# Patient Record
Sex: Female | Born: 2000
Health system: Southern US, Community
[De-identification: ages and names within clinical notes are randomized; demographics above are authoritative.]

## PROBLEM LIST (undated history)

## (undated) ENCOUNTER — Inpatient Hospital Stay (HOSPITAL_COMMUNITY): Payer: Self-pay

## (undated) DIAGNOSIS — G43909 Migraine, unspecified, not intractable, without status migrainosus: Secondary | ICD-10-CM

## (undated) DIAGNOSIS — R55 Syncope and collapse: Secondary | ICD-10-CM

## (undated) DIAGNOSIS — F419 Anxiety disorder, unspecified: Secondary | ICD-10-CM

## (undated) DIAGNOSIS — Z9889 Other specified postprocedural states: Secondary | ICD-10-CM

## (undated) DIAGNOSIS — F32A Depression, unspecified: Secondary | ICD-10-CM

## (undated) DIAGNOSIS — I498 Other specified cardiac arrhythmias: Secondary | ICD-10-CM

## (undated) DIAGNOSIS — F410 Panic disorder [episodic paroxysmal anxiety] without agoraphobia: Secondary | ICD-10-CM

## (undated) DIAGNOSIS — J45909 Unspecified asthma, uncomplicated: Secondary | ICD-10-CM

## (undated) DIAGNOSIS — G90A Postural orthostatic tachycardia syndrome (POTS): Secondary | ICD-10-CM

## (undated) DIAGNOSIS — F988 Other specified behavioral and emotional disorders with onset usually occurring in childhood and adolescence: Secondary | ICD-10-CM

## (undated) DIAGNOSIS — H539 Unspecified visual disturbance: Secondary | ICD-10-CM

## (undated) DIAGNOSIS — E282 Polycystic ovarian syndrome: Secondary | ICD-10-CM

## (undated) DIAGNOSIS — F909 Attention-deficit hyperactivity disorder, unspecified type: Secondary | ICD-10-CM

## (undated) HISTORY — DX: Migraine, unspecified, not intractable, without status migrainosus: G43.909

## (undated) HISTORY — PX: ROUX-EN-Y GASTRIC BYPASS: SHX1104

## (undated) HISTORY — PX: ESOPHAGOGASTRODUODENOSCOPY: SHX1529

## (undated) HISTORY — DX: Other specified behavioral and emotional disorders with onset usually occurring in childhood and adolescence: F98.8

## (undated) HISTORY — PX: OVARIAN CYST SURGERY: SHX726

## (undated) HISTORY — DX: Syncope and collapse: R55

---
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
## 2018-08-29 DIAGNOSIS — Z23 Encounter for immunization: Secondary | ICD-10-CM | POA: Diagnosis not present

## 2018-09-30 DIAGNOSIS — R062 Wheezing: Secondary | ICD-10-CM | POA: Diagnosis not present

## 2018-09-30 DIAGNOSIS — R05 Cough: Secondary | ICD-10-CM | POA: Diagnosis not present

## 2018-09-30 DIAGNOSIS — J309 Allergic rhinitis, unspecified: Secondary | ICD-10-CM | POA: Diagnosis not present

## 2018-12-22 DIAGNOSIS — Z68.41 Body mass index (BMI) pediatric, greater than or equal to 95th percentile for age: Secondary | ICD-10-CM | POA: Diagnosis not present

## 2018-12-22 DIAGNOSIS — E282 Polycystic ovarian syndrome: Secondary | ICD-10-CM | POA: Diagnosis not present

## 2018-12-22 DIAGNOSIS — R635 Abnormal weight gain: Secondary | ICD-10-CM | POA: Diagnosis not present

## 2019-03-23 DIAGNOSIS — J45909 Unspecified asthma, uncomplicated: Secondary | ICD-10-CM | POA: Insufficient documentation

## 2019-05-25 DIAGNOSIS — Z7689 Persons encountering health services in other specified circumstances: Secondary | ICD-10-CM | POA: Diagnosis not present

## 2019-05-25 DIAGNOSIS — R9431 Abnormal electrocardiogram [ECG] [EKG]: Secondary | ICD-10-CM | POA: Diagnosis not present

## 2019-05-25 DIAGNOSIS — Z6841 Body Mass Index (BMI) 40.0 and over, adult: Secondary | ICD-10-CM | POA: Diagnosis not present

## 2019-05-25 DIAGNOSIS — J45909 Unspecified asthma, uncomplicated: Secondary | ICD-10-CM | POA: Diagnosis not present

## 2019-05-25 DIAGNOSIS — Z7189 Other specified counseling: Secondary | ICD-10-CM | POA: Diagnosis not present

## 2019-05-25 DIAGNOSIS — Z713 Dietary counseling and surveillance: Secondary | ICD-10-CM | POA: Diagnosis not present

## 2019-05-25 DIAGNOSIS — Z01818 Encounter for other preprocedural examination: Secondary | ICD-10-CM | POA: Diagnosis not present

## 2019-05-25 DIAGNOSIS — Z79899 Other long term (current) drug therapy: Secondary | ICD-10-CM | POA: Diagnosis not present

## 2019-05-25 DIAGNOSIS — E282 Polycystic ovarian syndrome: Secondary | ICD-10-CM | POA: Diagnosis not present

## 2019-05-25 DIAGNOSIS — Z7984 Long term (current) use of oral hypoglycemic drugs: Secondary | ICD-10-CM | POA: Diagnosis not present

## 2019-06-01 DIAGNOSIS — Z713 Dietary counseling and surveillance: Secondary | ICD-10-CM | POA: Diagnosis not present

## 2019-06-01 DIAGNOSIS — F4320 Adjustment disorder, unspecified: Secondary | ICD-10-CM | POA: Diagnosis not present

## 2019-06-01 DIAGNOSIS — Z6841 Body Mass Index (BMI) 40.0 and over, adult: Secondary | ICD-10-CM | POA: Diagnosis not present

## 2019-07-20 DIAGNOSIS — Z01818 Encounter for other preprocedural examination: Secondary | ICD-10-CM | POA: Diagnosis not present

## 2019-07-20 DIAGNOSIS — Z1159 Encounter for screening for other viral diseases: Secondary | ICD-10-CM | POA: Diagnosis not present

## 2019-07-20 DIAGNOSIS — Z5329 Procedure and treatment not carried out because of patient's decision for other reasons: Secondary | ICD-10-CM | POA: Diagnosis not present

## 2019-07-23 DIAGNOSIS — Z7984 Long term (current) use of oral hypoglycemic drugs: Secondary | ICD-10-CM | POA: Diagnosis not present

## 2019-07-23 DIAGNOSIS — K2950 Unspecified chronic gastritis without bleeding: Secondary | ICD-10-CM | POA: Diagnosis not present

## 2019-07-23 DIAGNOSIS — Z01818 Encounter for other preprocedural examination: Secondary | ICD-10-CM | POA: Diagnosis not present

## 2019-07-23 DIAGNOSIS — E282 Polycystic ovarian syndrome: Secondary | ICD-10-CM | POA: Diagnosis not present

## 2019-07-23 DIAGNOSIS — Z79899 Other long term (current) drug therapy: Secondary | ICD-10-CM | POA: Diagnosis not present

## 2019-08-10 DIAGNOSIS — G4730 Sleep apnea, unspecified: Secondary | ICD-10-CM | POA: Diagnosis not present

## 2019-08-10 DIAGNOSIS — Z79899 Other long term (current) drug therapy: Secondary | ICD-10-CM | POA: Diagnosis not present

## 2019-08-10 DIAGNOSIS — R9431 Abnormal electrocardiogram [ECG] [EKG]: Secondary | ICD-10-CM | POA: Diagnosis not present

## 2019-08-10 DIAGNOSIS — I252 Old myocardial infarction: Secondary | ICD-10-CM | POA: Diagnosis not present

## 2019-08-10 DIAGNOSIS — E538 Deficiency of other specified B group vitamins: Secondary | ICD-10-CM | POA: Diagnosis not present

## 2019-08-10 DIAGNOSIS — K2950 Unspecified chronic gastritis without bleeding: Secondary | ICD-10-CM | POA: Diagnosis not present

## 2019-08-10 DIAGNOSIS — E559 Vitamin D deficiency, unspecified: Secondary | ICD-10-CM | POA: Diagnosis not present

## 2019-08-10 DIAGNOSIS — Z01818 Encounter for other preprocedural examination: Secondary | ICD-10-CM | POA: Diagnosis not present

## 2019-08-10 DIAGNOSIS — Z713 Dietary counseling and surveillance: Secondary | ICD-10-CM | POA: Diagnosis not present

## 2019-08-10 DIAGNOSIS — Z6841 Body Mass Index (BMI) 40.0 and over, adult: Secondary | ICD-10-CM | POA: Diagnosis not present

## 2019-08-18 DIAGNOSIS — Z20828 Contact with and (suspected) exposure to other viral communicable diseases: Secondary | ICD-10-CM | POA: Diagnosis not present

## 2019-08-18 DIAGNOSIS — Z01818 Encounter for other preprocedural examination: Secondary | ICD-10-CM | POA: Diagnosis not present

## 2019-08-21 DIAGNOSIS — Z79899 Other long term (current) drug therapy: Secondary | ICD-10-CM | POA: Diagnosis not present

## 2019-08-21 DIAGNOSIS — R339 Retention of urine, unspecified: Secondary | ICD-10-CM | POA: Diagnosis not present

## 2019-08-21 DIAGNOSIS — Z793 Long term (current) use of hormonal contraceptives: Secondary | ICD-10-CM | POA: Diagnosis not present

## 2019-08-21 DIAGNOSIS — Z6841 Body Mass Index (BMI) 40.0 and over, adult: Secondary | ICD-10-CM | POA: Diagnosis not present

## 2019-08-21 DIAGNOSIS — J45909 Unspecified asthma, uncomplicated: Secondary | ICD-10-CM | POA: Diagnosis not present

## 2019-08-21 DIAGNOSIS — Z7984 Long term (current) use of oral hypoglycemic drugs: Secondary | ICD-10-CM | POA: Diagnosis not present

## 2019-08-21 DIAGNOSIS — Z01818 Encounter for other preprocedural examination: Secondary | ICD-10-CM | POA: Diagnosis not present

## 2019-08-21 DIAGNOSIS — E282 Polycystic ovarian syndrome: Secondary | ICD-10-CM | POA: Diagnosis not present

## 2019-09-10 DIAGNOSIS — Z23 Encounter for immunization: Secondary | ICD-10-CM | POA: Diagnosis not present

## 2019-09-14 DIAGNOSIS — F4320 Adjustment disorder, unspecified: Secondary | ICD-10-CM | POA: Diagnosis not present

## 2019-09-14 DIAGNOSIS — Z6841 Body Mass Index (BMI) 40.0 and over, adult: Secondary | ICD-10-CM | POA: Diagnosis not present

## 2019-09-14 DIAGNOSIS — E282 Polycystic ovarian syndrome: Secondary | ICD-10-CM | POA: Diagnosis not present

## 2019-09-14 DIAGNOSIS — Z713 Dietary counseling and surveillance: Secondary | ICD-10-CM | POA: Diagnosis not present

## 2019-09-14 DIAGNOSIS — Z9884 Bariatric surgery status: Secondary | ICD-10-CM | POA: Diagnosis not present

## 2019-09-14 DIAGNOSIS — Z48815 Encounter for surgical aftercare following surgery on the digestive system: Secondary | ICD-10-CM | POA: Diagnosis not present

## 2019-09-29 DIAGNOSIS — Z309 Encounter for contraceptive management, unspecified: Secondary | ICD-10-CM | POA: Diagnosis not present

## 2019-10-27 DIAGNOSIS — Z01419 Encounter for gynecological examination (general) (routine) without abnormal findings: Secondary | ICD-10-CM | POA: Diagnosis not present

## 2019-10-27 DIAGNOSIS — R55 Syncope and collapse: Secondary | ICD-10-CM | POA: Diagnosis not present

## 2019-11-13 DIAGNOSIS — Z79899 Other long term (current) drug therapy: Secondary | ICD-10-CM | POA: Diagnosis not present

## 2019-11-13 DIAGNOSIS — Z713 Dietary counseling and surveillance: Secondary | ICD-10-CM | POA: Diagnosis not present

## 2019-11-13 DIAGNOSIS — E669 Obesity, unspecified: Secondary | ICD-10-CM | POA: Diagnosis not present

## 2019-11-13 DIAGNOSIS — Z9884 Bariatric surgery status: Secondary | ICD-10-CM | POA: Diagnosis not present

## 2019-11-13 DIAGNOSIS — K912 Postsurgical malabsorption, not elsewhere classified: Secondary | ICD-10-CM | POA: Diagnosis not present

## 2019-11-13 DIAGNOSIS — Z48815 Encounter for surgical aftercare following surgery on the digestive system: Secondary | ICD-10-CM | POA: Diagnosis not present

## 2019-11-13 DIAGNOSIS — Z6837 Body mass index (BMI) 37.0-37.9, adult: Secondary | ICD-10-CM | POA: Diagnosis not present

## 2019-11-30 ENCOUNTER — Encounter: Payer: Self-pay | Admitting: *Deleted

## 2019-12-01 ENCOUNTER — Other Ambulatory Visit: Payer: Self-pay

## 2019-12-01 ENCOUNTER — Telehealth: Payer: Self-pay | Admitting: Cardiology

## 2019-12-01 ENCOUNTER — Ambulatory Visit (INDEPENDENT_AMBULATORY_CARE_PROVIDER_SITE_OTHER): Payer: BC Managed Care – PPO | Admitting: Cardiology

## 2019-12-01 ENCOUNTER — Encounter: Payer: Self-pay | Admitting: Cardiology

## 2019-12-01 VITALS — BP 94/58 | HR 64 | Ht 64.0 in | Wt 214.2 lb

## 2019-12-01 DIAGNOSIS — R002 Palpitations: Secondary | ICD-10-CM

## 2019-12-01 DIAGNOSIS — Z87898 Personal history of other specified conditions: Secondary | ICD-10-CM

## 2019-12-01 NOTE — Telephone Encounter (Signed)
Pre-cert Verification for the following procedure    14 Day ZIO AT dx: syncope & palpitations

## 2019-12-01 NOTE — Progress Notes (Signed)
Cardiology Office Note  Date: 12/01/2019   ID: [name], [name] [birth year], MRN [id number]  PCP:  Rory Percy, MD  Cardiologist:  Rozann Lesches, MD Electrophysiologist:  None   Chief Complaint  Patient presents with  . History of syncope    History of Present Illness: Vickie Sutton is a 18 y.o. female referred for cardiology consultation by Vickie Sutton for the evaluation of syncope.  We discussed her symptoms.  She tells me that about a year ago she was taking a hot shower, standing up and had an episode of syncope where she fell backward.  Since that time she has had other episodes of lightheadedness and frank syncope that are more typically associated with a feeling of palpitations and pounding in her head, also nausea, exclusively while standing.  She has never had a sudden unexplained episode of syncope without prodrome, never while being seated or lying down.  When she has these episodes standing, she typically feels better when she sits down after a few minutes, if she tries to keep standing she has had syncope.  Records indicate Vickie Sutton at [facility] in August of this year.  She states that she lost about 50 pounds so far.  She tells me that she has done well with regular diet, trying to stay well-hydrated, but it sounds like her blood pressure has come down somewhat over time, she was not hypertensive previously oral medications however.  She does not report any recurring loose stools or diarrhea.  She also tells me that she has a special needs brother in his 41s who was diagnosed with WPW as a child and underwent ablation.  No other known family history of cardiac arrhythmia.  No one with sudden cardiac death.  Orthostatic vital signs were obtained today and unremarkable.  Supine blood pressure 108/67 with heart rate 66, seated blood pressure 109/74 with heart rate 68, standing blood pressure 108/73 with heart rate 74, and standing blood pressure after 3 minutes  110/71 with heart rate 73.  She is a Psychologist, counselling, works at the [employer] in [place], an [place].  Past Medical History:  Diagnosis Date  . ADD (attention deficit disorder)   . Asthma   . Polycystic ovarian syndrome     Past Surgical History:  Procedure Laterality Date  . ESOPHAGOGASTRODUODENOSCOPY    . Vickie Sutton     August 2020 - [facility]  . TOOTH EXTRACTION N/A 12/12/2015   Procedure: EXTRACTION MOLARS - teeth one, sixteen, seventeen and thirty-two;  Surgeon: Diona Browner, DDS;  Location: [facility];  Service: Oral Surgery;  Laterality: N/A;    Current Outpatient Medications  Medication Sig Dispense Refill  . albuterol (PROAIR HFA) 108 (90 BASE) MCG/ACT inhaler Inhale 2 puffs into the lungs 4 (four) times daily as needed for wheezing or shortness of breath.    . Calcium Carbonate (CALCIUM 600 PO) Take 1 tablet by mouth daily.    . clindamycin (CLINDAGEL) 1 % gel Apply 1 application topically daily as needed (acne).    . Cyanocobalamin (VITAMIN B12 PO) Take 1 tablet by mouth daily.    . Ferrous Sulfate (IRON PO) Take 1 tablet by mouth daily.    . Multiple Vitamin (MULTIVITAMIN) tablet Take 1 tablet by mouth daily.    . norethindrone-ethinyl estradiol 1/35 (NORTREL 1/35, 28,) tablet TAKE 1 TABLET BY MOUTH ONCE DAILY.    . pantoprazole (PROTONIX) 40 MG tablet Take by mouth.  No current facility-administered medications for this visit.    Allergies:  Patient has no known allergies.   Social History: The patient  reports that she has never smoked. She has never used smokeless tobacco. She reports that she does not drink alcohol or use drugs.   Family History: The patient's family history includes Diabetes Mellitus II in her maternal grandmother; Obesity in her mother.   ROS:  Please see the history of present illness. Otherwise, complete review of systems is positive for none.  All other systems are reviewed and negative.   Physical Exam: VS:   BP (!) 94/58   Pulse 64   Ht 5\' 4"  (1.626 m)   Wt 214 lb 3.2 oz (97.2 kg)   SpO2 99%   BMI 36.77 kg/m , BMI Body mass index is 36.77 kg/m.  Wt Readings from Last 3 Encounters:  12/01/19 214 lb 3.2 oz (97.2 kg)  12/12/15 208 lb (94.3 kg) (98 %, Z= 2.15)*  12/06/15 208 lb 12.8 oz (94.7 kg) (98 %, Z= 2.16)*   * Growth percentiles are based on CDC (Girls, 2-20 Years) data.    General: Patient appears comfortable at rest. HEENT: Conjunctiva and lids normal, wearing a mask. Neck: Supple, no elevated JVP or carotid bruits, no thyromegaly. Lungs: Clear to auscultation, nonlabored breathing at rest. Cardiac: Regular rate and rhythm, no S3 or significant systolic murmur, no pericardial rub. Abdomen: Soft, nontender, bowel sounds present. Extremities: No pitting edema, distal pulses 2+. Skin: Warm and dry. Musculoskeletal: No kyphosis. Neuropsychiatric: Alert and oriented x3, affect grossly appropriate.  ECG:  An ECG dated November 2020 was personally reviewed today and demonstrated:  Normal sinus rhythm with increased voltage, repolarization changes, normal QT interval.  Recent Labwork:  December 2019: Hemoglobin A1c 5.4%, TSH 2.07, BUN 9, creatinine 0.68, potassium 4.4, AST 31, ALT 29, hemoglobin 14.1, platelets 279 August 2020: Hemoglobin 11.7, platelets 219, potassium 3.6, BUN 6, creatinine 0.6, magnesium 1.02 June 2019: Cholesterol 207, LDL 126, HDL 47, triglycerides 170, TSH 1.92  Other Studies Reviewed Today:  No prior cardiac testing for review.  Assessment and Plan:  Recurrent orthostatic syncope as discussed above.  Description seems more consistent with neurocardiogenic syncope than an arrhythmia.  Orthostatic vital signs were normal today, although she was asymptomatic at that time.  She does seem to light run a low normal blood pressure and it may be that she is prone to syncope with dips in blood pressure while standing depending on overall volume status.  She has lost 50  pounds since bariatric surgery in August.  She states that her brother was diagnosed with Wolff-Parkinson-White syndrome and underwent an ablation, no other obvious family history of arrhythmia or sudden cardiac death.  I reviewed her recent ECG which did not show evidence of preexcitation and her QT interval was normal.  Plan is to obtain a 14-day Zio patch in an effort to exclude obvious arrhythmia during episodes.  I otherwise discussed with her maintaining a well-hydrated state and liberalizing salt in her diet somewhat.  Medication Adjustments/Labs and Tests Ordered: Current medicines are reviewed at length with the patient today.  Concerns regarding medicines are outlined above.   Tests Ordered: Orders Placed This Encounter  Procedures  . LONG TERM MONITOR (3-14 DAYS)    Medication Changes: No orders of the defined types were placed in this encounter.   Disposition:  Follow up test results.  Signed, Satira Sark, MD, [facility] 12/01/2019 3:42 PM    [facility] [address], [place], [place] [address] Phone: [phone]; Fax: [phone]

## 2019-12-01 NOTE — Patient Instructions (Addendum)
Medication Instructions:   Your physician recommends that you continue on your current medications as directed. Please refer to the Current Medication list given to you today.  Labwork:  NONE  Testing/Procedures: Your physician has recommended that you wear an event monitor for 14 days. Event monitors are medical devices that record the heart's electrical activity. Doctors most often [place] these monitors to diagnose arrhythmias. Arrhythmias are problems with the speed or rhythm of the heartbeat. The monitor is a small, portable device. You can wear one while you do your normal daily activities. This is usually used to diagnose what is causing palpitations/syncope (passing out). [employer] will send the ZIO monitor to your home address.  Follow-Up:  Your physician recommends that you schedule a follow-up appointment in: pending.  Any Other Special Instructions Will Be Listed Below (If Applicable).  Please stay hydrated by drinking plenty of water.   If you need a refill on your cardiac medications before your next appointment, please call your pharmacy.

## 2019-12-08 ENCOUNTER — Telehealth: Payer: Self-pay | Admitting: Cardiology

## 2019-12-08 NOTE — Telephone Encounter (Signed)
Patient can not afford to have monitor. Insurance told her they would not pay due to not having heart issues before.    She would like to know is there something else that can be done for monitor?   Can not answer phone from 8:00-4:30 due to working

## 2019-12-08 NOTE — Telephone Encounter (Signed)
Per patient, billing department at [facility] request we re-enroll patient as self pay.

## 2019-12-08 NOTE — Telephone Encounter (Signed)
Message left on vm for patient to call [employer] @1 [phone] opt 1 to request patient assistance. Advised to call office back with their response.

## 2019-12-10 ENCOUNTER — Telehealth: Payer: Self-pay | Admitting: Cardiology

## 2019-12-10 ENCOUNTER — Encounter: Payer: Self-pay | Admitting: Cardiology

## 2019-12-10 NOTE — Telephone Encounter (Signed)
Patient called asking status of monitor being mailed out. She stated she had another episode this morning at work that was pretty significant.

## 2019-12-10 NOTE — Telephone Encounter (Signed)
Patient contacted and wanted to check on the status of her heart monitor. Advised that she was re-enrolled as self pay and ZIO site has her monitor in an in transit to patient status.  Reports having episode at work this morning including feeling like she was going to pass out with rapid respiration and elevated HR. Advised patient that if her symptoms return or or get worse, that she needed to go to the ED for an evaluation. Also advised that she could contact [employer] to track monitor. Verbalized understanding of plan.

## 2019-12-11 DIAGNOSIS — Z20828 Contact with and (suspected) exposure to other viral communicable diseases: Secondary | ICD-10-CM | POA: Diagnosis not present

## 2019-12-14 ENCOUNTER — Telehealth: Payer: Self-pay | Admitting: Cardiology

## 2019-12-14 DIAGNOSIS — R002 Palpitations: Secondary | ICD-10-CM

## 2019-12-14 DIAGNOSIS — Z87898 Personal history of other specified conditions: Secondary | ICD-10-CM

## 2019-12-14 NOTE — Telephone Encounter (Signed)
Attempt to reach, got voicemail

## 2019-12-14 NOTE — Telephone Encounter (Signed)
Pt says BP this morning 131/79 HR 117 - says she is SOB/lightheaded that goes away after sitting down a few minutes - says the SOB is worse when she is active doing chores - has not started Zio monitor is awaiting shipment - says symptoms were ongoing over the weekend - denies chest pain

## 2019-12-14 NOTE — Telephone Encounter (Signed)
Patient called stating that she started having shortness of breath again. States that she was putting up dishes on Sunday 12/13/2019 abd became very short of breath. States that her BP 179/69 Pulse 137 taken on 12-20 at 6pm

## 2019-12-14 NOTE — Telephone Encounter (Signed)
Need heart monitor as planned to exclude arrhythmia. Please also get 24 urine collection for metanephrines and normetanephrines since she is reporting spikes in blood pressure and heart rate.

## 2019-12-15 DIAGNOSIS — R002 Palpitations: Secondary | ICD-10-CM | POA: Diagnosis not present

## 2019-12-16 ENCOUNTER — Ambulatory Visit (INDEPENDENT_AMBULATORY_CARE_PROVIDER_SITE_OTHER): Payer: BC Managed Care – PPO

## 2019-12-16 DIAGNOSIS — R002 Palpitations: Secondary | ICD-10-CM | POA: Diagnosis not present

## 2019-12-16 DIAGNOSIS — Z87898 Personal history of other specified conditions: Secondary | ICD-10-CM

## 2019-12-16 NOTE — Telephone Encounter (Signed)
Pt will go to [facility] lab for 24 hr urine

## 2020-01-08 ENCOUNTER — Telehealth: Payer: Self-pay | Admitting: *Deleted

## 2020-01-08 NOTE — Telephone Encounter (Signed)
-----   Message from Satira Sark, MD sent at 01/06/2020  1:05 PM EST ----- Results reviewed.  Rhythm overall looks to be sinus (normal) although she does have periods of fairly rapid tachycardia.  Although SVT is not specifically noted, this is difficult to exclude when heart rates her in the 150-190 range at times.  We have sent urine for metanephrines and normetanephrines.  Please get follow-up on this and schedule an office visit to discuss her symptoms and determine the next step.

## 2020-01-08 NOTE — Telephone Encounter (Signed)
Patient informed and verbalized understanding of plan Has not done 24 urine but will go on Monday, January 18, [address]-order faxed.

## 2020-01-19 ENCOUNTER — Telehealth: Payer: Self-pay | Admitting: *Deleted

## 2020-01-19 NOTE — Telephone Encounter (Signed)
Patient informed. Copy sent to PCP °

## 2020-01-19 NOTE — Telephone Encounter (Signed)
-----   Message from Satira Sark, MD sent at 01/19/2020  9:55 AM EST ----- Results reviewed.  24-hour urine metanephrines and normetanephrines are within normal range.  This argues against pheochromocytoma - wanted to exclude this rare possibility in light of reported spikes in heart rate.

## 2020-02-02 ENCOUNTER — Encounter: Payer: Self-pay | Admitting: Cardiology

## 2020-02-02 ENCOUNTER — Ambulatory Visit (INDEPENDENT_AMBULATORY_CARE_PROVIDER_SITE_OTHER): Payer: 59 | Admitting: Cardiology

## 2020-02-02 VITALS — BP 101/64 | HR 75 | Temp 98.7°F | Ht 64.0 in | Wt 197.0 lb

## 2020-02-02 DIAGNOSIS — Z87898 Personal history of other specified conditions: Secondary | ICD-10-CM | POA: Diagnosis not present

## 2020-02-02 NOTE — Progress Notes (Signed)
Cardiology Office Note  Date: 02/02/2020   ID: Verneta, Crosthwaite 2001/05/29, MRN [id number]  PCP:  Rory Percy, MD  Cardiologist:  Rozann Lesches, MD Electrophysiologist:  None   Chief Complaint  Patient presents with  . Cardiac follow-up    History of Present Illness: Vickie Sutton is a 19 y.o. female seen in consultation back in December 2020 for the evaluation of orthostatic syncope.  She presents today for follow-up.  Continues to work at the [employer].  She reports that the prior episodes of near syncope and syncope have improved in terms of intensity, but she still has symptoms from time to time.  Generally these are better when she sits and calms down.  She wore a cardiac monitor and ultimately had 24-hour urine collection for metanephrines and normetanephrines.  No definite arrhythmias were noted but her peak heart rates do get as fast as the 150s to 190s.  Catecholamine levels were normal.  Orthostatic vital signs were normal at the initial encounter.  I wonder whether she has any component of autonomic dysfunction contributing to her symptoms.  She is also status post Roux-en-Y gastric bypass at [facility] and has follow-up pending there.  She does not report any abdominal cramping or diarrhea.  Past Medical History:  Diagnosis Date  . ADD (attention deficit disorder)   . Asthma   . Polycystic ovarian syndrome     Past Surgical History:  Procedure Laterality Date  . ESOPHAGOGASTRODUODENOSCOPY    . ROUX-EN-Y GASTRIC BYPASS     August 2020 - [facility]  . TOOTH EXTRACTION N/A 12/12/2015   Procedure: EXTRACTION MOLARS - teeth one, sixteen, seventeen and thirty-two;  Surgeon: Diona Browner, DDS;  Location: [facility];  Service: Oral Surgery;  Laterality: N/A;    Current Outpatient Medications  Medication Sig Dispense Refill  . albuterol (PROAIR HFA) 108 (90 BASE) MCG/ACT inhaler Inhale 2 puffs into the lungs 4 (four) times daily as needed for wheezing or shortness of breath.     . clindamycin (CLINDAGEL) 1 % gel Apply 1 application topically daily as needed (acne).    . Cyanocobalamin (VITAMIN B12 PO) Take 1 tablet by mouth daily.    . Ferrous Sulfate (IRON PO) Take 1 tablet by mouth daily.    . Multiple Vitamin (MULTIVITAMIN) tablet Take 1 tablet by mouth daily.    . norethindrone-ethinyl estradiol 1/35 (NORTREL 1/35, 28,) tablet TAKE 1 TABLET BY MOUTH ONCE DAILY.    . pantoprazole (PROTONIX) 40 MG tablet Take by mouth.     No current facility-administered medications for this visit.   Allergies:  Patient has no known allergies.   ROS: No frank syncope since last visit.  Physical Exam: VS:  BP 101/64   Pulse 75   Temp 98.7 F (37.1 C)   Ht 5\' 4"  (1.626 m)   Wt 197 lb (89.4 kg)   BMI 33.81 kg/m , BMI Body mass index is 33.81 kg/m.  Wt Readings from Last 3 Encounters:  02/02/20 197 lb (89.4 kg)  12/01/19 214 lb 3.2 oz (97.2 kg)  12/12/15 208 lb (94.3 kg) (98 %, Z= 2.15)*   * Growth percentiles are based on CDC (Girls, 2-20 Years) data.    General: Patient appears comfortable at rest. HEENT: Conjunctiva and lids normal, wearing a mask. Neck: Supple, no elevated JVP or carotid bruits, no thyromegaly. Lungs: Clear to auscultation, nonlabored breathing at rest. Cardiac: Regular rate and rhythm, no S3 or significant systolic murmur, no pericardial rub. Extremities:  No pitting edema, distal pulses 2+.  ECG:  An ECG dated November 2020 was personally reviewed today and demonstrated:  Normal sinus rhythm with increased voltage, repolarization changes, normal QT interval.  Recent Labwork:  January 2021: 24-hour urine metanephrines and normetanephrines normal range.  Other Studies Reviewed Today:  Cardiac monitor 12/28/2019: Zio patch reviewed.  11 days 9 hours analyzed.  Predominant rhythm is sinus with heart rate ranging from 44 bpm up to 199 bpm and average heart rate 74 bpm.  Rare PACs and PVCs were noted representing less than 1% of total beats.   Fastest episodes of tachycardia are potentially sinus, although cannot completely exclude SVT.  There does not look to be an abrupt transition from normal to very rapid heart rate however based on available information.  Assessment and Plan:  History of orthostatic syncope.  Orthostatic vital signs were noncontributory at initial encounter.  She has had recurring spells in the absence of syncope, tends to feel better when she sits down and relaxes.  I wonder whether she has any component of autonomic dysfunction.  Cardiac monitor did not clearly demonstrate PSVT, but her fastest heart rates do get into the 150s to 190s.  Urinary catecholamine levels were normal.  I talked with her about general hydration, avoiding any obvious triggers.  We will obtain an echocardiogram for baseline assessment of cardiac structure and function, but do not anticipate further cardiac testing at this point.  Continue observation.  Medication Adjustments/Labs and Tests Ordered: Current medicines are reviewed at length with the patient today.  Concerns regarding medicines are outlined above.   Tests Ordered: Orders Placed This Encounter  Procedures  . ECHOCARDIOGRAM COMPLETE    Medication Changes: No orders of the defined types were placed in this encounter.   Disposition:  Follow up 6 months in the [place] office.  Signed, Satira Sark, MD, [facility] 02/02/2020 4:01 PM    [facility] at [facility] [address] [place], [place] [address] Phone: [phone]; Fax: [phone]

## 2020-02-02 NOTE — Patient Instructions (Signed)
Medication Instructions:  Your physician recommends that you continue on your current medications as directed. Please refer to the Current Medication list given to you today.  *If you need a refill on your cardiac medications before your next appointment, please call your pharmacy*  Lab Work: None If you have labs (blood work) drawn today and your tests are completely normal, you will receive your results only by: Marland Kitchen MyChart Message (if you have MyChart) OR . A paper copy in the mail If you have any lab test that is abnormal or we need to change your treatment, we will call you to review the results.  Testing/Procedures: Your physician has requested that you have an echocardiogram. Echocardiography is a painless test that uses sound waves to create images of your heart. It provides your doctor with information about the size and shape of your heart and how well your heart's chambers and valves are working. This procedure takes approximately one hour. There are no restrictions for this procedure.    Follow-Up: At [facility], you and your health needs are our priority.  As part of our continuing mission to provide you with exceptional heart care, we have created designated Provider Care Teams.  These Care Teams include your primary Cardiologist (physician) and Advanced Practice Providers (APPs -  Physician Assistants and Nurse Practitioners) who all work together to provide you with the care you need, when you need it.  Your next appointment:   6 month(s)  The format for your next appointment:   In Person  Provider:   Rozann Lesches, MD  Other Instructions None Thank you for choosing [facility] !

## 2020-02-09 ENCOUNTER — Other Ambulatory Visit: Payer: Self-pay

## 2020-02-09 ENCOUNTER — Ambulatory Visit (HOSPITAL_COMMUNITY)
Admission: RE | Admit: 2020-02-09 | Discharge: 2020-02-09 | Disposition: A | Discharge status: Home / Self Care | Payer: 59 | Source: Ambulatory Visit | Attending: Cardiology | Admitting: Cardiology

## 2020-02-09 DIAGNOSIS — Z87898 Personal history of other specified conditions: Secondary | ICD-10-CM | POA: Diagnosis present

## 2020-02-09 NOTE — Progress Notes (Signed)
*  PRELIMINARY RESULTS* Echocardiogram 2D Echocardiogram has been performed by Leavy Cella, RDCS.   Vickie Sutton 02/09/2020, 3:21 PM

## 2020-02-11 ENCOUNTER — Telehealth: Payer: Self-pay | Admitting: *Deleted

## 2020-02-11 NOTE — Telephone Encounter (Signed)
-----   Message from Satira Sark, MD sent at 02/09/2020  4:31 PM EST ----- Results reviewed.  Echocardiogram is reassuring with normal LVEF at 55% and no significant valvular abnormalities.  Continue with same follow-up plan.

## 2020-02-11 NOTE — Telephone Encounter (Signed)
Called patient with test results. No answer. Left message to call back.  

## 2020-02-24 ENCOUNTER — Telehealth: Payer: Self-pay | Admitting: Cardiology

## 2020-02-24 NOTE — Telephone Encounter (Signed)
Pt aware that we did not order any kind of MRI - pt received letter from her insurance saying that she had MRI at [facility] and this wasn't a bill but stating that the claim was denied - pt aware to wait until she received a bill from [facility] for echo and then if anything was incorrect the billing department could investigate this. Will also have [facility] assistance application mailed to pt per request

## 2020-02-24 NOTE — Telephone Encounter (Signed)
Wanting to talk to Nurse about why he ordered a test MRI done on 02/05/20  and reason why he ordered.

## 2020-04-26 ENCOUNTER — Encounter: Payer: Self-pay | Admitting: *Deleted

## 2020-04-26 ENCOUNTER — Telehealth: Payer: Self-pay | Admitting: Cardiology

## 2020-04-26 DIAGNOSIS — Z6830 Body mass index (BMI) 30.0-30.9, adult: Secondary | ICD-10-CM | POA: Diagnosis not present

## 2020-04-26 DIAGNOSIS — R55 Syncope and collapse: Secondary | ICD-10-CM | POA: Diagnosis not present

## 2020-04-26 DIAGNOSIS — F419 Anxiety disorder, unspecified: Secondary | ICD-10-CM | POA: Diagnosis not present

## 2020-04-26 NOTE — Telephone Encounter (Signed)
Pt went to [facility] on Sunday - says she didn't feel well laid her head down on the kitchen table and then passed out - said when she woke up she could not talk correctly/slurred speech/muscle tension/SOB - ambulance was called and taken to [facility] - says her HR/BP were low - says MRI or CT scan/echo/CXR were done they did not admit her she was referred to neurology as well - pt was seen by pcp today who recommended pt have evaluation with cardiology as well - scheduled with Katina Dung, NP on 5/11 - requested records from [facility]

## 2020-04-26 NOTE — Telephone Encounter (Signed)
Went to [employer] over the weekend due to passing out.  Would like to know if she needs to be seen sooner

## 2020-05-02 NOTE — Progress Notes (Signed)
Cardiology Office Note  Date: 05/03/2020   ID: Vickie Sutton, Vickie Sutton Jun 15, 2001, MRN [id number]  PCP:  Rory Percy, MD  Cardiologist:  Rozann Lesches, MD Electrophysiologist:  None   Chief Complaint: Follow-up  Orthostatic syncope  History of Present Illness: Vickie Sutton is a 19 y.o. female with a history of orthostatic syncope.  Last encounter 02/02/2020 with Dr. Domenic Polite.  She had recurring spells in the absence of syncope and tended to feel better when she sat down and relaxed.  Cardiac monitor did not clearly show evidence of PSVT.  Her heart rate did go up to the 150s to 190s.  Her urine catecholamines were normal.  Echocardiogram was ordered.  There was question as to whether she may have some component of autonomic dysfunction.  Patient called the office on 04/26/2020 stating she went to the emergency room at [facility] recently due to not feeling well.  States she laid her head on the kitchen table and passed out.  States when she woke she could not talk correctly with slurred speech, muscle tension, shortness of breath.  She stated her heart rate and blood pressure both were low.  She had diagnostic testing and was referred to neurology.  Her systolic blood pressures were in the 90s on arrival to the emergency room via EMS.  Notes from [facility] stated during her syncopal episode fell backwards and hit her head.  She had a CT scan of the head and repeat neurologic exam which were normal.  After she received IV fluids the patient was no longer orthostatic.  Her cardiac enzymes were negative  Orthostatic vital signs taken during her recent emergency room presentation showed a lying blood pressure of 92/65 with a heart rate of 78.  Sitting blood pressure 93/61 with heart rate of 109.  Standing blood pressure of 106/65 with a standing pulse of 120.  Patient had previously stated her feelings of orthostasis were relieved when she sat down and relaxed.  Patient states she  continues to feel dizzy most of the time.  Still denies any syncopal episodes but has felt near syncopal a few times.  She states the symptoms appear worse after she is taking a hot shower.  She denies any other symptoms in the interim.  She does note she has had some blood in her stools today and believes she has some bleeding internal hemorrhoids.  Her hemoglobin recent hospital stay was 15.4.  She has a pending neuro appointment for her symptoms.    Past Medical History:  Diagnosis Date  . ADD (attention deficit disorder)   . Asthma   . Polycystic ovarian syndrome     Past Surgical History:  Procedure Laterality Date  . ESOPHAGOGASTRODUODENOSCOPY    . ROUX-EN-Y GASTRIC BYPASS     August 2020 - [facility]  . TOOTH EXTRACTION N/A 12/12/2015   Procedure: EXTRACTION MOLARS - teeth one, sixteen, seventeen and thirty-two;  Surgeon: Diona Browner, DDS;  Location: [facility];  Service: Oral Surgery;  Laterality: N/A;    Current Outpatient Medications  Medication Sig Dispense Refill  . albuterol (PROAIR HFA) 108 (90 BASE) MCG/ACT inhaler Inhale 2 puffs into the lungs 4 (four) times daily as needed for wheezing or shortness of breath.    . Cyanocobalamin (VITAMIN B12 PO) Take 1 tablet by mouth daily.    . Ferrous Sulfate (IRON PO) Take 1 tablet by mouth daily.    Marland Kitchen FLUoxetine (PROZAC) 20 MG capsule Take 20 mg by mouth daily.    Marland Kitchen  Multiple Vitamin (MULTIVITAMIN) tablet Take 1 tablet by mouth daily.    . Zinc 100 MG TABS Take 1 tablet by mouth daily.    . metoprolol succinate (TOPROL XL) 25 MG 24 hr tablet Take 0.5 tablets (12.5 mg total) by mouth daily. 15 tablet 6   No current facility-administered medications for this visit.   Allergies:  Patient has no known allergies.   Social History: The patient  reports that she has never smoked. She has never used smokeless tobacco. She reports that she does not drink alcohol or use drugs.   Family History: The patient's family history includes Diabetes  Mellitus II in her maternal grandmother; Heart disease in her maternal grandmother; Obesity in her mother; Yves Dill Parkinson White syndrome in her brother.   ROS:  Please see the history of present illness. Otherwise, complete review of systems is positive for none.  All other systems are reviewed and negative.   Physical Exam: VS:  BP 100/68   Pulse 72   Ht 5\' 5"  (1.651 m)   Wt 176 lb 9.6 oz (80.1 kg)   BMI 29.39 kg/m , BMI Body mass index is 29.39 kg/m.  Wt Readings from Last 3 Encounters:  05/03/20 176 lb 9.6 oz (80.1 kg)  02/02/20 197 lb (89.4 kg)  12/01/19 214 lb 3.2 oz (97.2 kg)    General: Patient appears comfortable at rest. Neck: Supple, no elevated JVP or carotid bruits, no thyromegaly. Lungs: Clear to auscultation, nonlabored breathing at rest. Cardiac: Regular rate and rhythm, no S3 or significant systolic murmur, no pericardial rub. Extremities: No pitting edema, distal pulses 2+. Skin: Warm and dry. Musculoskeletal: No kyphosis. Neuropsychiatric: Alert and oriented x3, affect grossly appropriate.  ECG:    Recent Labwork: No results found for requested labs within last 8760 hours.  No results found for: CHOL, TRIG, HDL, CHOLHDL, VLDL, LDLCALC, LDLDIRECT  Other Studies Reviewed Today:  Echocardiogram 02/09/2020  1. Left ventricular ejection fraction, by estimation, is 55%. The left ventricle has normal function. The left ventricle has no regional wall motion abnormalities. Left ventricular diastolic parameters were normal. 2. Right ventricular systolic function is normal. The right ventricular size is normal. There is normal pulmonary artery systolic pressure. The estimated right ventricular systolic pressure is 123XX123 mmHg. 3. The mitral valve is grossly normal. No evidence of mitral valve regurgitation. 4. The aortic valve is tricuspid. Aortic valve regurgitation is not visualized. 5. The inferior vena cava is normal in size with greater than 50% respiratory  variability, suggesting right atrial pressure of 3 mmHg.  Cardiac monitor 12/28/2019: Zio patch reviewed. 11 days 9 hours analyzed. Predominant rhythm is sinus with heart rate ranging from 44 bpm up to 199 bpm and average heart rate 74 bpm. Rare PACs and PVCs were noted representing less than 1% of total beats. Fastest episodes of tachycardia are potentially sinus, although cannot completely exclude SVT. There does not look to be an abrupt transition from normal to very rapid heart rate however based on available information.    Assessment and Plan:  1. History of syncope   2. POTS (postural orthostatic tachycardia syndrome)    1. History of syncope Patient's had no further episodes of syncope since recent emergency room visit on 04/24/2020 but states she feels dizzy most of the time.  She does state she has felt near syncopal on a couple occasions since recent hospital visit.  2.. Suspicion of POTS Patient has a pending neuro appointment.  Her blood pressures visit  stay  showed increases in blood pressure from lying, sitting.  To standing.  She also had increases in heart rate from a lying heart rate of 78 up to a heart rate of 120 when standing.  Start low-dose Toprol 12.5 mg daily.  Discussed with patient increasing her fluid intake up to at least 3 L/day and increasing sodium intake as well as increasing exercise to help with this issue.  We will follow-up in 2 months after her neuro appointment.   Medication Adjustments/Labs and Tests Ordered: Current medicines are reviewed at length with the patient today.  Concerns regarding medicines are outlined above.   Disposition: Follow-up with Dr Domenic Polite or APP 2 months.  Signed, Levell July, NP 05/03/2020 11:19 AM    [place] [facility] [place], [place],  [address] Phone: [phone]; Fax: [phone]

## 2020-05-03 ENCOUNTER — Other Ambulatory Visit: Payer: Self-pay

## 2020-05-03 ENCOUNTER — Encounter: Payer: Self-pay | Admitting: *Deleted

## 2020-05-03 ENCOUNTER — Encounter: Payer: Self-pay | Admitting: Family Medicine

## 2020-05-03 ENCOUNTER — Ambulatory Visit (INDEPENDENT_AMBULATORY_CARE_PROVIDER_SITE_OTHER): Payer: BLUE CROSS/BLUE SHIELD | Admitting: Family Medicine

## 2020-05-03 VITALS — BP 100/68 | HR 72 | Ht 65.0 in | Wt 176.6 lb

## 2020-05-03 DIAGNOSIS — Z87898 Personal history of other specified conditions: Secondary | ICD-10-CM | POA: Diagnosis not present

## 2020-05-03 DIAGNOSIS — I498 Other specified cardiac arrhythmias: Secondary | ICD-10-CM

## 2020-05-03 DIAGNOSIS — G90A Postural orthostatic tachycardia syndrome (POTS): Secondary | ICD-10-CM

## 2020-05-03 MED ORDER — METOPROLOL SUCCINATE ER 25 MG PO TB24: 12.5000 mg | ORAL_TABLET | Freq: Every day | ORAL | 6 refills | Status: DC

## 2020-05-03 NOTE — Patient Instructions (Signed)
Medication Instructions:   Begin Toprol XL 12.5mg  daily.   Continue all other medications.    Labwork: none  Testing/Procedures: none  Follow-Up: 2 months   Any Other Special Instructions Will Be Listed Below (If Applicable). Increase fluids, salt intake, and exercise.    If you need a refill on your cardiac medications before your next appointment, please call your pharmacy.

## 2020-05-10 ENCOUNTER — Telehealth: Payer: Self-pay | Admitting: Family Medicine

## 2020-05-10 DIAGNOSIS — F419 Anxiety disorder, unspecified: Secondary | ICD-10-CM | POA: Diagnosis not present

## 2020-05-10 DIAGNOSIS — I498 Other specified cardiac arrhythmias: Secondary | ICD-10-CM | POA: Diagnosis not present

## 2020-05-10 NOTE — Telephone Encounter (Signed)
Patient came to office with a letter from Dr. Leisa Lenz at [facility] whom sent a letter to her employer Ms Theodoro Doing, [employer] that stated he recommend that patient be restricted from work that included driving and use of hazardous machinery and couldn't predict duration of restriction.   Advised once again, that our office did not restrict her and that she needed to contact this provider for an explanation. Verbalized understanding.

## 2020-05-10 NOTE — Telephone Encounter (Signed)
Patient called stating that her employer let her go due to a letter that was sent to her employer.She needs to speak with someone in regards to what does she need to do about filing disability.She worked for [employer].   [phone]  (please call patient after 1000am)

## 2020-05-10 NOTE — Telephone Encounter (Signed)
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 Says she was told by her HR department physician that due to notes from her cardiologist that she has a lot of restrictions. Advised that our records don't indicate that she was taken out of work nor shows she was placed on restrictions per last visit notes. Advised to contact her HR department to get more details as to which specific provider placed her on restrictions and contact our office with that information. Verbalized understanding.

## 2020-06-07 ENCOUNTER — Ambulatory Visit: Payer: BLUE CROSS/BLUE SHIELD | Admitting: Neurology

## 2020-06-20 ENCOUNTER — Telehealth: Payer: Self-pay | Admitting: *Deleted

## 2020-06-20 ENCOUNTER — Telehealth: Payer: Self-pay | Admitting: Cardiology

## 2020-06-20 NOTE — Telephone Encounter (Signed)
Our office has not specifically restricted Ms. Tribbett's activity at work.  She has a history of syncope that is felt to be related to autonomic dysfunction, possible postural orthostatic tachycardia syndrome.  She had no specific arrhythmias documented by cardiac monitor.  We have recommended low-dose beta-blocker, adequate hydration, and regular exercise which has been shown to be beneficial in the management of POTS.  I would say that she should be allowed regular breaks and rest when appropriate, also the ability to avoid extremes of temperature.

## 2020-06-20 NOTE — Telephone Encounter (Signed)
Reports recently finding out that she is 2 months pregnant and doesn't have her first OB/Gyn visit until 07/15/20. Please advise if its safe to take toprol xl 12.5 mg daily while pregnant.

## 2020-06-20 NOTE — Telephone Encounter (Signed)
I did not realize that she was now pregnant, thank you for the update.  I think in that case I would probably stop the low-dose beta-blocker as the safest option focusing on other measures for treatment of POTS such as regular activity and adequate hydration.  As a backup plan labetalol could be considered.

## 2020-06-20 NOTE — Telephone Encounter (Signed)
Patient informed and verbalized understanding of plan. 

## 2020-06-20 NOTE — Telephone Encounter (Signed)
Pt came into the office requesting a letter stating that her cardiologists doesn't have any restrictions on her working.

## 2020-06-20 NOTE — Telephone Encounter (Signed)
Labetalol is the preferred beta blocker in pregnancy due to better safety profile, however metoprolol can be used.  Will defer to MD regarding risk/benefit of continuing low dose beta blocker during pregnancy for POTS.

## 2020-06-21 NOTE — Telephone Encounter (Signed)
Patient informed and verbalized understanding of plan. 

## 2020-06-22 ENCOUNTER — Telehealth: Payer: Self-pay | Admitting: Cardiology

## 2020-06-22 NOTE — Telephone Encounter (Signed)
Advised that message was left prior to our last conversation and no new messages were left.  Verbalized understanding.

## 2020-06-22 NOTE — Telephone Encounter (Signed)
Returning Vickie Sutton's call

## 2020-07-04 NOTE — Progress Notes (Deleted)
Cardiology Office Note  Date: 07/04/2020   ID: Vickie Sutton, Vickie Sutton 03/17/2001, MRN [id number]  PCP:  Rory Percy, MD  Cardiologist:  Rozann Lesches, MD Electrophysiologist:  None   Chief Complaint: Follow-up  Orthostatic syncope  History of Present Illness: Vickie Sutton is a 19 y.o. female with a history of orthostatic syncope.  Previous encounter 02/02/2020 with Dr. Domenic Polite.  She had recurring spells in the absence of syncope and tended to feel better when she sat down and relaxed.  Cardiac monitor did not clearly show evidence of PSVT.  Her heart rate did go up to the 150s to 190s.  Her urine catecholamines were normal.  Echocardiogram was ordered.  There was question as to whether she may have some component of autonomic dysfunction.  Patient called the office on 04/26/2020 stating she went to the emergency room at [facility] recently due to not feeling well.  Stated she laid her head on the kitchen table and passed out.  Stated when she woke she could not talk correctly with slurred speech, muscle tension, shortness of breath.  She stated her heart rate and blood pressure both were low.  She had diagnostic testing and was referred to neurology.  Her systolic blood pressures were in the 90s on arrival to the emergency room via EMS.  Notes from [facility] stated during her syncopal episode fell backwards and hit her head.  She had a CT scan of the head and repeat neurologic exam which were normal.  After she received IV fluids the patient was no longer orthostatic.  Her cardiac enzymes were negative  Orthostatic vital signs taken during the emergency room presentation showed a lying blood pressure of 92/65 with a heart rate of 78.  Sitting blood pressure 93/61 with heart rate of 109.  Standing blood pressure of 106/65 with a standing pulse of 120.  Patient had previously stated her feelings of orthostasis were relieved when she sat down and relaxed.  At last vist she continued to  feel dizzy most of the time.  Still denied any syncopal episodes but had felt near syncopal a few times.  She stated the symptoms appeared worse after taking a hot shower.  She denied any other symptoms in the interim.  She had a pending neuro appointment for her symptoms.  Recently discovered she was pregnant. Beta blocker was discontinued.    Past Medical History:  Diagnosis Date  . ADD (attention deficit disorder)   . Asthma   . Polycystic ovarian syndrome     Past Surgical History:  Procedure Laterality Date  . ESOPHAGOGASTRODUODENOSCOPY    . ROUX-EN-Y GASTRIC BYPASS     August 2020 - [facility]  . TOOTH EXTRACTION N/A 12/12/2015   Procedure: EXTRACTION MOLARS - teeth one, sixteen, seventeen and thirty-two;  Surgeon: Diona Browner, DDS;  Location: [place];  Service: Oral Surgery;  Laterality: N/A;    Current Outpatient Medications  Medication Sig Dispense Refill  . albuterol (PROAIR HFA) 108 (90 BASE) MCG/ACT inhaler Inhale 2 puffs into the lungs 4 (four) times daily as needed for wheezing or shortness of breath.    . Cyanocobalamin (VITAMIN B12 PO) Take 1 tablet by mouth daily.    . Ferrous Sulfate (IRON PO) Take 1 tablet by mouth daily.    Marland Kitchen FLUoxetine (PROZAC) 20 MG capsule Take 20 mg by mouth every other day.     . Multiple Vitamin (MULTIVITAMIN) tablet Take 1 tablet by mouth daily.    Marland Kitchen  Zinc 100 MG TABS Take 1 tablet by mouth daily.     No current facility-administered medications for this visit.   Allergies:  Patient has no known allergies.   Social History: The patient  reports that she has never smoked. She has never used smokeless tobacco. She reports that she does not drink alcohol and does not use drugs.   Family History: The patient's family history includes Diabetes Mellitus II in her maternal grandmother; Heart disease in her maternal grandmother; Obesity in her mother; Yves Dill Parkinson White syndrome in her brother.   ROS:  Please see the history of present illness.  Otherwise, complete review of systems is positive for none.  All other systems are reviewed and negative.   Physical Exam: VS:  There were no vitals taken for this visit., BMI There is no height or weight on file to calculate BMI.  Wt Readings from Last 3 Encounters:  05/03/20 176 lb 9.6 oz (80.1 kg)  02/02/20 197 lb (89.4 kg)  12/01/19 214 lb 3.2 oz (97.2 kg)    General: Patient appears comfortable at rest. Neck: Supple, no elevated JVP or carotid bruits, no thyromegaly. Lungs: Clear to auscultation, nonlabored breathing at rest. Cardiac: Regular rate and rhythm, no S3 or significant systolic murmur, no pericardial rub. Extremities: No pitting edema, distal pulses 2+. Skin: Warm and dry. Musculoskeletal: No kyphosis. Neuropsychiatric: Alert and oriented x3, affect grossly appropriate.  ECG:    Recent Labwork: No results found for requested labs within last 8760 hours.  No results found for: CHOL, TRIG, HDL, CHOLHDL, VLDL, LDLCALC, LDLDIRECT  Other Studies Reviewed Today:  Echocardiogram 02/09/2020  1. Left ventricular ejection fraction, by estimation, is 55%. The left ventricle has normal function. The left ventricle has no regional wall motion abnormalities. Left ventricular diastolic parameters were normal. 2. Right ventricular systolic function is normal. The right ventricular size is normal. There is normal pulmonary artery systolic pressure. The estimated right ventricular systolic pressure is 62.6 mmHg. 3. The mitral valve is grossly normal. No evidence of mitral valve regurgitation. 4. The aortic valve is tricuspid. Aortic valve regurgitation is not visualized. 5. The inferior vena cava is normal in size with greater than 50% respiratory variability, suggesting right atrial pressure of 3 mmHg.  Cardiac monitor 12/28/2019: Zio patch reviewed. 11 days 9 hours analyzed. Predominant rhythm is sinus with heart rate ranging from 44 bpm up to 199 bpm and average heart rate 74  bpm. Rare PACs and PVCs were noted representing less than 1% of total beats. Fastest episodes of tachycardia are potentially sinus, although cannot completely exclude SVT. There does not look to be an abrupt transition from normal to very rapid heart rate however based on available information.    Assessment and Plan:   1. History of syncope Patient's had no further episodes of syncope since recent emergency room visit on 04/24/2020 but states she feels dizzy most of the time.  She does state she has felt near syncopal on a couple occasions since recent hospital visit.  2.. Suspicion of POTS Patient has a pending neuro appointment.  Her blood pressures visit  stay showed increases in blood pressure from lying, sitting.  To standing.  She also had increases in heart rate from a lying heart rate of 78 up to a heart rate of 120 when standing.  Start low-dose Toprol 12.5 mg daily.  Discussed with patient increasing her fluid intake up to at least 3 L/day and increasing sodium intake as well as increasing  exercise to help with this issue.  We will follow-up in 2 months after her neuro appointment.   Medication Adjustments/Labs and Tests Ordered: Current medicines are reviewed at length with the patient today.  Concerns regarding medicines are outlined above.   Disposition: Follow-up with Dr Domenic Polite or APP 2 months.  Signed, Levell July, NP 07/04/2020 9:49 PM    [facility] at [place], [place], [place] [address] Phone: [phone]; Fax: [phone]

## 2020-07-05 ENCOUNTER — Ambulatory Visit: Payer: BLUE CROSS/BLUE SHIELD | Admitting: Family Medicine

## 2020-07-12 LAB — OB RESULTS CONSOLE GC/CHLAMYDIA
Chlamydia: NEGATIVE
Gonorrhea: NEGATIVE

## 2020-07-12 LAB — OB RESULTS CONSOLE RUBELLA ANTIBODY, IGM: Rubella: IMMUNE

## 2020-07-12 LAB — OB RESULTS CONSOLE HIV ANTIBODY (ROUTINE TESTING): HIV: NONREACTIVE

## 2020-07-12 LAB — OB RESULTS CONSOLE ANTIBODY SCREEN: Antibody Screen: NEGATIVE

## 2020-07-12 LAB — OB RESULTS CONSOLE RPR: RPR: NONREACTIVE

## 2020-07-12 LAB — OB RESULTS CONSOLE HEPATITIS B SURFACE ANTIGEN: Hepatitis B Surface Ag: NEGATIVE

## 2020-07-12 LAB — OB RESULTS CONSOLE ABO/RH: RH Type: POSITIVE

## 2020-07-12 LAB — SICKLE CELL SCREEN: Sickle Cell Screen: NEGATIVE

## 2020-07-15 ENCOUNTER — Ambulatory Visit: Payer: BLUE CROSS/BLUE SHIELD | Admitting: Family Medicine

## 2020-07-15 NOTE — Progress Notes (Deleted)
Cardiology Office Note  Date: 07/15/2020   ID: Gwynn, Chalker 2001/05/17, MRN [id number]  PCP:  Rory Percy, MD  Cardiologist:  Rozann Lesches, MD Electrophysiologist:  None   Chief Complaint: Follow-up  Orthostatic syncope  History of Present Illness: Vickie Sutton is a 19 y.o. female with a history of orthostatic syncope.  Previous encounter 02/02/2020 with Dr. Domenic Polite.  She had recurring spells in the absence of syncope and tended to feel better when she sat down and relaxed.  Cardiac monitor did not clearly show evidence of PSVT.  Her heart rate did go up to the 150s to 190s.  Her urine catecholamines were normal.  Echocardiogram was ordered.  There was question as to whether she may have some component of autonomic dysfunction.  Patient called the office on 04/26/2020 stating she went to the emergency room at [facility] recently due to not feeling well.  Stated she laid her head on the kitchen table and passed out.  Stated when she woke she could not talk correctly with slurred speech, muscle tension, shortness of breath.  She stated her heart rate and blood pressure both were low.  She had diagnostic testing and was referred to neurology.  Her systolic blood pressures were in the 90s on arrival to the emergency room via EMS.  Notes from [facility] stated during her syncopal episode fell backwards and hit her head.  She had a CT scan of the head and repeat neurologic exam which were normal.  After she received IV fluids the patient was no longer orthostatic.  Her cardiac enzymes were negative  Orthostatic vital signs taken during the emergency room presentation showed a lying blood pressure of 92/65 with a heart rate of 78.  Sitting blood pressure 93/61 with heart rate of 109.  Standing blood pressure of 106/65 with a standing pulse of 120.  Patient had previously stated her feelings of orthostasis were relieved when she sat down and relaxed.  At last vist she continued to  feel dizzy most of the time.  Still denied any syncopal episodes but had felt near syncopal a few times.  She stated the symptoms appeared worse after taking a hot shower.  She denied any other symptoms in the interim.  She had a pending neuro appointment for her symptoms.  Recently discovered she was pregnant. Beta blocker was discontinued.    Past Medical History:  Diagnosis Date  . ADD (attention deficit disorder)   . Asthma   . Polycystic ovarian syndrome     Past Surgical History:  Procedure Laterality Date  . ESOPHAGOGASTRODUODENOSCOPY    . ROUX-EN-Y GASTRIC BYPASS     August 2020 - [facility]  . TOOTH EXTRACTION N/A 12/12/2015   Procedure: EXTRACTION MOLARS - teeth one, sixteen, seventeen and thirty-two;  Surgeon: Diona Browner, DDS;  Location: [facility];  Service: Oral Surgery;  Laterality: N/A;    Current Outpatient Medications  Medication Sig Dispense Refill  . albuterol (PROAIR HFA) 108 (90 BASE) MCG/ACT inhaler Inhale 2 puffs into the lungs 4 (four) times daily as needed for wheezing or shortness of breath.    . Cyanocobalamin (VITAMIN B12 PO) Take 1 tablet by mouth daily.    . Ferrous Sulfate (IRON PO) Take 1 tablet by mouth daily.    Marland Kitchen FLUoxetine (PROZAC) 20 MG capsule Take 20 mg by mouth every other day.     . Multiple Vitamin (MULTIVITAMIN) tablet Take 1 tablet by mouth daily.    Marland Kitchen  Zinc 100 MG TABS Take 1 tablet by mouth daily.     No current facility-administered medications for this visit.   Allergies:  Patient has no known allergies.   Social History: The patient  reports that she has never smoked. She has never used smokeless tobacco. She reports that she does not drink alcohol and does not use drugs.   Family History: The patient's family history includes Diabetes Mellitus II in her maternal grandmother; Heart disease in her maternal grandmother; Obesity in her mother; Yves Dill Parkinson White syndrome in her brother.   ROS:  Please see the history of present illness.  Otherwise, complete review of systems is positive for none.  All other systems are reviewed and negative.   Physical Exam: VS:  There were no vitals taken for this visit., BMI There is no height or weight on file to calculate BMI.  Wt Readings from Last 3 Encounters:  05/03/20 176 lb 9.6 oz (80.1 kg)  02/02/20 197 lb (89.4 kg)  12/01/19 214 lb 3.2 oz (97.2 kg)    General: Patient appears comfortable at rest. Neck: Supple, no elevated JVP or carotid bruits, no thyromegaly. Lungs: Clear to auscultation, nonlabored breathing at rest. Cardiac: Regular rate and rhythm, no S3 or significant systolic murmur, no pericardial rub. Extremities: No pitting edema, distal pulses 2+. Skin: Warm and dry. Musculoskeletal: No kyphosis. Neuropsychiatric: Alert and oriented x3, affect grossly appropriate.  ECG:    Recent Labwork: No results found for requested labs within last 8760 hours.  No results found for: CHOL, TRIG, HDL, CHOLHDL, VLDL, LDLCALC, LDLDIRECT  Other Studies Reviewed Today:  Echocardiogram 02/09/2020  1. Left ventricular ejection fraction, by estimation, is 55%. The left ventricle has normal function. The left ventricle has no regional wall motion abnormalities. Left ventricular diastolic parameters were normal. 2. Right ventricular systolic function is normal. The right ventricular size is normal. There is normal pulmonary artery systolic pressure. The estimated right ventricular systolic pressure is 04.5 mmHg. 3. The mitral valve is grossly normal. No evidence of mitral valve regurgitation. 4. The aortic valve is tricuspid. Aortic valve regurgitation is not visualized. 5. The inferior vena cava is normal in size with greater than 50% respiratory variability, suggesting right atrial pressure of 3 mmHg.  Cardiac monitor 12/28/2019: Zio patch reviewed. 11 days 9 hours analyzed. Predominant rhythm is sinus with heart rate ranging from 44 bpm up to 199 bpm and average heart rate 74  bpm. Rare PACs and PVCs were noted representing less than 1% of total beats. Fastest episodes of tachycardia are potentially sinus, although cannot completely exclude SVT. There does not look to be an abrupt transition from normal to very rapid heart rate however based on available information.    Assessment and Plan:   1. History of syncope Patient's had no further episodes of syncope since recent emergency room visit on 04/24/2020 but states she feels dizzy most of the time.  She does state she has felt near syncopal on a couple occasions since recent hospital visit.  2.. Suspicion of POTS Patient has a pending neuro appointment.  Her blood pressures visit   showed increases in blood pressure from lying, sitting, to standing.  She also had increases in heart rate from a lying heart rate of 78 up to a heart rate of 120 when standing.  Start low-dose Toprol 12.5 mg daily.  Discussed with patient increasing her fluid intake up to at least 3 L/day and increasing sodium intake as well as increasing exercise  to help with this issue.  We will follow-up in 2 months after her neuro appointment.   Medication Adjustments/Labs and Tests Ordered: Current medicines are reviewed at length with the patient today.  Concerns regarding medicines are outlined above.   Disposition: Follow-up with Dr Domenic Polite or APP 2 months.  Signed, Levell July, NP 07/15/2020 3:16 AM    [facility] [place], [place], [place] [address] Phone: [phone]; Fax: [phone]

## 2020-07-25 ENCOUNTER — Ambulatory Visit (INDEPENDENT_AMBULATORY_CARE_PROVIDER_SITE_OTHER): Payer: 59 | Admitting: Neurology

## 2020-07-25 ENCOUNTER — Encounter: Payer: Self-pay | Admitting: Neurology

## 2020-07-25 VITALS — Ht 65.0 in | Wt 167.0 lb

## 2020-07-25 DIAGNOSIS — R55 Syncope and collapse: Secondary | ICD-10-CM | POA: Diagnosis not present

## 2020-07-25 NOTE — Progress Notes (Signed)
HISTORICAL  Vickie Sutton is of 19 year old female, seen in request by her primary care PA Kassie Mends H, for evaluation of passing out spells, initial evaluation was on July 25, 2020, she is accompanied by her primary care physician Dr. Rory Percy  I reviewed and summarized the referring note.  Past medical history Anxiety, was previously treated with Prozac, not taking it anymore, Currently [redacted] weeks pregnant, expected due date on February 15, 2021, History of morbid obesity, bariatric surgery in August 2021, with 100 pound weight loss.  She reported intermittent passing out spells since 2019, when she was overweight she seems to have occasionally occurrence, increased occurrence following her bariatric surgery in August 2021, was having that couple times each months  Most severe episode happened on Apr 24, 2020, leading to [facility] emergency department admission, she was at her boyfriend's home, just finished eating her lunch, kneeling down writing notes for few minutes, then she felt dizziness sensation, she was able to tell her boyfriend she is going to pass out, then had transient loss of consciousness, paramedic was called, when she came around, she had slurred speech, went into panic mode, hyperventilation, I was able to review emergency department record on Apr 24, 2020, she presented with dizzy, paramedic reported patient has fallen and struck her head, she was hyperventilating on their examination,  Upon emergency room presentation, blood pressure was 94-1 06/52-65, heart rate 64-81,  Laboratory evaluations CMP, creatinine was normal 0.69, there was decreased carbon dioxide 19.6, elevated anion gap of 21, negative troponin, normal CBC, hemoglobin of 15.4,  She was treated with IV fluid, and the lorazepam  Chest x-ray was normal  CT head without contrast  She was seen by cardiologist at the end of 2020, EKG showed no significant abnormality November 2020, patient was told  she had a high resting heart rate, she was started on Toprol since May 2021, which has helped her frequent heart palpitation, fainting sensation, and will start after doing well recent pregnancy  Echocardiogram in February 2021, showed normal ejection fraction, no significant abnormality,  Prior to her bariatric surgery, when she was overweight up to 300 pounds, she reported fainting spells, usually associated with hot tub bath,  She also reported occasionally headache, often lateralized with pounding, light noise sensitivity, passing out spells are usually followed by her typical migraine headaches, lasting for couple hours  REVIEW OF SYSTEMS: Full 14 system review of systems performed and notable only for as above All other review of systems were negative.  ALLERGIES: No Known Allergies  HOME MEDICATIONS: Current Outpatient Medications  Medication Sig Dispense Refill  . Docusate Sodium (STOOL SOFTENER LAXATIVE PO) Take by mouth.    . Ferrous Sulfate (IRON PO) Take 1 tablet by mouth daily.    . Polyethylene Glycol 3350 (MIRALAX PO) Take by mouth.    . Prenatal Vit-Fe Fumarate-FA (PRENATAL MULTIVITAMIN) TABS tablet Take 1 tablet by mouth daily at 12 noon.    Marland Kitchen albuterol (PROAIR HFA) 108 (90 BASE) MCG/ACT inhaler Inhale 2 puffs into the lungs 4 (four) times daily as needed for wheezing or shortness of breath. (Patient not taking: Reported on 07/25/2020)    . Cyanocobalamin (VITAMIN B12 PO) Take 1 tablet by mouth daily. (Patient not taking: Reported on 07/25/2020)    . FLUoxetine (PROZAC) 20 MG capsule Take 20 mg by mouth every other day.  (Patient not taking: Reported on 07/25/2020)    . Multiple Vitamin (MULTIVITAMIN) tablet Take 1 tablet by mouth daily. (  Patient not taking: Reported on 07/25/2020)    . Zinc 100 MG TABS Take 1 tablet by mouth daily. (Patient not taking: Reported on 07/25/2020)     No current facility-administered medications for this visit.    PAST MEDICAL HISTORY: Past Medical  History:  Diagnosis Date  . ADD (attention deficit disorder)   . Asthma   . Polycystic ovarian syndrome     PAST SURGICAL HISTORY: Past Surgical History:  Procedure Laterality Date  . ESOPHAGOGASTRODUODENOSCOPY    . ROUX-EN-Y GASTRIC BYPASS     August 2020 - [facility]  . TOOTH EXTRACTION N/A 12/12/2015   Procedure: EXTRACTION MOLARS - teeth one, sixteen, seventeen and thirty-two;  Surgeon: Diona Browner, DDS;  Location: [facility];  Service: Oral Surgery;  Laterality: N/A;    FAMILY HISTORY: Family History  Problem Relation Age of Onset  . Obesity Mother   . Diabetes Mellitus II Maternal Grandmother   . Heart disease Maternal Grandmother   . Yves Dill Parkinson White syndrome Brother     SOCIAL HISTORY: Social History   Socioeconomic History  . Marital status: Single    Spouse name: Not on file  . Number of children: Not on file  . Years of education: Not on file  . Highest education level: Not on file  Occupational History  . Not on file  Tobacco Use  . Smoking status: Never Smoker  . Smokeless tobacco: Never Used  Substance and Sexual Activity  . Alcohol use: No  . Drug use: No  . Sexual activity: Not on file  Other Topics Concern  . Not on file  Social History Narrative  . Not on file   Social Determinants of Health   Financial Resource Strain:   . Difficulty of Paying Living Expenses:   Food Insecurity:   . Worried About Charity fundraiser in the Last Year:   . Arboriculturist in the Last Year:   Transportation Needs:   . Film/video editor (Medical):   Marland Kitchen Lack of Transportation (Non-Medical):   Physical Activity:   . Days of Exercise per Week:   . Minutes of Exercise per Session:   Stress:   . Feeling of Stress :   Social Connections:   . Frequency of Communication with Friends and Family:   . Frequency of Social Gatherings with Friends and Family:   . Attends Religious Services:   . Active Member of Clubs or Organizations:   . Attends Theatre manager Meetings:   Marland Kitchen Marital Status:   Intimate Partner Violence:   . Fear of Current or Ex-Partner:   . Emotionally Abused:   Marland Kitchen Physically Abused:   . Sexually Abused:      PHYSICAL EXAM   Vitals:   07/25/20 1044  SpO2: 98%  Weight: 167 lb (75.8 kg)  Height: 5\' 5"  (1.651 m)   Not recorded     Body mass index is 27.79 kg/m.  Blood pressure lying down 108/68, heart rate of 88, sitting 111/78, 91; standing 108/60, heart rate of 89  PHYSICAL EXAMNIATION:  Gen: NAD, conversant, well nourised, well groomed                     Cardiovascular: Regular rate rhythm, no peripheral edema, warm, nontender. Eyes: Conjunctivae clear without exudates or hemorrhage Neck: Supple, no carotid bruits. Pulmonary: Clear to auscultation bilaterally   NEUROLOGICAL EXAM:  MENTAL STATUS: Speech:    Speech is normal; fluent and spontaneous with normal comprehension.  Cognition:     Orientation to time, place and person     Normal recent and remote memory     Normal Attention span and concentration     Normal Language, naming, repeating,spontaneous speech     Fund of knowledge   CRANIAL NERVES: CN II: Visual fields are full to confrontation. Pupils are round equal and briskly reactive to light. CN III, IV, VI: extraocular movement are normal. No ptosis. CN V: Facial sensation is intact to light touch CN VII: Face is symmetric with normal eye closure  CN VIII: Hearing is normal to causal conversation. CN IX, X: Phonation is normal. CN XI: Head turning and shoulder shrug are intact  MOTOR: There is no pronator drift of out-stretched arms. Muscle bulk and tone are normal. Muscle strength is normal.  REFLEXES: Reflexes are 2+ and symmetric at the biceps, triceps, knees, and ankles. Plantar responses are flexor.  SENSORY: Intact to light touch, pinprick and vibratory sensation are intact in fingers and toes.  COORDINATION: There is no trunk or limb dysmetria  noted.  GAIT/STANCE: Posture is normal. Gait is steady with normal steps, base, arm swing, and turning. Heel and toe walking are normal. Tandem gait is normal.  Romberg is absent.   DIAGNOSTIC DATA (LABS, IMAGING, TESTING) - I reviewed patient records, labs, notes, testing and imaging myself where available.   ASSESSMENT AND PLAN  KIA VARNADORE is a 19 y.o. female   Passing out spells Currently [redacted] weeks pregnant, expected due date of February 15, 2021 Chronic migraine  Most consistent with presyncope/syncope  Which is also complicated by her anxiety, panic attack, hyperventilation,  Normal neurological examination,  I have suggested her increase water intake, moderate exercise, will not initiate any neurological evaluation at this point,  Return to clinic for new issues,   Marcial Pacas, M.D. Ph.D.  [facility] [address], [place], [place] [address] Ph: [phone] Fax: [phone]  CC:  Jalene Mullet, PA-C [facility] [address] [place],  [place] [address]  Rory Percy, MD

## 2020-08-01 ENCOUNTER — Telehealth: Payer: Self-pay | Admitting: Women's Health

## 2020-08-01 NOTE — Telephone Encounter (Signed)
Telephoned patient at home number. Patient states having trouble with allergies and using Tylenol and nasal spray. Advised patient to continue Tylenol, push fluids, and if starts running a temp of 100.4 or greater may want to get tested for COVID. Patient voiced understanding.

## 2020-08-01 NOTE — Telephone Encounter (Signed)
Patient called stating that she is pregnant and she has allergies and she has been running a small temp at 99.5, pt states that she has been taking Tylenol to bring down her temp. Pt would like to know if she is doing everything right or does she need an appointment. Please contact pt

## 2020-08-08 ENCOUNTER — Ambulatory Visit: Payer: BLUE CROSS/BLUE SHIELD | Admitting: *Deleted

## 2020-08-08 ENCOUNTER — Other Ambulatory Visit: Payer: Self-pay

## 2020-08-08 ENCOUNTER — Encounter: Payer: Self-pay | Admitting: Women's Health

## 2020-08-08 ENCOUNTER — Ambulatory Visit (INDEPENDENT_AMBULATORY_CARE_PROVIDER_SITE_OTHER): Payer: 59 | Admitting: Women's Health

## 2020-08-08 VITALS — BP 101/61 | HR 71 | Wt 170.0 lb

## 2020-08-08 DIAGNOSIS — Z331 Pregnant state, incidental: Secondary | ICD-10-CM

## 2020-08-08 DIAGNOSIS — Z3401 Encounter for supervision of normal first pregnancy, first trimester: Secondary | ICD-10-CM | POA: Diagnosis not present

## 2020-08-08 DIAGNOSIS — Z3682 Encounter for antenatal screening for nuchal translucency: Secondary | ICD-10-CM

## 2020-08-08 DIAGNOSIS — Z363 Encounter for antenatal screening for malformations: Secondary | ICD-10-CM

## 2020-08-08 DIAGNOSIS — E282 Polycystic ovarian syndrome: Secondary | ICD-10-CM | POA: Insufficient documentation

## 2020-08-08 DIAGNOSIS — Z3400 Encounter for supervision of normal first pregnancy, unspecified trimester: Secondary | ICD-10-CM | POA: Insufficient documentation

## 2020-08-08 DIAGNOSIS — F419 Anxiety disorder, unspecified: Secondary | ICD-10-CM | POA: Insufficient documentation

## 2020-08-08 DIAGNOSIS — Z1389 Encounter for screening for other disorder: Secondary | ICD-10-CM

## 2020-08-08 DIAGNOSIS — Z9884 Bariatric surgery status: Secondary | ICD-10-CM

## 2020-08-08 DIAGNOSIS — Z3A12 12 weeks gestation of pregnancy: Secondary | ICD-10-CM

## 2020-08-08 NOTE — Progress Notes (Signed)
[facility]  SUBJECTIVE:  Vickie Sutton is a 19 y.o. G1P0 female here for Panorama NIPT and Horizon Carrier Screening . She is [redacted]w[redacted]d pregnant.   OBJECTIVE:  Appears well, in no apparent distress  Blood work drawn from right [facility] without difficulty. 1 attempt(s).   ASSESSMENT: Pregnancy [redacted]w[redacted]d Panorama NIPT and Horizon Carrier Screening  PLAN: [facility] portal information given and instructed patient how to access results   Alice Rieger  08/08/2020 10:49 AM

## 2020-08-08 NOTE — Progress Notes (Signed)
INITIAL OBSTETRICAL VISIT Patient name: Vickie Sutton MRN [id number]  Date of birth: 2001/06/10 Chief Complaint:   Initial Prenatal Visit (transfer from [facility])  History of Present Illness:   Vickie Sutton is a 19 y.o. G1P0 Hispanic female at [redacted]w[redacted]d by [place] at 8 weeks with an Estimated Date of Delivery: 02/14/21 being seen today for her initial obstetrical visit with [place], transferring from [facility].   Her obstetrical history is significant for primigravida.   H/O PCOS H/O Roux-en-y bypass POTS- was taking metoprolol, stopped by cardiologist w/ +HPT, has f/u appt w/ them this Wed Anxiety- no meds, doing well Today she reports nausea in evening, declines meds.  Depression screen [facility] 2/9 08/08/2020  Decreased Interest 0  Down, Depressed, Hopeless 0  PHQ - 2 Score 0  Altered sleeping 0  Tired, decreased energy 2  Change in appetite 0  Feeling bad or failure about yourself  0  Trouble concentrating 0  Moving slowly or fidgety/restless 0  Suicidal thoughts 0  PHQ-9 Score 2    No LMP recorded. Patient is pregnant. Last pap never, turns 19yo in 4 days. Results were: n/a Review of Systems:   Pertinent items are noted in HPI Denies cramping/contractions, leakage of fluid, vaginal bleeding, abnormal vaginal discharge w/ itching/odor/irritation, headaches, visual changes, shortness of breath, chest pain, abdominal pain, severe nausea/vomiting, or problems with urination or bowel movements unless otherwise stated above.  Pertinent History Reviewed:  Reviewed past medical,surgical, social, obstetrical and family history.  Reviewed problem list, medications and allergies. OB History  Gravida Para Term Preterm AB Living  1            SAB TAB Ectopic Multiple Live Births               # Outcome Date GA Lbr Len/2nd Weight Sex Delivery Anes PTL Lv  1 Current            Physical Assessment:   Vitals:   08/08/20 1048  BP: 101/61  Pulse: 71  Weight: 170 lb (77.1 kg)  Body mass  index is 28.29 kg/m.       Physical Examination:  General appearance - well appearing, and in no distress  Mental status - alert, oriented to person, place, and time  Psych:  She has a normal mood and affect  Skin - warm and dry, normal color, no suspicious lesions noted  Chest - effort normal, all lung fields clear to auscultation bilaterally  Heart - normal rate and regular rhythm  Abdomen - soft, nontender  Extremities:  No swelling or varicosities noted  Thin prep pap is not done   TODAY'S FHR: 160 via doppler  No results found for this or any previous visit (from the past 24 hour(s)).  Assessment & Plan:  1) Low-Risk Pregnancy G1P0 at [redacted]w[redacted]d with an Estimated Date of Delivery: 02/14/21   2) Initial OB visit  3) POTS> metoprolol stopped by cardiology w/ +HPT, has f/u w/ them this Wed. This is also the only day we have available for NT u/s, discussed I recommended keeping cardiology appt, pt wishes to do NT u/s and reschedule cardiology appt  4) H/O Roux-en-Y  5) Anxiety> no meds, doing well  Meds: No orders of the defined types were placed in this encounter.   Initial labs reviewed, will get HepC w/ next lab draw Continue prenatal vitamins Reviewed n/v relief measures and warning s/s to report Reviewed recommended weight gain based on pre-gravid BMI Encouraged well-balanced diet  Genetic & carrier screening discussed: requests Panorama, NT/IT and Horizon 14  Ultrasound discussed; fetal survey: requested CCNC completed> form faxed if has or is planning to apply for medicaid The nature of [employer] for [employer] with multiple MDs and other Advanced Practice Providers was explained to patient; also emphasized that fellows, residents, and students are part of our team. Has home bp cuff.  Check bp weekly, let us know if >140/90.   Follow-up: Return for Wed 1st IT/NT (no visit), then 5wks from now (9/21) for anatomy u/s, 2nd IT and visit in person . w/  pap  Orders Placed This Encounter  Procedures  . US Fetal Nuchal Translucency Measurement  . US OB Comp + 14 Wk  . OB RESULTS CONSOLE GC/Chlamydia  . OB RESULTS CONSOLE RPR  . OB RESULTS CONSOLE HIV antibody  . OB RESULTS CONSOLE Rubella Antibody  . OB RESULTS CONSOLE Hepatitis B surface antigen  . Sickle cell screen  . Genetic Screening  . POC Urinalysis Dipstick OB  . OB RESULTS CONSOLE ABO/Rh  . OB RESULTS CONSOLE Antibody Screen    Roma Schanz CNM, [facility] 08/08/2020 11:32 AM

## 2020-08-08 NOTE — Patient Instructions (Signed)
Vickie Sutton, I greatly value your feedback.  If you receive a survey following your visit with [place] today, we appreciate you taking the time to fill it out.  Thanks, Vickie Sutton, CNM, WHNP-BC   [facility] [place] at [facility] ([place], [place] [address]) Entrance C, located off of [place] parking   Nausea & Vomiting  Have saltine crackers or pretzels by your bed and eat a few bites before you raise your head out of bed in the morning  Eat small frequent meals throughout the day instead of large meals  Drink plenty of fluids throughout the day to stay hydrated, just don't drink a lot of fluids with your meals.  This can make your stomach fill up faster making you feel sick  Do not brush your teeth right after you eat  Products with real ginger are good for nausea, like ginger ale and ginger hard candy Make sure it says made with real ginger!  Sucking on sour candy like lemon heads is also good for nausea  If your prenatal vitamins make you nauseated, take them at night so you will sleep through the nausea  Sea Bands  If you feel like you need medicine for the nausea & vomiting please let us know  If you are unable to keep any fluids or food down please let us know   Constipation  Drink plenty of fluid, preferably water, throughout the day  Eat foods high in fiber such as fruits, vegetables, and grains  Exercise, such as walking, is a good way to keep your bowels regular  Drink warm fluids, especially warm prune juice, or decaf coffee  Eat a 1/2 cup of real oatmeal (not instant), 1/2 cup applesauce, and 1/2-1 cup warm prune juice every day  If needed, you may take Colace (docusate sodium) stool softener once or twice a day to help keep the stool soft.   If you still are having problems with constipation, you may take Miralax once daily as needed to help keep your bowels regular.   Home Blood Pressure Monitoring for Patients    Your provider has recommended that you check your blood pressure (BP) at least once a week at home. If you do not have a blood pressure cuff at home, one will be provided for you. Contact your provider if you have not received your monitor within 1 week.   Helpful Tips for Accurate Home Blood Pressure Checks  . Don't smoke, exercise, or drink caffeine 30 minutes before checking your BP . Use the restroom before checking your BP (a full bladder can raise your pressure) . Relax in a comfortable upright chair . Feet on the ground . Left arm resting comfortably on a flat surface at the level of your heart . Legs uncrossed . Back supported . Sit quietly and don't talk . Place the cuff on your bare arm . Adjust snuggly, so that only two fingertips can fit between your skin and the top of the cuff . Check 2 readings separated by at least one minute . Keep a log of your BP readings . For a visual, please reference this diagram: http://ccnc.care/bpdiagram  Provider Name: [facility]     Phone: [phone]  Zone 1: ALL CLEAR  Continue to monitor your symptoms:  . BP reading is less than 140 (top number) or less than 90 (bottom number)  . No right upper stomach pain . No headaches or  seeing spots . No feeling nauseated or throwing up . No swelling in face and hands  Zone 2: CAUTION Call your doctor's office for any of the following:  . BP reading is greater than 140 (top number) or greater than 90 (bottom number)  . Stomach pain under your ribs in the middle or right side . Headaches or seeing spots . Feeling nauseated or throwing up . Swelling in face and hands  Zone 3: EMERGENCY  Seek immediate medical care if you have any of the following:  . BP reading is greater than160 (top number) or greater than 110 (bottom number) . Severe headaches not improving with Tylenol . Serious difficulty catching your breath . Any worsening symptoms from Zone 2    First Trimester of  Pregnancy The first trimester of pregnancy is from week 1 until the end of week 12 (months 1 through 3). A week after a sperm fertilizes an egg, the egg will implant on the wall of the uterus. This embryo will begin to develop into a baby. Genes from you and your partner are forming the baby. The female genes determine whether the baby is a boy or a girl. At 6-8 weeks, the eyes and face are formed, and the heartbeat can be seen on ultrasound. At the end of 12 weeks, all the baby's organs are formed.  Now that you are pregnant, you will want to do everything you can to have a healthy baby. Two of the most important things are to get good prenatal care and to follow your health care provider's instructions. Prenatal care is all the medical care you receive before the baby's birth. This care will help prevent, find, and treat any problems during the pregnancy and childbirth. BODY CHANGES Your body goes through many changes during pregnancy. The changes vary from woman to woman.   You may gain or lose a couple of pounds at first.  You may feel sick to your stomach (nauseous) and throw up (vomit). If the vomiting is uncontrollable, call your health care provider.  You may tire easily.  You may develop headaches that can be relieved by medicines approved by your health care provider.  You may urinate more often. Painful urination may mean you have a bladder infection.  You may develop heartburn as a result of your pregnancy.  You may develop constipation because certain hormones are causing the muscles that push waste through your intestines to slow down.  You may develop hemorrhoids or swollen, bulging veins (varicose veins).  Your breasts may begin to grow larger and become tender. Your nipples may stick out more, and the tissue that surrounds them (areola) may become darker.  Your gums may bleed and may be sensitive to brushing and flossing.  Dark spots or blotches (chloasma, mask of pregnancy)  may develop on your face. This will likely fade after the baby is born.  Your menstrual periods will stop.  You may have a loss of appetite.  You may develop cravings for certain kinds of food.  You may have changes in your emotions from day to day, such as being excited to be pregnant or being concerned that something may go wrong with the pregnancy and baby.  You may have more vivid and strange dreams.  You may have changes in your hair. These can include thickening of your hair, rapid growth, and changes in texture. Some women also have hair loss during or after pregnancy, or hair that feels dry or thin. Your  hair will most likely return to normal after your baby is born. WHAT TO EXPECT AT YOUR PRENATAL VISITS During a routine prenatal visit:  You will be weighed to make sure you and the baby are growing normally.  Your blood pressure will be taken.  Your abdomen will be measured to track your baby's growth.  The fetal heartbeat will be listened to starting around week 10 or 12 of your pregnancy.  Test results from any previous visits will be discussed. Your health care provider may ask you:  How you are feeling.  If you are feeling the baby move.  If you have had any abnormal symptoms, such as leaking fluid, bleeding, severe headaches, or abdominal cramping.  If you have any questions. Other tests that may be performed during your first trimester include:  Blood tests to find your blood type and to check for the presence of any previous infections. They will also be used to check for low iron levels (anemia) and Rh antibodies. Later in the pregnancy, blood tests for diabetes will be done along with other tests if problems develop.  Urine tests to check for infections, diabetes, or protein in the urine.  An ultrasound to confirm the proper growth and development of the baby.  An amniocentesis to check for possible genetic problems.  Fetal screens for spina bifida and  Down syndrome.  You may need other tests to make sure you and the baby are doing well. HOME CARE INSTRUCTIONS  Medicines  Follow your health care provider's instructions regarding medicine use. Specific medicines may be either safe or unsafe to take during pregnancy.  Take your prenatal vitamins as directed.  If you develop constipation, try taking a stool softener if your health care provider approves. Diet  Eat regular, well-balanced meals. Choose a variety of foods, such as meat or vegetable-based protein, fish, milk and low-fat dairy products, vegetables, fruits, and whole grain breads and cereals. Your health care provider will help you determine the amount of weight gain that is right for you.  Avoid raw meat and uncooked cheese. These carry germs that can cause birth defects in the baby.  Eating four or five small meals rather than three large meals a day may help relieve nausea and vomiting. If you start to feel nauseous, eating a few soda crackers can be helpful. Drinking liquids between meals instead of during meals also seems to help nausea and vomiting.  If you develop constipation, eat more high-fiber foods, such as fresh vegetables or fruit and whole grains. Drink enough fluids to keep your urine clear or pale yellow. Activity and Exercise  Exercise only as directed by your health care provider. Exercising will help you:  Control your weight.  Stay in shape.  Be prepared for labor and delivery.  Experiencing pain or cramping in the lower abdomen or low back is a good sign that you should stop exercising. Check with your health care provider before continuing normal exercises.  Try to avoid standing for long periods of time. Move your legs often if you must stand in one place for a long time.  Avoid heavy lifting.  Wear low-heeled shoes, and practice good posture.  You may continue to have sex unless your health care provider directs you otherwise. Relief of Pain  or Discomfort  Wear a good support bra for breast tenderness.    Take warm sitz baths to soothe any pain or discomfort caused by hemorrhoids. Use hemorrhoid cream if your health care provider  approves.    Rest with your legs elevated if you have leg cramps or low back pain.  If you develop varicose veins in your legs, wear support hose. Elevate your feet for 15 minutes, 3-4 times a day. Limit salt in your diet. Prenatal Care  Schedule your prenatal visits by the twelfth week of pregnancy. They are usually scheduled monthly at first, then more often in the last 2 months before delivery.  Write down your questions. Take them to your prenatal visits.  Keep all your prenatal visits as directed by your health care provider. Safety  Wear your seat belt at all times when driving.  Make a list of emergency phone numbers, including numbers for family, friends, the hospital, and police and fire departments. General Tips  Ask your health care provider for a referral to a local prenatal education class. Begin classes no later than at the beginning of month 6 of your pregnancy.  Ask for help if you have counseling or nutritional needs during pregnancy. Your health care provider can offer advice or refer you to specialists for help with various needs.  Do not use hot tubs, steam rooms, or saunas.  Do not douche or use tampons or scented sanitary pads.  Do not cross your legs for long periods of time.  Avoid cat litter boxes and soil used by cats. These carry germs that can cause birth defects in the baby and possibly loss of the fetus by miscarriage or stillbirth.  Avoid all smoking, herbs, alcohol, and medicines not prescribed by your health care provider. Chemicals in these affect the formation and growth of the baby.  Schedule a dentist appointment. At home, brush your teeth with a soft toothbrush and be gentle when you floss. SEEK MEDICAL CARE IF:   You have dizziness.  You have mild  pelvic cramps, pelvic pressure, or nagging pain in the abdominal area.  You have persistent nausea, vomiting, or diarrhea.  You have a bad smelling vaginal discharge.  You have pain with urination.  You notice increased swelling in your face, hands, legs, or ankles. SEEK IMMEDIATE MEDICAL CARE IF:   You have a fever.  You are leaking fluid from your vagina.  You have spotting or bleeding from your vagina.  You have severe abdominal cramping or pain.  You have rapid weight gain or loss.  You vomit blood or material that looks like coffee grounds.  You are exposed to [place] measles and have never had them.  You are exposed to fifth disease or chickenpox.  You develop a severe headache.  You have shortness of breath.  You have any kind of trauma, such as from a fall or a car accident. Document Released: 12/04/2001 Document Revised: 04/26/2014 Document Reviewed: 10/20/2013 [facility] Patient Information 2015 [place], [place]. This information is not intended to replace advice given to you by your health care provider. Make sure you discuss any questions you have with your health care provider.

## 2020-08-10 ENCOUNTER — Other Ambulatory Visit: Payer: BLUE CROSS/BLUE SHIELD

## 2020-08-10 ENCOUNTER — Ambulatory Visit: Payer: 59 | Admitting: Cardiology

## 2020-08-10 NOTE — Progress Notes (Signed)
Cardiology Office Note  Date: 08/11/2020   ID: Vickie, Sutton 2001-08-16, MRN [id number]  PCP:  Rory Percy, MD  Cardiologist:  Rozann Lesches, MD Electrophysiologist:  None   Chief Complaint  Patient presents with  . Cardiac follow-up    History of Present Illness: Vickie Sutton is a 19 y.o. female last seen in May by Mr. Leonides Sake NP.  She presents for a routine visit.  She continues to follow with OB, approximately [redacted] weeks pregnant and doing well per her discussion.  She has had genetic testing and gets a fetal ultrasound tomorrow on her birthday.  From a cardiac perspective she does report intermittent feelings of lightheadedness and palpitations, some of these have happened while seated.  Not a sudden onset of palpitations and she has not had any frank syncope.  We discussed adequate hydration.  She is not on beta-blockers at this point while pregnant.  Interval Neurology evaluation noted in August, diagnosed with chronic migraines.  Past Medical History:  Diagnosis Date  . ADD (attention deficit disorder)   . Asthma   . Polycystic ovarian syndrome     Past Surgical History:  Procedure Laterality Date  . ESOPHAGOGASTRODUODENOSCOPY    . ROUX-EN-Y GASTRIC BYPASS     August 2020 - [facility]  . TOOTH EXTRACTION N/A 12/12/2015   Procedure: EXTRACTION MOLARS - teeth one, sixteen, seventeen and thirty-two;  Surgeon: Diona Browner, DDS;  Location: [facility];  Service: Oral Surgery;  Laterality: N/A;    Current Outpatient Medications  Medication Sig Dispense Refill  . albuterol (PROAIR HFA) 108 (90 BASE) MCG/ACT inhaler Inhale 2 puffs into the lungs 4 (four) times daily as needed for wheezing or shortness of breath.     . Cyanocobalamin (VITAMIN B12 PO) Take 1 tablet by mouth daily.     Mariane Baumgarten Sodium (STOOL SOFTENER LAXATIVE PO) Take by mouth.    . Ferrous Sulfate (IRON PO) Take 1 tablet by mouth daily.    . folic acid (FOLVITE) 893 MCG tablet Take 400 mcg by mouth daily.     . Polyethylene Glycol 3350 (MIRALAX PO) Take by mouth.    . Prenatal Vit-Fe Fumarate-FA (PRENATAL MULTIVITAMIN) TABS tablet Take 1 tablet by mouth daily at 12 noon.     No current facility-administered medications for this visit.   Allergies:  Patient has no known allergies.   ROS:   No frank syncope.  Physical Exam: VS:  BP 100/64   Pulse 76   Ht 5\' 5"  (1.651 m)   Wt 169 lb (76.7 kg)   SpO2 98%   BMI 28.12 kg/m , BMI Body mass index is 28.12 kg/m.  Wt Readings from Last 3 Encounters:  08/11/20 169 lb (76.7 kg)  08/08/20 170 lb (77.1 kg)  07/25/20 167 lb (75.8 kg)    General: Patient appears comfortable at rest. HEENT: Conjunctiva and lids normal, wearing a mask. Lungs: Clear to auscultation, nonlabored breathing at rest. Cardiac: Regular rate and rhythm, no S3 or significant systolic murmur, no pericardial rub. Extremities: No pitting edema, distal pulses 2+.  ECG:  An ECG dated November 2020 was personally reviewed today and demonstrated:  Normal sinus rhythm with increased voltage, repolarization changes, normal QTc.  Recent Labwork:  July 2021: Hemoglobin 11.1, platelets 224, TSH 1.7, potassium 4.1, creatinine 0.58  Other Studies Reviewed Today:  Echocardiogram 02/09/2020: :1. Left ventricular ejection fraction, by estimation, is 55%. The left  ventricle has normal function. The left ventricle has no regional wall  motion abnormalities. Left ventricular diastolic parameters were normal.  2. Right ventricular systolic function is normal. The right ventricular  size is normal. There is normal pulmonary artery systolic pressure. The  estimated right ventricular systolic pressure is 55.7 mmHg.  3. The mitral valve is grossly normal. No evidence of mitral valve  regurgitation.  4. The aortic valve is tricuspid. Aortic valve regurgitation is not  visualized.  5. The inferior vena cava is normal in size with greater than 50%  respiratory variability, suggesting  right atrial pressure of 3 mmHg.   Assessment and Plan:  History of syncope and intermittent sense of palpitations.  Autonomic dysfunction and potential POTS has been suspected, no definite SVT by cardiac monitoring.  At this point we are observing, hydration recommended and also discussion of postural changes and exercise to ameliorate symptoms.  She had previously been on beta-blocker which we have stopped during her pregnancy.  No changes were made today.  Medication Adjustments/Labs and Tests Ordered: Current medicines are reviewed at length with the patient today.  Concerns regarding medicines are outlined above.   Tests Ordered: No orders of the defined types were placed in this encounter.   Medication Changes: No orders of the defined types were placed in this encounter.   Disposition:  Follow up 6 months.  Signed, Satira Sark, MD, [facility] 08/11/2020 2:08 PM    [place] at [facility] [address], [place], [place] [address] Phone: [phone]; Fax: [phone]

## 2020-08-11 ENCOUNTER — Ambulatory Visit (INDEPENDENT_AMBULATORY_CARE_PROVIDER_SITE_OTHER): Payer: 59 | Admitting: Cardiology

## 2020-08-11 ENCOUNTER — Other Ambulatory Visit: Payer: Self-pay

## 2020-08-11 ENCOUNTER — Encounter: Payer: Self-pay | Admitting: Cardiology

## 2020-08-11 VITALS — BP 100/64 | HR 76 | Ht 65.0 in | Wt 169.0 lb

## 2020-08-11 DIAGNOSIS — Z87898 Personal history of other specified conditions: Secondary | ICD-10-CM | POA: Diagnosis not present

## 2020-08-11 NOTE — Patient Instructions (Addendum)
Medication Instructions:  Your physician recommends that you continue on your current medications as directed. Please refer to the Current Medication list given to you today.  *If you need a refill on your cardiac medications before your next appointment, please call your pharmacy*   Lab Work: None today If you have labs (blood work) drawn today and your tests are completely normal, you will receive your results only by:  [place] (if you have MyChart) OR  A paper copy in the mail If you have any lab test that is abnormal or we need to change your treatment, we will call you to review the results.   Testing/Procedures: None today   Follow-Up: At [facility], you and your health needs are our priority.  As part of our continuing mission to provide you with exceptional heart care, we have created designated Provider Care Teams.  These Care Teams include your primary Cardiologist (physician) and Advanced Practice Providers (APPs -  Physician Assistants and Nurse Practitioners) who all work together to provide you with the care you need, when you need it.  We recommend signing up for the patient portal called "MyChart".  Sign up information is provided on this After Visit Summary.  MyChart is used to connect with patients for Virtual Visits (Telemedicine).  Patients are able to view lab/test results, encounter notes, upcoming appointments, etc.  Non-urgent messages can be sent to your provider as well.   To learn more about what you can do with MyChart, go to [email or web address].    Your next appointment:   6 month(s)  The format for your next appointment:   In Person  Provider:   Rozann Lesches, MD   Other Instructions None    Congrats on the baby !!!  Thank you for choosing [facility] !

## 2020-08-12 ENCOUNTER — Other Ambulatory Visit: Payer: 59

## 2020-08-12 ENCOUNTER — Ambulatory Visit (INDEPENDENT_AMBULATORY_CARE_PROVIDER_SITE_OTHER): Payer: 59

## 2020-08-12 DIAGNOSIS — Z3A13 13 weeks gestation of pregnancy: Secondary | ICD-10-CM

## 2020-08-12 DIAGNOSIS — Z3682 Encounter for antenatal screening for nuchal translucency: Secondary | ICD-10-CM | POA: Diagnosis not present

## 2020-08-12 DIAGNOSIS — O99891 Other specified diseases and conditions complicating pregnancy: Secondary | ICD-10-CM

## 2020-08-12 DIAGNOSIS — Z3401 Encounter for supervision of normal first pregnancy, first trimester: Secondary | ICD-10-CM

## 2020-08-12 DIAGNOSIS — E282 Polycystic ovarian syndrome: Secondary | ICD-10-CM | POA: Diagnosis not present

## 2020-08-12 DIAGNOSIS — O99841 Bariatric surgery status complicating pregnancy, first trimester: Secondary | ICD-10-CM

## 2020-08-12 NOTE — Progress Notes (Signed)
[place] 13+3 wks,measurements c/w dates,anterior placenta,FHR 152 BPM,NB present,NT 1.4 mm,enlarged ovaries,hx of PCOS,simple right adnexal cyst adjacent to right ovary  21 x 11.5 x 10.9 cm (seen on outside ultrasound),unable to get color flow,no pelvic pain

## 2020-08-13 ENCOUNTER — Encounter: Payer: Self-pay | Admitting: Obstetrics and Gynecology

## 2020-08-13 DIAGNOSIS — N83201 Unspecified ovarian cyst, right side: Secondary | ICD-10-CM | POA: Insufficient documentation

## 2020-08-15 LAB — INTEGRATED 1
Crown Rump Length: 73.9 mm
Gest. Age on Collection Date: 13.3 weeks
Maternal Age at EDD: 21.5 yr
Nuchal Translucency (NT): 1.4 mm
Number of Fetuses: 1
PAPP-A Value: 667.1 ng/mL
Weight: 169 [lb_av]

## 2020-08-15 LAB — HEPATITIS C ANTIBODY: Hep C Virus Ab: <0.1 s/co ratio (ref 0.0–0.9)

## 2020-08-26 ENCOUNTER — Telehealth: Payer: Self-pay | Admitting: Obstetrics and Gynecology

## 2020-08-26 NOTE — Telephone Encounter (Signed)
Pt called to ask her to call me or be seen quickly to discuss the large ovarian cyst seen on recent u/s. To discuss excision not that she is past 14 weeks.

## 2020-09-02 ENCOUNTER — Encounter: Payer: Self-pay | Admitting: Obstetrics and Gynecology

## 2020-09-02 ENCOUNTER — Ambulatory Visit (INDEPENDENT_AMBULATORY_CARE_PROVIDER_SITE_OTHER): Payer: 59 | Admitting: Obstetrics and Gynecology

## 2020-09-02 VITALS — BP 99/57 | HR 67 | Wt 166.4 lb

## 2020-09-02 DIAGNOSIS — Z331 Pregnant state, incidental: Secondary | ICD-10-CM

## 2020-09-02 DIAGNOSIS — Z1389 Encounter for screening for other disorder: Secondary | ICD-10-CM

## 2020-09-02 DIAGNOSIS — Z3402 Encounter for supervision of normal first pregnancy, second trimester: Secondary | ICD-10-CM

## 2020-09-02 DIAGNOSIS — Z3A16 16 weeks gestation of pregnancy: Secondary | ICD-10-CM

## 2020-09-02 LAB — POCT URINALYSIS DIPSTICK OB
Blood, UA: NEGATIVE
Glucose, UA: NEGATIVE
Ketones, UA: NEGATIVE
Leukocytes, UA: NEGATIVE
Nitrite, UA: NEGATIVE
POC,PROTEIN,UA: NEGATIVE

## 2020-09-02 NOTE — Progress Notes (Addendum)
LOW-RISK PREGNANCY VISIT Patient name: Vickie Sutton MRN [id number]  Date of birth: 2001/06/16 Chief Complaint:   Routine Prenatal Visit  History of Present Illness:   TALENE GLASTETTER is a 19 y.o. G1P0 female at [redacted]w[redacted]d with an Estimated Date of Delivery: 02/14/21 being seen today for ongoing management of a low-risk pregnancy. Ultrasound on 08/12/2020 showed a large simple cyst at the right adnexa that was adjacent to the visualized right ovary.  She has a history of gastric bypass and lost approximately 100 pounds in one year. Depression screen [facility] 2/9 08/08/2020  Decreased Interest 0  Down, Depressed, Hopeless 0  PHQ - 2 Score 0  Altered sleeping 0  Tired, decreased energy 2  Change in appetite 0  Feeling bad or failure about yourself  0  Trouble concentrating 0  Moving slowly or fidgety/restless 0  Suicidal thoughts 0  PHQ-9 Score 2    Today she reports no complaints. Contractions: Not present. Vag. Bleeding: None.    denies leaking of fluid. Review of Systems:   Pertinent items are noted in HPI Denies abnormal vaginal discharge w/ itching/odor/irritation, headaches, visual changes, shortness of breath, chest pain, abdominal pain, severe nausea/vomiting, or problems with urination or bowel movements unless otherwise stated above. Pertinent History Reviewed:  Reviewed past medical,surgical, social, obstetrical and family history.  Reviewed problem list, medications and allergies. Physical Assessment:   Vitals:   09/02/20 1219  BP: (!) 99/57  Pulse: 67  Weight: 166 lb 6.4 oz (75.5 kg)  Body mass index is 27.69 kg/m.        Physical Examination:   General appearance: Well appearing, and in no distress  Mental status: Alert, oriented to person, place, and time  Skin: Warm & dry  Cardiovascular: Normal heart rate noted  Respiratory: Normal respiratory effort, no distress  Abdomen: Soft, gravid, nontender  Pelvic: Cervical exam deferred         Extremities: Edema:  None  Fetal Status:       Presentation: Transverse  TECHNICIAN COMMENTS:  [place] 13+3 wks,measurements c/w dates,anterior placenta,FHR 152 BPM,NB present,NT 1.4 mm,enlarged ovaries,hx of PCOS,simple right adnexal cyst adjacent to right ovary  21 x 11.5 x 10.9 cm (seen on outside ultrasound),unable to get color flow,no pelvic pain   The patient will have the first blood draw of her integrated screening today and the second draw in approximately 4 weeks.  Amber Heide Guile 08/12/2020 1:19 PM  Chaperone: Clerance Lav    Results for orders placed or performed in visit on 09/02/20 (from the past 24 hour(s))  POC Urinalysis Dipstick OB   Collection Time: 09/02/20 12:18 PM  Result Value Ref Range   Color, UA     Clarity, UA     Glucose, UA Negative Negative   Bilirubin, UA     Ketones, UA neg    Spec Grav, UA     Blood, UA neg    pH, UA     POC,PROTEIN,UA Negative Negative, Trace, Small (1+), Moderate (2+), Large (3+), 4+   Urobilinogen, UA     Nitrite, UA neg    Leukocytes, UA Negative Negative   Appearance     Odor      Assessment & Plan:  1) Low-risk pregnancy G1P0 at [redacted]w[redacted]d with an Estimated Date of Delivery: 02/14/21   2) Right ovarian cyst. The patient was seen and evaluted at bedside by Dr. Elonda Husky. He will speak with the patient next week to schedule surgery.   Meds: No orders  of the defined types were placed in this encounter.   Labs/procedures today: None  Plan: Continue routine obstetrical care.  Next visit: In person in two weeks postop check with Dr Elonda Husky.  Reviewed: Preterm labor symptoms and general obstetric precautions including but not limited to vaginal bleeding, contractions, leaking of fluid and fetal movement were reviewed in detail with the patient.  All questions were answered. Has home bp cuff. Check bp weekly, let us know if >140/90.   Follow-up: No follow-ups on file.  Orders Placed This Encounter  Procedures  . POC Urinalysis Dipstick OB   By signing  my name below, I, Clerance Lav, attest that this documentation has been prepared under the direction and in the presence of Jonnie Kind, MD. Electronically Signed: [place]. 09/02/20. 12:27 PM.  I personally performed the services described in this documentation, which was SCRIBED in my presence. The recorded information has been reviewed and considered accurate. It has been edited as necessary during review. Jonnie Kind, MD

## 2020-09-05 ENCOUNTER — Telehealth: Payer: Self-pay

## 2020-09-05 ENCOUNTER — Other Ambulatory Visit: Payer: Self-pay | Admitting: Obstetrics & Gynecology

## 2020-09-05 NOTE — Telephone Encounter (Signed)
New message    The patient is calling checking on the status of upcoming procedure.    The patient voiced Dr. Elonda Husky is supposed to be calling her.

## 2020-09-05 NOTE — Telephone Encounter (Signed)
Please call pt with her details for pre op etc  I guess Vickie Sutton has not called yet

## 2020-09-06 ENCOUNTER — Other Ambulatory Visit (HOSPITAL_COMMUNITY)
Admission: RE | Admit: 2020-09-06 | Discharge: 2020-09-06 | Disposition: A | Discharge status: Home / Self Care | Payer: 59 | Source: Ambulatory Visit | Attending: Obstetrics & Gynecology | Admitting: Obstetrics & Gynecology

## 2020-09-06 ENCOUNTER — Encounter (HOSPITAL_COMMUNITY): Payer: Self-pay | Admitting: Obstetrics & Gynecology

## 2020-09-06 ENCOUNTER — Other Ambulatory Visit: Payer: Self-pay

## 2020-09-06 DIAGNOSIS — Z833 Family history of diabetes mellitus: Secondary | ICD-10-CM | POA: Diagnosis not present

## 2020-09-06 DIAGNOSIS — O3482 Maternal care for other abnormalities of pelvic organs, second trimester: Secondary | ICD-10-CM | POA: Diagnosis not present

## 2020-09-06 DIAGNOSIS — E282 Polycystic ovarian syndrome: Secondary | ICD-10-CM | POA: Diagnosis not present

## 2020-09-06 DIAGNOSIS — Z9884 Bariatric surgery status: Secondary | ICD-10-CM | POA: Diagnosis not present

## 2020-09-06 DIAGNOSIS — Z8249 Family history of ischemic heart disease and other diseases of the circulatory system: Secondary | ICD-10-CM | POA: Diagnosis not present

## 2020-09-06 DIAGNOSIS — F988 Other specified behavioral and emotional disorders with onset usually occurring in childhood and adolescence: Secondary | ICD-10-CM | POA: Diagnosis not present

## 2020-09-06 DIAGNOSIS — J45909 Unspecified asthma, uncomplicated: Secondary | ICD-10-CM | POA: Diagnosis not present

## 2020-09-06 DIAGNOSIS — Z01812 Encounter for preprocedural laboratory examination: Secondary | ICD-10-CM | POA: Insufficient documentation

## 2020-09-06 DIAGNOSIS — Z20822 Contact with and (suspected) exposure to covid-19: Secondary | ICD-10-CM | POA: Insufficient documentation

## 2020-09-06 LAB — SARS CORONAVIRUS 2 (TAT 6-24 HRS): SARS Coronavirus 2: NEGATIVE

## 2020-09-06 MED ORDER — BUPIVACAINE LIPOSOME 1.3 % IJ SUSP
20.0000 mL | Freq: Once | INTRAMUSCULAR | Status: DC
  Filled 2020-09-06: qty 20

## 2020-09-06 NOTE — Progress Notes (Signed)
Anesthesia Chart Review:  Pt is a same day work up   Case: [id number] Date/Time: 09/07/20 1058   Procedure: EXPLORATORY LAPAROTOMY WITH EXTRACTION  OVARIAN CYST (N/A )   Anesthesia type: General   Pre-op diagnosis: 20cm MASS   Location: [facility] OR ROOM 08 / [facility]   Surgeons: Florian Buff, MD      DISCUSSION: Pt is 19 years old with hx palpitations, asthma, PCOS, gastric bypass surgery.   Pt is [redacted] weeks pregnant (EDD 02/14/21). I spoke with Tish in Dr. Brynda Greathouse office- he will check fetal heart tones himself perioperatively.   - Pt told pre-admission testing RN on phone call that  "I think I swallowed wrong, and it took my breath away,then I began to hyperventilate, heart rate raced and I had chest pressure- it felt like a hippopotamus was seating on my chest." My husband hekped me to calm down, it lasted approximately 10 minutes."  Reportedly this is similar to prior anxiety episodes.   - Cardiologist is Rozann Lesches, MD. Last office visit 08/11/20. This note documents "History of syncope and intermittent sense of palpitations.  Autonomic dysfunction and potential POTS has been suspected, no definite SVT by cardiac monitoring.  At this point we are observing, hydration recommended and also discussion of postural changes and exercise to ameliorate symptoms.  She had previously been on beta-blocker which we have stopped during her pregnancy.  No changes were made today"   PROVIDERS: - PCP is Rory Percy, MD   LABS: Will be obtained day of surgery    EKG 10/27/19: Normal sinus rhythm with increased voltage, repolarization changes, normal QTc.   CV: Echo 02/09/20:  1. Left ventricular ejection fraction, by estimation, is 55%. The left ventricle has normal function. The left ventricle has no regional wall motion abnormalities. Left ventricular diastolic parameters were normal.  2. Right ventricular systolic function is normal. The right ventricular  size is normal. There is normal pulmonary  artery systolic pressure. The  estimated right ventricular systolic pressure is 38.7 mmHg.  3. The mitral valve is grossly normal. No evidence of mitral valve regurgitation.  4. The aortic valve is tricuspid. Aortic valve regurgitation is not visualized.  5. The inferior vena cava is normal in size with greater than 50% respiratory variability, suggesting right atrial pressure of 3 mmHg.    Long-term cardiac monitor 12/28/19: - Zio patch reviewed.  11 days 9 hours analyzed.   - Predominant rhythm is sinus with heart rate ranging from 44 bpm up to 199 bpm and average heart rate 74 bpm.   - Rare PACs and PVCs were noted representing less than 1% of total beats. - Fastest episodes of tachycardia are potentially sinus, although cannot completely exclude SVT.   - There does not look to be an abrupt transition from normal to very rapid heart rate however based on available information.   Past Medical History:  Diagnosis Date  . ADD (attention deficit disorder)   . Anxiety   . Asthma   . Complication of anesthesia   . Dysrhythmia    rapid heart rate  . Headache    with rapid heart episodes  . Panic attacks   . Polycystic ovarian syndrome   . PONV (postoperative nausea and vomiting)   . POTS (postural orthostatic tachycardia syndrome)     Past Surgical History:  Procedure Laterality Date  . ESOPHAGOGASTRODUODENOSCOPY    . ROUX-EN-Y GASTRIC BYPASS     August 2020 - [facility]  . TOOTH EXTRACTION  N/A 12/12/2015   Procedure: EXTRACTION MOLARS - teeth one, sixteen, seventeen and thirty-two;  Surgeon: Diona Browner, DDS;  Location: [facility];  Service: Oral Surgery;  Laterality: N/A;    MEDICATIONS: . [START ON 09/07/2020] bupivacaine liposome (EXPAREL) 1.3 % injection 266 mg   . albuterol (PROAIR HFA) 108 (90 BASE) MCG/ACT inhaler  . Cyanocobalamin (VITAMIN B12 PO)  . Docusate Sodium (STOOL SOFTENER LAXATIVE PO)  . Ferrous Sulfate (IRON PO)  . FOLIC ACID PO  . Polyethylene Glycol 3350 (MIRALAX  PO)  . Prenatal Vit-Fe Fumarate-FA (PRENATAL MULTIVITAMIN) TABS tablet    If labs acceptable day of surgery, I anticipate pt can proceed with surgery as scheduled.  Willeen Cass, PhD, FNP-BC [facility]Anesthesiology Phone: [phone] 09/06/2020 2:37 PM

## 2020-09-06 NOTE — Progress Notes (Addendum)
[name] denies s/s of Covid, she was tested this am and is in quarantine with family.  [name] has a history of  palpations, lightheadedness, chest pain and "blacking out." Ms Vickie Sutton is followed by Dr Domenic Polite. Patient was taking Metoprolol and medication for anxiety, until pregnancy. Ms Vickie Sutton states that she still experiences the symptoms, but does not pass out. Patient reports that 'Dr. Domenic Polite is aware, and instructed patient to sit down and breath slowly when this happens.' Ms Vickie Sutton reports that she had an episode last night 09/05/20; "I think I swallowed wrong, and it took my breath away,then I began to hyperventilate, heart rate raced and I had chest pressure- it felt like a hippopotamus was seating on my chest." My husband hekped me to calm down, it lasted approximately 10 minutes."  [name] said that she has had this type of chest pain in the past and has informed Dr. Domenic Polite.

## 2020-09-06 NOTE — Anesthesia Preprocedure Evaluation (Addendum)
Anesthesia Evaluation   Patient awake    Reviewed: Allergy & Precautions, NPO status , Patient's Chart, lab work & pertinent test results  History of Anesthesia Complications (+) PONV and history of anesthetic complications  Airway Mallampati: II  TM Distance: >3 FB Neck ROM: Full    Dental  (+) Dental Advisory Given, Teeth Intact   Pulmonary asthma ,    breath sounds clear to auscultation       Cardiovascular negative cardio ROS   Rhythm:Regular     Neuro/Psych  Headaches, PSYCHIATRIC DISORDERS Anxiety    GI/Hepatic negative GI ROS, Neg liver ROS,   Endo/Other  negative endocrine ROS  Renal/GU negative Renal ROS     Musculoskeletal negative musculoskeletal ROS (+)   Abdominal   Peds  Hematology negative hematology ROS (+)   Anesthesia Other Findings   Reproductive/Obstetrics (+) Pregnancy [redacted] weeks pregnant, FH dopplers preop                             Anesthesia Physical Anesthesia Plan  ASA: II  Anesthesia Plan: General   Post-op Pain Management:    Induction: Intravenous, Rapid sequence and Cricoid pressure planned  PONV Risk Score and Plan: 4 or greater and Ondansetron and Dexamethasone  Airway Management Planned: Oral ETT  Additional Equipment: None  Intra-op Plan:   Post-operative Plan: Extubation in OR  Informed Consent: I have reviewed the patients History and Physical, chart, labs and discussed the procedure including the risks, benefits and alternatives for the proposed anesthesia with the patient or authorized representative who has indicated his/her understanding and acceptance.     Dental advisory given  Plan Discussed with: CRNA and Surgeon  Anesthesia Plan Comments: (See APP note by Durel Salts, FNP )       Anesthesia Quick Evaluation

## 2020-09-07 ENCOUNTER — Inpatient Hospital Stay (HOSPITAL_COMMUNITY): Payer: 59 | Admitting: Emergency Medicine

## 2020-09-07 ENCOUNTER — Inpatient Hospital Stay (HOSPITAL_COMMUNITY)
Admission: RE | Admit: 2020-09-07 | Discharge: 2020-09-07 | DRG: 819 | Disposition: A | Discharge status: Home / Self Care | Payer: 59 | Source: Ambulatory Visit | Attending: Obstetrics & Gynecology | Admitting: Obstetrics & Gynecology

## 2020-09-07 ENCOUNTER — Encounter (HOSPITAL_COMMUNITY)
Admission: RE | Disposition: A | Discharge status: Home / Self Care | Payer: Self-pay | Source: Ambulatory Visit | Attending: Obstetrics & Gynecology

## 2020-09-07 ENCOUNTER — Other Ambulatory Visit: Payer: Self-pay | Admitting: Obstetrics & Gynecology

## 2020-09-07 DIAGNOSIS — Z9884 Bariatric surgery status: Secondary | ICD-10-CM | POA: Diagnosis not present

## 2020-09-07 DIAGNOSIS — J45909 Unspecified asthma, uncomplicated: Secondary | ICD-10-CM | POA: Diagnosis not present

## 2020-09-07 DIAGNOSIS — F988 Other specified behavioral and emotional disorders with onset usually occurring in childhood and adolescence: Secondary | ICD-10-CM | POA: Diagnosis present

## 2020-09-07 DIAGNOSIS — N83209 Unspecified ovarian cyst, unspecified side: Secondary | ICD-10-CM | POA: Diagnosis present

## 2020-09-07 DIAGNOSIS — O99512 Diseases of the respiratory system complicating pregnancy, second trimester: Secondary | ICD-10-CM | POA: Diagnosis not present

## 2020-09-07 DIAGNOSIS — D270 Benign neoplasm of right ovary: Secondary | ICD-10-CM

## 2020-09-07 DIAGNOSIS — E282 Polycystic ovarian syndrome: Secondary | ICD-10-CM | POA: Diagnosis present

## 2020-09-07 DIAGNOSIS — Z8249 Family history of ischemic heart disease and other diseases of the circulatory system: Secondary | ICD-10-CM

## 2020-09-07 DIAGNOSIS — Z20822 Contact with and (suspected) exposure to covid-19: Secondary | ICD-10-CM | POA: Diagnosis present

## 2020-09-07 DIAGNOSIS — O3482 Maternal care for other abnormalities of pelvic organs, second trimester: Principal | ICD-10-CM | POA: Diagnosis present

## 2020-09-07 DIAGNOSIS — Z833 Family history of diabetes mellitus: Secondary | ICD-10-CM

## 2020-09-07 DIAGNOSIS — N83201 Unspecified ovarian cyst, right side: Secondary | ICD-10-CM | POA: Diagnosis not present

## 2020-09-07 HISTORY — DX: Panic disorder (episodic paroxysmal anxiety): F41.0

## 2020-09-07 HISTORY — PX: LAPAROTOMY: SHX154

## 2020-09-07 HISTORY — DX: Other specified cardiac arrhythmias: I49.8

## 2020-09-07 HISTORY — DX: Anxiety disorder, unspecified: F41.9

## 2020-09-07 HISTORY — DX: Postural orthostatic tachycardia syndrome (POTS): G90.A

## 2020-09-07 LAB — CBC
HCT: 41.7 % (ref 36.0–46.0)
Hemoglobin: 13.3 g/dL (ref 12.0–15.0)
MCH: 29.6 pg (ref 26.0–34.0)
MCHC: 31.9 g/dL (ref 30.0–36.0)
MCV: 92.7 fL (ref 80.0–100.0)
Platelets: 217 10*3/uL (ref 150–400)
RBC: 4.5 MIL/uL (ref 3.87–5.11)
RDW: 13.1 % (ref 11.5–15.5)
WBC: 9.3 10*3/uL (ref 4.0–10.5)
nRBC: 0 % (ref 0.0–0.2)

## 2020-09-07 LAB — COMPREHENSIVE METABOLIC PANEL
ALT: 23 U/L (ref 0–44)
AST: 29 U/L (ref 15–41)
Albumin: 3.4 g/dL — ABNORMAL LOW (ref 3.5–5.0)
Alkaline Phosphatase: 83 U/L (ref 38–126)
Anion gap: 12 (ref 5–15)
BUN: 6 mg/dL (ref 6–20)
CO2: 20 mmol/L — ABNORMAL LOW (ref 22–32)
Calcium: 9.2 mg/dL (ref 8.9–10.3)
Chloride: 102 mmol/L (ref 98–111)
Creatinine, Ser: 0.45 mg/dL (ref 0.44–1.00)
GFR calc Af Amer: >60 mL/min (ref >60)
GFR calc non Af Amer: >60 mL/min (ref >60)
Glucose, Bld: 72 mg/dL (ref 70–99)
Potassium: 3.4 mmol/L — ABNORMAL LOW (ref 3.5–5.1)
Sodium: 134 mmol/L — ABNORMAL LOW (ref 135–145)
Total Bilirubin: 0.7 mg/dL (ref 0.3–1.2)
Total Protein: 7.3 g/dL (ref 6.5–8.1)

## 2020-09-07 LAB — ABO/RH: ABO/RH(D): B POS

## 2020-09-07 SURGERY — LAPAROTOMY, EXPLORATORY
Anesthesia: General | Site: Abdomen

## 2020-09-07 MED ORDER — POVIDONE-IODINE 10 % EX SWAB: 2.0000 "application " | Freq: Once | CUTANEOUS | Status: DC

## 2020-09-07 MED ORDER — CHLORHEXIDINE GLUCONATE 0.12 % MT SOLN
15.0000 mL | Freq: Once | OROMUCOSAL | Status: AC
  Administered 2020-09-07: 15 mL via OROMUCOSAL
  Filled 2020-09-07: qty 15

## 2020-09-07 MED ORDER — GLYCOPYRROLATE 0.2 MG/ML IJ SOLN
INTRAMUSCULAR | Status: DC | PRN
  Administered 2020-09-07: .6 mg via INTRAVENOUS

## 2020-09-07 MED ORDER — ACETAMINOPHEN 10 MG/ML IV SOLN
INTRAVENOUS | Status: DC | PRN
  Administered 2020-09-07: 1000 mg via INTRAVENOUS

## 2020-09-07 MED ORDER — PHENYLEPHRINE HCL-NACL 10-0.9 MG/250ML-% IV SOLN
INTRAVENOUS | Status: DC | PRN
  Administered 2020-09-07: 30 ug/min via INTRAVENOUS

## 2020-09-07 MED ORDER — PROPOFOL 10 MG/ML IV BOLUS
INTRAVENOUS | Status: DC | PRN
  Administered 2020-09-07: 160 mg via INTRAVENOUS

## 2020-09-07 MED ORDER — ONDANSETRON 8 MG PO TBDP: 8.0000 mg | ORAL_TABLET | Freq: Three times a day (TID) | ORAL | 0 refills | Status: DC | PRN

## 2020-09-07 MED ORDER — HYDROCODONE-ACETAMINOPHEN 5-325 MG PO TABS
1.0000 | ORAL_TABLET | Freq: Four times a day (QID) | ORAL | 0 refills | Status: DC | PRN
Start: 2020-09-07 — End: 2020-10-11

## 2020-09-07 MED ORDER — NEOSTIGMINE METHYLSULFATE 10 MG/10ML IV SOLN
INTRAVENOUS | Status: DC | PRN
  Administered 2020-09-07: 3 mg via INTRAVENOUS

## 2020-09-07 MED ORDER — PROPOFOL 10 MG/ML IV BOLUS
INTRAVENOUS | Status: AC
  Filled 2020-09-07: qty 20

## 2020-09-07 MED ORDER — OXYCODONE HCL 5 MG PO TABS: 5.0000 mg | ORAL_TABLET | Freq: Once | ORAL | Status: AC

## 2020-09-07 MED ORDER — DEXAMETHASONE SODIUM PHOSPHATE 10 MG/ML IJ SOLN
INTRAMUSCULAR | Status: DC | PRN
  Administered 2020-09-07: 10 mg via INTRAVENOUS

## 2020-09-07 MED ORDER — FENTANYL CITRATE (PF) 250 MCG/5ML IJ SOLN
INTRAMUSCULAR | Status: DC | PRN
Start: 2020-09-07 — End: 2020-09-07
  Administered 2020-09-07: 100 ug via INTRAVENOUS
  Administered 2020-09-07 (×3): 50 ug via INTRAVENOUS

## 2020-09-07 MED ORDER — SODIUM CHLORIDE 0.9 % IV SOLN
INTRAVENOUS | Status: DC | PRN
  Administered 2020-09-07: 40 mL

## 2020-09-07 MED ORDER — LACTATED RINGERS IV SOLN: INTRAVENOUS | Status: DC

## 2020-09-07 MED ORDER — ACETAMINOPHEN 10 MG/ML IV SOLN
INTRAVENOUS | Status: AC
  Filled 2020-09-07: qty 100

## 2020-09-07 MED ORDER — OXYCODONE HCL 5 MG PO TABS
ORAL_TABLET | ORAL | Status: AC
Start: 2020-09-07 — End: 2020-09-07
  Administered 2020-09-07: 5 mg via ORAL
  Filled 2020-09-07: qty 1

## 2020-09-07 MED ORDER — FENTANYL CITRATE (PF) 100 MCG/2ML IJ SOLN
25.0000 ug | INTRAMUSCULAR | Status: DC | PRN
  Administered 2020-09-07 (×2): 25 ug via INTRAVENOUS

## 2020-09-07 MED ORDER — BUPIVACAINE-EPINEPHRINE 0.5% -1:200000 IJ SOLN
INTRAMUSCULAR | Status: AC
  Filled 2020-09-07: qty 1

## 2020-09-07 MED ORDER — FENTANYL CITRATE (PF) 100 MCG/2ML IJ SOLN
INTRAMUSCULAR | Status: AC
Start: 2020-09-07 — End: 2020-09-07
  Administered 2020-09-07: 25 ug via INTRAVENOUS
  Filled 2020-09-07: qty 2

## 2020-09-07 MED ORDER — HYDROCODONE-ACETAMINOPHEN 5-325 MG PO TABS: 1.0000 | ORAL_TABLET | Freq: Four times a day (QID) | ORAL | 0 refills | Status: DC | PRN

## 2020-09-07 MED ORDER — ORAL CARE MOUTH RINSE: 15.0000 mL | Freq: Once | OROMUCOSAL | Status: AC

## 2020-09-07 MED ORDER — FENTANYL CITRATE (PF) 250 MCG/5ML IJ SOLN
INTRAMUSCULAR | Status: AC
  Filled 2020-09-07: qty 5

## 2020-09-07 MED ORDER — ONDANSETRON HCL 4 MG/2ML IJ SOLN
INTRAMUSCULAR | Status: DC | PRN
  Administered 2020-09-07: 4 mg via INTRAVENOUS

## 2020-09-07 MED ORDER — SUCCINYLCHOLINE CHLORIDE 20 MG/ML IJ SOLN
INTRAMUSCULAR | Status: DC | PRN
  Administered 2020-09-07: 100 mg via INTRAVENOUS

## 2020-09-07 MED ORDER — LIDOCAINE 2% (20 MG/ML) 5 ML SYRINGE
INTRAMUSCULAR | Status: DC | PRN
  Administered 2020-09-07: 60 mg via INTRAVENOUS

## 2020-09-07 MED ORDER — CEFAZOLIN SODIUM-DEXTROSE 2-4 GM/100ML-% IV SOLN
2.0000 g | INTRAVENOUS | Status: AC
  Administered 2020-09-07: 2 g via INTRAVENOUS
  Filled 2020-09-07: qty 100

## 2020-09-07 MED ORDER — ROCURONIUM BROMIDE 10 MG/ML (PF) SYRINGE
PREFILLED_SYRINGE | INTRAVENOUS | Status: DC | PRN
  Administered 2020-09-07: 40 mg via INTRAVENOUS

## 2020-09-07 SURGICAL SUPPLY — 39 items
BLADE SURG 10 STRL SS (BLADE) ×4 IMPLANT
CANISTER SUCT 3000ML PPV (MISCELLANEOUS) ×2 IMPLANT
COVER WAND RF STERILE (DRAPES) ×2 IMPLANT
DECANTER SPIKE VIAL GLASS SM (MISCELLANEOUS) IMPLANT
DERMABOND ADVANCED (GAUZE/BANDAGES/DRESSINGS) ×1
DERMABOND ADVANCED .7 DNX12 (GAUZE/BANDAGES/DRESSINGS) ×1 IMPLANT
DRAPE WARM FLUID 44X44 (DRAPES) IMPLANT
DRSG OPSITE POSTOP 4X10 (GAUZE/BANDAGES/DRESSINGS) ×2 IMPLANT
DRSG OPSITE POSTOP 4X8 (GAUZE/BANDAGES/DRESSINGS) ×2 IMPLANT
DURAPREP 26ML APPLICATOR (WOUND CARE) ×2 IMPLANT
GAUZE 4X4 16PLY RFD (DISPOSABLE) IMPLANT
GAUZE SPONGE 4X4 12PLY STRL (GAUZE/BANDAGES/DRESSINGS) ×2 IMPLANT
GLOVE BIOGEL PI IND STRL 7.0 (GLOVE) ×1 IMPLANT
GLOVE BIOGEL PI IND STRL 8 (GLOVE) ×1 IMPLANT
GLOVE BIOGEL PI INDICATOR 7.0 (GLOVE) ×1
GLOVE BIOGEL PI INDICATOR 8 (GLOVE) ×1
GLOVE ECLIPSE 8.0 STRL XLNG CF (GLOVE) ×2 IMPLANT
GOWN STRL REUS W/ TWL LRG LVL3 (GOWN DISPOSABLE) ×2 IMPLANT
GOWN STRL REUS W/TWL LRG LVL3 (GOWN DISPOSABLE) ×2
KIT TURNOVER KIT B (KITS) ×2 IMPLANT
NS IRRIG 1000ML POUR BTL (IV SOLUTION) ×2 IMPLANT
PACK ABDOMINAL GYN (CUSTOM PROCEDURE TRAY) ×2 IMPLANT
PAD OB MATERNITY 4.3X12.25 (PERSONAL CARE ITEMS) ×2 IMPLANT
RETRACTOR WND ALEXIS 25 LRG (MISCELLANEOUS) IMPLANT
RTRCTR WOUND ALEXIS 25CM LRG (MISCELLANEOUS)
SHEARS HARMONIC ACE PLUS 36CM (ENDOMECHANICALS) IMPLANT
SPONGE INTESTINAL PEANUT (DISPOSABLE) IMPLANT
SPONGE LAP 18X18 RF (DISPOSABLE) ×2 IMPLANT
SUT CHROMIC 0 CT 1 (SUTURE) IMPLANT
SUT VIC AB 0 CT1 18XCR BRD8 (SUTURE) ×1 IMPLANT
SUT VIC AB 0 CT1 27 (SUTURE) ×1
SUT VIC AB 0 CT1 27XBRD ANBCTR (SUTURE) ×1 IMPLANT
SUT VIC AB 0 CT1 8-18 (SUTURE) ×1
SUT VIC AB 0 CTX 36 (SUTURE)
SUT VIC AB 0 CTX36XBRD ANBCTRL (SUTURE) IMPLANT
SUT VIC AB 4-0 KS 27 (SUTURE) ×2 IMPLANT
SYR BULB EAR ULCER 3OZ GRN STR (SYRINGE) ×2 IMPLANT
TOWEL GREEN STERILE FF (TOWEL DISPOSABLE) ×4 IMPLANT
TRAY FOLEY W/BAG SLVR 14FR (SET/KITS/TRAYS/PACK) ×2 IMPLANT

## 2020-09-07 NOTE — Op Note (Signed)
Preoperative diagnosis: 21 cm right adnexal cystic mass                                         [redacted] weeks gestation  Postoperative diagnosis: Same as above  Procedure: Exploratory laparotomy with right ovarian cystectomy  Surgeon:Boleslaus Holloway Chriss Driver, MD  Anesthesia: General endotracheal  Findings: Patient was known to have a 21 cm right adnexal mass.  There was some debate on scans of whether it was of ovarian or paraovarian origin.  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  There were no excrescences or other peritoneal abnormalities.  The uterus was appropriately enlarged for 17-week pregnancy.  The right ovary was otherwise normal.  There were no masses identified in the left ovary.  Description of operation: Patient was taken to the operating room where she was placed in the supine position She underwent general endotracheal anesthesia She was prepped and draped for a laparotomy procedure A Foley catheter was placed A right sided low transverse incision was made 8 cm in length The incision was taken down sharply to the rectus fascia The peritoneal cavity was entered bluntly The rectus muscles were divided manually A medium Richardson retractor was placed to retract the upper abdominal wall The cystic mass was seen Electrocautery unit was used and a very small pinhole incision was made with suction in the neighborhood the suction fluid as it escaped I was able to retrieve all the fluid with little to no spillage Once I got about 200 cc of fluid out I was able to grab the edge of the cyst with a Babcock clamp and then made a larger incision in the mass without any further spillage and put the suction inside the mass and deflated it completely Total volume of fluid was about 1100 cc total in the mass and all clear-colored fluid appeared to be serous I then delivered the mass through the abdominal incision It  involved portion of the ovary but minimal and did not involve the fallopian tube I was able to clamp across the base of the cystic mass and take off probably about 10% of the ovary avoiding ligating the ovarian vasculature Of note there was no torsion of the infundibulopelvic ligament, ovary or cystic mass The pedicle was taken in 3 separate clamps Each pedicle was clamped cut for and aft suture ligated with good hemostasis Each pedicle was overlapping with the previous pedicle A 4 and aft suture was then placed around the entire pedicle site thus being double suture ligated There was good hemostasis The ovary and tube were placed back in the right adnexa and I made sure that there was no torsion of either pedicle The muscles and peritoneum were reapproximated loosely The fascia was closed using 0 Vicryl running The subcutaneous tissue was made hemostatic and irrigated 266 mg of Exparel was injected into the subcutaneous tissue for postoperative pain management The skin was closed using 4-0 Vicryl and a Keith needle in a subcuticular fashion Dermabond was placed for additional wound integrity A honeycomb dressing was placed over the incision  The patient was awakened from anesthesia and taken recovery room All counts were correct x3 There was no measurable intraoperative blood loss There was 1100 cc of clear fluid in the mass itself  The patient was given  2 g of Ancef preoperatively  The plan is to discharge her from the PACU and she will be seen in the office in a week and a half for postoperative visit  Prescriptions for Lortab and Zofran will be sent to her pharmacy for postoperative nausea and pain management  Florian Buff, MD 09/07/2020   12:41 PM

## 2020-09-07 NOTE — H&P (Signed)
Preoperative History and Physical  Vickie Sutton is a 19 y.o. G1P0 with No LMP recorded. Patient is pregnant. admitted for a G1P0 Estimated Date of Delivery: 02/14/21 [redacted]w[redacted]d with a 21 cm right adnexal mass, ovarian vs paraovarian,  Questionable paratubal. This represents the optimal timing  for removal of this large right adnexal mass during pregnancy.  Patient is aware that the sheer size of this mass is indication for removal.  She agrees with the management decsion for removal.  She understands ovarian removal is possible, depending on the source of the mass.  PMH:    Past Medical History:  Diagnosis Date  . ADD (attention deficit disorder)   . Anxiety   . Asthma   . Complication of anesthesia   . Dysrhythmia    rapid heart rate  . Headache    with rapid heart episodes  . Panic attacks   . Polycystic ovarian syndrome   . PONV (postoperative nausea and vomiting)   . POTS (postural orthostatic tachycardia syndrome)     PSH:     Past Surgical History:  Procedure Laterality Date  . ESOPHAGOGASTRODUODENOSCOPY    . ROUX-EN-Y GASTRIC BYPASS     August 2020 - [facility]  . TOOTH EXTRACTION N/A 12/12/2015   Procedure: EXTRACTION MOLARS - teeth one, sixteen, seventeen and thirty-two;  Surgeon: Diona Browner, DDS;  Location: [place];  Service: Oral Surgery;  Laterality: N/A;    POb/GynH:      OB History    Gravida  1   Para      Term      Preterm      AB      Living        SAB      TAB      Ectopic      Multiple      Live Births              SH:   Social History   Tobacco Use  . Smoking status: Never Smoker  . Smokeless tobacco: Never Used  Vaping Use  . Vaping Use: Never used  Substance Use Topics  . Alcohol use: No  . Drug use: No    FH:    Family History  Problem Relation Age of Onset  . Obesity Mother   . Diabetes Mellitus II Maternal Grandmother   . Heart disease Maternal Grandmother   . Yves Dill Parkinson White syndrome Brother   . Seizures  Maternal Aunt      Allergies: No Known Allergies  Medications:       Current Facility-Administered Medications:  .  bupivacaine liposome (EXPAREL) 1.3 % injection 266 mg, 20 mL, Infiltration, Once, Lyric Rossano, Mertie Clause, MD  Review of Systems:   Review of Systems  Constitutional: Negative for fever, chills, weight loss, malaise/fatigue and diaphoresis.  HENT: Negative for hearing loss, ear pain, nosebleeds, congestion, sore throat, neck pain, tinnitus and ear discharge.   Eyes: Negative for blurred vision, double vision, photophobia, pain, discharge and redness.  Respiratory: Negative for cough, hemoptysis, sputum production, shortness of breath, wheezing and stridor.   Cardiovascular: Negative for chest pain, palpitations, orthopnea, claudication, leg swelling and PND.  Gastrointestinal: Positive for abdominal pain. Negative for heartburn, nausea, vomiting, diarrhea, constipation, blood in stool and melena.  Genitourinary: Negative for dysuria, urgency, frequency, hematuria and flank pain.  Musculoskeletal: Negative for myalgias, back pain, joint pain and falls.  Skin: Negative for itching and rash.  Neurological: Negative for dizziness, tingling, tremors, sensory change,  speech change, focal weakness, seizures, loss of consciousness, weakness and headaches.  Endo/Heme/Allergies: Negative for environmental allergies and polydipsia. Does not bruise/bleed easily.  Psychiatric/Behavioral: Negative for depression, suicidal ideas, hallucinations, memory loss and substance abuse. The patient is not nervous/anxious and does not have insomnia.      PHYSICAL EXAM:  There were no vitals taken for this visit.    Vitals reviewed. Constitutional: She is oriented to person, place, and time. She appears well-developed and well-nourished.  HENT:  Head: Normocephalic and atraumatic.  Right Ear: External ear normal.  Left Ear: External ear normal.  Nose: Nose normal.  Mouth/Throat: Oropharynx is  clear and moist.  Eyes: Conjunctivae and EOM are normal. Pupils are equal, round, and reactive to light. Right eye exhibits no discharge. Left eye exhibits no discharge. No scleral icterus.  Neck: Normal range of motion. Neck supple. No tracheal deviation present. No thyromegaly present.  Cardiovascular: Normal rate, regular rhythm, normal heart sounds and intact distal pulses.  Exam reveals no gallop and no friction rub.   No murmur heard. Respiratory: Effort normal and breath sounds normal. No respiratory distress. She has no wheezes. She has no rales. She exhibits no tenderness.  GI: Soft. Bowel sounds are normal. She exhibits no distension and no mass. There is tenderness. There is no rebound and no guarding.  Genitourinary:       Vulva is normal without lesions Vagina is pink moist without discharge Cervix normal in appearance and pap is normal Uterus is enlarged, 17 weeks size Adnexa is per sonogram, see note below Musculoskeletal: Normal range of motion. She exhibits no edema and no tenderness.  Neurological: She is alert and oriented to person, place, and time. She has normal reflexes. She displays normal reflexes. No cranial nerve deficit. She exhibits normal muscle tone. Coordination normal.  Skin: Skin is warm and dry. No rash noted. No erythema. No pallor.  Psychiatric: She has a normal mood and affect. Her behavior is normal. Judgment and thought content normal.    Labs: Results for orders placed or performed during the hospital encounter of 09/06/20 (from the past 336 hour(s))  SARS CORONAVIRUS 2 (TAT 6-24 HRS) Nasopharyngeal Nasopharyngeal Swab   Collection Time: 09/06/20 10:13 AM   Specimen: Nasopharyngeal Swab  Result Value Ref Range   SARS Coronavirus 2 NEGATIVE NEGATIVE  Results for orders placed or performed in visit on 09/02/20 (from the past 336 hour(s))  POC Urinalysis Dipstick OB   Collection Time: 09/02/20 12:18 PM  Result Value Ref Range   Color, UA      Clarity, UA     Glucose, UA Negative Negative   Bilirubin, UA     Ketones, UA neg    Spec Grav, UA     Blood, UA neg    pH, UA     POC,PROTEIN,UA Negative Negative, Trace, Small (1+), Moderate (2+), Large (3+), 4+   Urobilinogen, UA     Nitrite, UA neg    Leukocytes, UA Negative Negative   Appearance     Odor      EKG: Orders placed or performed in visit on 12/01/19  . EKG    Imaging Studies: US Fetal Nuchal Translucency Measurement  Result Date: 08/13/2020 NUCHAL TRANSLUCENCY FOR INTEGRATED TESTING KATHYLEEN RADICE is in the office for nuchal translucency sonogram as part of an integrated screen. She is a 19 y.o. year old G1P0 with Estimated Date of Delivery: 02/14/21 by LMP now at  [redacted]w[redacted]d weeks gestation. Thus far the pregnancy has  been complicated by HX of PCOS,gastric bypass. GESTATION: SINGLETON FETAL ACTIVITY:          Heart rate         152          The fetus is active. AMNIOTIC FLUID: The amniotic fluid volume is  normal visually PLACENTA LOCALIZATION:  anterior GRADE 0 CERVIX: Measures appears closed ADNEXA: Enlarged ovaries,hx of PCOS,simple right adnexal cyst adjacent to right ovary  21 x 11.5 x 10.9 cm (seen on outside ultrasound),unable to get color flow GESTATIONAL AGE AND  BIOMETRICS: Gestational criteria: Estimated Date of Delivery: 02/14/21 by LMP now at [redacted]w[redacted]d Previous Scans:1 outside ultrasound                      CROWN RUMP LENGTH           73.90 mm         13+3 weeks NUCHAL TRANSLUCENCY           1.4 mm         normal                                                                   AVERAGE EGA(BY THIS SCAN):  13+3 weeks The fetal nasal bone is identified.  TECHNICIAN COMMENTS: [place] 13+3 wks,measurements c/w dates,anterior placenta,FHR 152 BPM,NB present,NT 1.4 mm,enlarged ovaries,hx of PCOS,simple right adnexal cyst adjacent to right ovary  21 x 11.5 x 10.9 cm (seen on outside ultrasound),unable to get color flow,no pelvic pain The patient will have the first blood draw of her  integrated screening today and the second draw in approximately 4 weeks. Amber Heide Guile 08/12/2020 1:19 PM Clinical Impression and recommendations: I have reviewed the sonogram results above, combined with the patient's current clinical course, below are my impressions and any appropriate recommendations for management based on the sonographic findings. 1.  G1P0  Estimated Date of Delivery: Estimated Date of Delivery: 02/14/21 by  LMP and confirmed by today's sonographic dating 2.  Normal fetal sonographic findings, specifically normal nuchal translucency and present fetal nasal bone Additionally the cranium, both choroid plexuses, zygomatic arch, mandible, 4 equal extremities, stomach, bladder and anterior abdomen are all noted. 3.  Normal general sonographic findings other than large simple cyst right adnexa, adjacent to a visualized right ovary, will follow Recommend routine prenatal care based on this sonogram or as clinically indicated Jonnie Kind 08/13/2020 8:42 AM                                                     Assessment: G1P0 Estimated Date of Delivery: 02/14/21 [redacted]w[redacted]d  21 cm right adnexal mass, questionable origin, ovarian vs paraovarian Patient Active Problem List   Diagnosis Date Noted  . Ovarian cyst, right 08/13/2020  . Supervision of normal first pregnancy 08/08/2020  . History of Roux-en-Y gastric bypass 08/08/2020  . PCOS (polycystic ovarian syndrome) 08/08/2020  . Anxiety 08/08/2020  . Syncope 07/25/2020  . POTS (postural orthostatic tachycardia syndrome) 05/03/2020  . Asthma 03/23/2019    Plan: Mini lap with removal of mass, indicated procedures  Pt is aware of risks of infection, excessive bleeding damage to other organs and small risks to the pregnancy from the surgery itself  She agrees with management decision for removal.  Florian Buff 09/07/2020 8:47 AM

## 2020-09-07 NOTE — Discharge Instructions (Signed)
Exploratory Laparotomy, Adult, Care After °This sheet gives you information about how to care for yourself after your procedure. Your health care provider may also give you more specific instructions. If you have problems or questions, contact your health care provider. °What can I expect after the procedure? °After the procedure, it is common to have: °· Abdominal soreness. °· Fatigue. °· A sore throat from the tube in your throat. °· A lack of appetite. °Follow these instructions at home: °Medicines °· Take over-the-counter and prescription medicines only as told by your health care provider. °· If you were prescribed an antibiotic medicine, take it as told by your health care provider. Do not stop taking the antibiotic even if you start to feel better. °· Do not drive or operate heavy machinery while taking pain medicine. °· If you are taking prescription pain medicine, take actions to prevent or treat constipation. Your health care provider may recommend that you: °? Drink enough fluid to keep your urine pale yellow. °? Eat foods that are high in fiber, such as fresh fruits and vegetables, whole grains, and beans. °? Limit foods that are high in fat and processed sugars, such as fried or sweet foods. °? Take an over-the-counter or prescription medicine for constipation. Undergoing surgery and taking pain medicines can make constipation worse. °Incision care ° °· Follow instructions from your health care provider about how to take care of your incision. Make sure you: °? Wash your hands with soap and water before you change your bandage (dressing). If soap and water are not available, use hand sanitizer. °? Change your dressing as told by your health care provider. °? Leave stitches (sutures), skin glue, or adhesive strips in place. These skin closures may need to stay in place for 2 weeks or longer. If adhesive strip edges start to loosen and curl up, you may trim the loose edges. Do not remove adhesive strips  completely unless your health care provider tells you to do that. °· If you were sent home with a drain, follow instructions from your health care provider about how to care for it. °· Check your incision area every day for signs of infection. Check for: °? Redness, swelling, or pain. °? Fluid or blood. °? Warmth. °? Pus or a bad smell. °Activity ° °· Rest as told by your health care provider. °? Avoid sitting for a long time without moving. Get up to take short walks every 1-2 hours. This is important to improve blood flow and breathing. Ask for help if you feel weak or unsteady. °· Do not lift anything that is heavier than 5 lb (2.2 kg), or the limit that your health care provider tells you, until he or she says that it is safe. °· Ask your health care provider when you can start to do your usual activities again, such as driving, going back to work, and having sex. °Eating and drinking °· You may eat what you usually eat. Include lots of whole grains, fruits, and vegetables in your diet. This will help to prevent constipation. °· Drink enough fluid to keep your urine pale yellow. °Bathing °· Keep your incision clean and dry. Clean it as often as told by your health care provider: °? Gently wash the incision with soap and water. °? Rinse the incision with water to remove all soap. °? Pat the incision dry with a clean towel. Do not rub the incision. °· You may take showers after 48 hours. °· Do not take baths,   swim, or use a hot tub until your health care provider says it is okay to do so. °General instructions °· Do not use any products that contain nicotine or tobacco, such as cigarettes and e-cigarettes. These can delay healing after surgery. If you need help quitting, ask your health care provider. °· Wear compression stockings as told by your health care provider. These stockings help to prevent blood clots and reduce swelling in your legs. °· Keep all follow-up visits as told by your health care provider.  This is important. °Contact a health care provider if: °· You have a fever. °· You have chills. °· Your pain medicine is not helping. °· You have constipation or diarrhea. °· You have nausea or vomiting. °· You have drainage, redness, swelling, or pain at your incision site. °Get help right away if: °· Your pain is getting worse. °· You have not had a bowel movement for more than 3 days. °· You have ongoing (persistent) vomiting. °· The edges of your incision open up. °· You have warmth, tenderness, and swelling in your calf. °· You have trouble breathing. °· You have chest pain. °These symptoms may represent a serious problem that is an emergency. Do not wait to see if the symptoms will go away. Get medical help right away. Call your local emergency services (911 in the [place]). Do not drive yourself to the hospital. °Summary °· Abdominal soreness is common after exploratory laparotomy. Take over-the-counter and prescription pain medicines only as told by your health care provider. °· Follow instructions from your health care provider about how to take care of your incision. Do not take baths, swim, or use a hot tub until your health care provider says it is okay to do so. °· Watch for signs and symptoms of infection after surgery, including fever, chills, drainage from your incision, and worsening abdominal pain. °This information is not intended to replace advice given to you by your health care provider. Make sure you discuss any questions you have with your health care provider. °Document Revised: 02/02/2019 Document Reviewed: 12/20/2017 °Elsevier Patient Education © 2020 [employer] Inc[employer] ° °

## 2020-09-07 NOTE — Anesthesia Procedure Notes (Signed)
Procedure Name: Intubation Date/Time: 09/07/2020 11:39 AM Performed by: Griffin Dakin, CRNA Pre-anesthesia Checklist: Patient identified, Emergency Drugs available, Suction available and Patient being monitored Patient Re-evaluated:Patient Re-evaluated prior to induction Oxygen Delivery Method: Circle system utilized Preoxygenation: Pre-oxygenation with 100% oxygen Induction Type: IV induction, Rapid sequence and Cricoid Pressure applied Laryngoscope Size: Mac and 4 Grade View: Grade I Tube type: Oral Tube size: 7.0 mm Number of attempts: 1 Airway Equipment and Method: Stylet and Oral airway Placement Confirmation: ETT inserted through vocal cords under direct vision,  positive ETCO2 and breath sounds checked- equal and bilateral Secured at: 23 cm Tube secured with: Tape Dental Injury: Teeth and Oropharynx as per pre-operative assessment

## 2020-09-07 NOTE — Transfer of Care (Signed)
Immediate Anesthesia Transfer of Care Note  Patient: Vickie Sutton  Procedure(s) Performed: EXPLORATORY LAPAROTOMY WITH EXTRACTION  OVARIAN CYST (N/A Abdomen)  Patient Location: PACU  Anesthesia Type:General  Level of Consciousness: awake, alert  and oriented  Airway & Oxygen Therapy: Patient Spontanous Breathing  Post-op Assessment: Report given to RN and Post -op Vital signs reviewed and stable  Post vital signs: Reviewed and stable  Last Vitals:  Vitals Value Taken Time  BP 101/51 09/07/20 1242  Temp    Pulse 74 09/07/20 1248  Resp 18 09/07/20 1248  SpO2 100 % 09/07/20 1248  Vitals shown include unvalidated device data.  Last Pain:  Vitals:   09/07/20 0914  TempSrc:   PainSc: 4       Patients Stated Pain Goal: 4 (01/48/40 3979)  Complications: No complications documented.

## 2020-09-07 NOTE — Progress Notes (Signed)
[facility] nurse, Larene Beach checked fetal heart rate.  Asked [place] to call her again when patient in PACU.  Her number [phone]

## 2020-09-08 ENCOUNTER — Encounter (HOSPITAL_COMMUNITY): Payer: Self-pay | Admitting: Obstetrics & Gynecology

## 2020-09-08 ENCOUNTER — Other Ambulatory Visit: Payer: Self-pay | Admitting: *Deleted

## 2020-09-08 LAB — TYPE AND SCREEN
ABO/RH(D): B POS
Antibody Screen: NEGATIVE

## 2020-09-08 LAB — SURGICAL PATHOLOGY

## 2020-09-08 NOTE — Patient Outreach (Signed)
[facility]) Care Management  09/08/2020  Vickie Sutton [birth year] [id number]   Transition of care call/case closure   Referral received:09/06/20 Initial outreach:09/08/20 Insurance: [employer]    Subjective: Initial successful telephone call to patient's preferred number in order to complete transition of care assessment; 2 HIPAA identifiers verified. Explained purpose of call and completed transition of care assessment.  Vickie Sutton states that she is doing okay, sore. She reports getting relief with prn pain medications. She reports having difficulty episode of chills, chest discomfort , getting relief after taking pain medication and resting in bed. She reports resting okay on last night but having to get up to bathroom a few times she attributes to being pregnant.She discussed her condition of POTS that is followed by Dr. Domenic Polite.  She reports tolerating small meals having some nausea, gets relief with PRN zofran. Reinforced small frequent meals and staying hydrated. She denies chest pain, shortness of breath, dizziness today. She monitors her blood pressure at home reports reading 105/70, reinforced patient notifying MD of concerns if recurrent  chest discomfort . She reports dressing to incision area intact without signs of infection.  She denies bowel or bladder problems. Patient mom and fiance are assisting with her  recovery.   She does not have the hospital indemnity, she has started process of FMLA, shortterm disability.  She does not use  a [facility] outpatient pharmacy.  She  denies educational needs related to staying safe during the COVID 19 pandemic.    Objective:  Vickie Sutton was hospitalized at [facility] on 09/07/20 for 6 hour stay  for Exploratory Laparotomy with extraction of ovarian cyst, 2nd Trimester Pregancy  Comorbidities include: Polycystic ovarian syndrome, POTS ( postural orthostatic tachycardia syndrome)  She was discharged to home on 09/07/20 without  the need for home health services or DME.   Assessment:  Patient voices good understanding of all discharge instructions.  See transition of care flowsheet for assessment details.   Plan:  Reviewed hospital discharge diagnosis of Exploratory Laparotomy and extraction of ovarian cyst  and discharge treatment plan using hospital discharge instructions, assessing medication adherence, reviewing problems requiring provider notification, and discussing the importance of follow up with surgeon, primary care provider and/or specialists as directed.  Reviewed [place] healthy lifestyle program information to receive discounted premium for  2022   Step 1: Get  your annual physical  Step 2: Complete your health assessment  Step 3:Identify your current health status and complete the corresponding action step between January 1, and August 24, 2020.     No ongoing care management needs identified so will close case to [place] [facility] services and route successful outreach letter with [place] Management pamphlet and 24 Hour Nurse Line Magnet to [place] [facility] clinical pool to be mailed to patient's home address.  Thanked patient for their services to [facility].  Joylene Draft, RN, BSN  [place] Management Coordinator  [phone]- Mobile [phone]- Toll Free Main Office

## 2020-09-12 ENCOUNTER — Encounter (HOSPITAL_COMMUNITY): Payer: Self-pay | Admitting: Obstetrics & Gynecology

## 2020-09-12 NOTE — Anesthesia Postprocedure Evaluation (Signed)
Anesthesia Post Note  Patient: Vickie Sutton  Procedure(s) Performed: EXPLORATORY LAPAROTOMY WITH EXTRACTION  OVARIAN CYST (N/A Abdomen)     Patient location during evaluation: PACU Anesthesia Type: General Level of consciousness: awake and alert Pain management: pain level controlled Vital Signs Assessment: post-procedure vital signs reviewed and stable Respiratory status: spontaneous breathing, nonlabored ventilation, respiratory function stable and patient connected to nasal cannula oxygen Cardiovascular status: blood pressure returned to baseline and stable Postop Assessment: no apparent nausea or vomiting Anesthetic complications: no   No complications documented.  Last Vitals:  Vitals:   09/07/20 1355 09/07/20 1410  BP: (!) 98/57 102/61  Pulse: 62 68  Resp: 20 16  Temp:  36.5 C  SpO2: 98% 100%    Last Pain:  Vitals:   09/07/20 1410  TempSrc:   PainSc: 4                  Tristin Vandeusen

## 2020-09-16 ENCOUNTER — Ambulatory Visit: Payer: 59

## 2020-09-16 ENCOUNTER — Other Ambulatory Visit: Payer: Self-pay

## 2020-09-16 ENCOUNTER — Encounter: Payer: 59 | Admitting: Obstetrics and Gynecology

## 2020-09-16 ENCOUNTER — Ambulatory Visit (INDEPENDENT_AMBULATORY_CARE_PROVIDER_SITE_OTHER): Payer: 59 | Admitting: Obstetrics and Gynecology

## 2020-09-16 DIAGNOSIS — Z331 Pregnant state, incidental: Secondary | ICD-10-CM

## 2020-09-16 DIAGNOSIS — Z3A18 18 weeks gestation of pregnancy: Secondary | ICD-10-CM

## 2020-09-16 DIAGNOSIS — Z1389 Encounter for screening for other disorder: Secondary | ICD-10-CM

## 2020-09-16 DIAGNOSIS — D270 Benign neoplasm of right ovary: Secondary | ICD-10-CM

## 2020-09-16 DIAGNOSIS — Z3401 Encounter for supervision of normal first pregnancy, first trimester: Secondary | ICD-10-CM

## 2020-09-16 NOTE — Progress Notes (Signed)
   LOW-RISK PREGNANCY VISIT Patient name: Vickie Sutton MRN [id number]  Date of birth: December 01, 2001 Chief Complaint:   Routine Prenatal Visit (Ultrasound)  History of Present Illness:   Vickie Sutton is a 19 y.o. G1P0 female at [redacted]w[redacted]d with an Estimated Date of Delivery: 02/14/21 being seen today for ongoing management of a low-risk pregnancy. S/p ovarian cystectomy heres for postop check Pathology:FINAL MICROSCOPIC DIAGNOSIS:   A. CYST, RIGHT OVARIAN, EXCISION:  - Benign serous cystadenofibroma.  - No evidence of borderline change or malignancy.     Depression screen [facility] 2/9 08/08/2020  Decreased Interest 0  Down, Depressed, Hopeless 0  PHQ - 2 Score 0  Altered sleeping 0  Tired, decreased energy 2  Change in appetite 0  Feeling bad or failure about yourself  0  Trouble concentrating 0  Moving slowly or fidgety/restless 0  Suicidal thoughts 0  PHQ-9 Score 2    Today she reports mild discomfort since expl lap for ovarian cyst..  .  .   . denies leaking of fluid. Review of Systems:   Pertinent items are noted in HPI Denies abnormal vaginal discharge w/ itching/odor/irritation, headaches, visual changes, shortness of breath, chest pain, abdominal pain, severe nausea/vomiting, or problems with urination or bowel movements unless otherwise stated above. Pertinent History Reviewed:  Reviewed past medical,surgical, social, obstetrical and family history.  Reviewed problem list, medications and allergies. Physical Assessment:  There were no vitals filed for this visit.There is no height or weight on file to calculate BMI.        Physical Examination:   General appearance: Well appearing, and in no distress  Mental status: Alert, oriented to person, place, and time  Skin: Warm & dry  Cardiovascular: Normal heart rate noted  Respiratory: Normal respiratory effort, no distress  Abdomen: Soft, gravid, nontender incison intact with glue, dressing removed.  Pelvic: Cervical exam  deferred         Extremities:    Fetal Status:          Chaperone: Humphrey Rolls scribe    No results found for this or any previous visit (from the past 24 hour(s)).  Assessment & Plan:  1) Low-risk pregnancy G1P0 at [redacted]w[redacted]d with an Estimated Date of Delivery: 02/14/21   2) postop check, stable s.p cystadenofibroma removal , '   Meds: No orders of the defined types were placed in this encounter.  Labs/procedures today: none  Plan:  Continue routine obstetrical care  Next visit: prefers in person for u/s anatomy  Reviewed Follow-up: No follow-ups on file.  Orders Placed This Encounter  Procedures  . POC Urinalysis Dipstick OB   Jonnie Kind CNM, [facility] 09/16/2020 10:12 PM

## 2020-09-19 ENCOUNTER — Encounter (HOSPITAL_COMMUNITY): Payer: Self-pay | Admitting: Obstetrics & Gynecology

## 2020-09-20 ENCOUNTER — Ambulatory Visit (INDEPENDENT_AMBULATORY_CARE_PROVIDER_SITE_OTHER): Payer: 59 | Admitting: Obstetrics & Gynecology

## 2020-09-20 VITALS — BP 95/62 | HR 88 | Wt 163.0 lb

## 2020-09-20 DIAGNOSIS — Z3401 Encounter for supervision of normal first pregnancy, first trimester: Secondary | ICD-10-CM

## 2020-09-20 NOTE — Progress Notes (Signed)
  HPI: Patient returns for routine postoperative follow-up having undergone exp lap with right ovarian cystectomy 09/07/20 .  The patient's immediate postoperative recovery has been unremarkable. Since hospital discharge the patient reports no problems.   Current Outpatient Medications: albuterol (PROAIR HFA) 108 (90 BASE) MCG/ACT inhaler, Inhale 2 puffs into the lungs 4 (four) times daily as needed for wheezing or shortness of breath. , Disp: , Rfl:  Cyanocobalamin (VITAMIN B12 PO), Take 1 tablet by mouth daily. , Disp: , Rfl:  Docusate Sodium (STOOL SOFTENER LAXATIVE PO), Take 1 tablet by mouth daily. , Disp: , Rfl:  Ferrous Sulfate (IRON PO), Take 1 tablet by mouth daily., Disp: , Rfl:  FOLIC ACID PO, Take 1 tablet by mouth daily., Disp: , Rfl:  Polyethylene Glycol 3350 (MIRALAX PO), Take 17 g by mouth daily as needed (constipation). , Disp: , Rfl:  Prenatal Vit-Fe Fumarate-FA (PRENATAL MULTIVITAMIN) TABS tablet, Take 1 tablet by mouth daily. , Disp: , Rfl:  HYDROcodone-acetaminophen (NORCO/VICODIN) 5-325 MG tablet, Take 1 tablet by mouth every 6 (six) hours as needed. (Patient not taking: Reported on 09/20/2020), Disp: 30 tablet, Rfl: 0 ondansetron (ZOFRAN ODT) 8 MG disintegrating tablet, Take 1 tablet (8 mg total) by mouth every 8 (eight) hours as needed for nausea or vomiting. (Patient not taking: Reported on 09/20/2020), Disp: 20 tablet, Rfl: 0  No current facility-administered medications for this visit.    Blood pressure 95/62, pulse 88, weight 163 lb (73.9 kg).  Physical Exam: Abdomen is soft benign normal post op Incision healing well  FHR 160  Diagnostic Tests:   Pathology: Benign ovarian neoplasm  Impression: S/p right ovarian cystectomy  Plan: Routine OB care  Follow up: 2  weeks  Florian Buff, MD

## 2020-09-27 ENCOUNTER — Encounter: Payer: Self-pay | Admitting: *Deleted

## 2020-09-27 DIAGNOSIS — Z3402 Encounter for supervision of normal first pregnancy, second trimester: Secondary | ICD-10-CM

## 2020-10-10 ENCOUNTER — Other Ambulatory Visit: Payer: Self-pay | Admitting: Obstetrics & Gynecology

## 2020-10-10 DIAGNOSIS — Z3401 Encounter for supervision of normal first pregnancy, first trimester: Secondary | ICD-10-CM

## 2020-10-10 DIAGNOSIS — Z363 Encounter for antenatal screening for malformations: Secondary | ICD-10-CM

## 2020-10-11 ENCOUNTER — Ambulatory Visit (INDEPENDENT_AMBULATORY_CARE_PROVIDER_SITE_OTHER): Payer: 59

## 2020-10-11 ENCOUNTER — Encounter: Payer: Self-pay | Admitting: Women's Health

## 2020-10-11 ENCOUNTER — Ambulatory Visit (INDEPENDENT_AMBULATORY_CARE_PROVIDER_SITE_OTHER): Payer: 59 | Admitting: Women's Health

## 2020-10-11 VITALS — BP 106/63 | HR 73 | Wt 171.0 lb

## 2020-10-11 DIAGNOSIS — Z1389 Encounter for screening for other disorder: Secondary | ICD-10-CM

## 2020-10-11 DIAGNOSIS — Z3402 Encounter for supervision of normal first pregnancy, second trimester: Secondary | ICD-10-CM

## 2020-10-11 DIAGNOSIS — Z363 Encounter for antenatal screening for malformations: Secondary | ICD-10-CM

## 2020-10-11 DIAGNOSIS — Z3401 Encounter for supervision of normal first pregnancy, first trimester: Secondary | ICD-10-CM

## 2020-10-11 DIAGNOSIS — Z3A22 22 weeks gestation of pregnancy: Secondary | ICD-10-CM | POA: Diagnosis not present

## 2020-10-11 DIAGNOSIS — Z331 Pregnant state, incidental: Secondary | ICD-10-CM

## 2020-10-11 LAB — POCT URINALYSIS DIPSTICK OB
Blood, UA: NEGATIVE
Glucose, UA: NEGATIVE
Ketones, UA: NEGATIVE
Leukocytes, UA: NEGATIVE
Nitrite, UA: NEGATIVE
POC,PROTEIN,UA: NEGATIVE

## 2020-10-11 NOTE — Patient Instructions (Addendum)
Vickie Sutton, I greatly value your feedback.  If you receive a survey following your visit with [place] today, we appreciate you taking the time to fill it out.  Thanks, Knute Neu, CNM, WHNP-BC   You will have your sugar test next visit.  Please do not eat or drink anything after midnight the night before you come, not even water.  You will be here for at least two hours.  Please make an appointment online for the bloodwork at [email or web address] for 8:30am (or as close to this as possible). Make sure you select the [facility] service center. The day of the appointment, check in with our office first, then you will go to [place] to start the sugar test.    [place] [facility] [place] [address]) Entrance C, located off of [address] parking  Go to [email or web address] to register for FREE online childbirth classes   Call the office [phone]) or go to [facility] if:  You begin to have strong, frequent contractions  Your water breaks.  Sometimes it is a big gush of fluid, sometimes it is just a trickle that keeps getting your panties wet or running down your legs  You have vaginal bleeding.  It is normal to have a small amount of spotting if your cervix was checked.   You don't feel your baby moving like normal.  If you don't, get you something to eat and drink and lay down and focus on feeling your baby move.   If your baby is still not moving like normal, you should call the office or go to [place] Pediatricians/Family Doctors:  [facility] [phone]                 [place] [phone] (usually not accepting new patients unless you have family there already, you are always welcome to call and ask)       [facility] Department [phone]       [facility] Pediatricians/Family Doctors:   [facility]: [phone]  [facility]: [phone]  [facility]: [place] Doctors:   [facility]: [facility]: [place]:  [place]: [phone]   Meet the Provider [place] for [place] is now offering FREE monthly 1-hour virtual Zoom sessions for new, current, and prospective patients.        During these sessions, you can:   Learn about our practice, model of care, services   Get answers to questions about pregnancy and birth during [place] your provider's brain about anything else!    Sessions will be hosted by [employer] for [employer], Engineer, materials, Physicians and Midwives          No registration required      2021 Dates:      All at 6pm     October 21st     November 18th   December 16th     January 20th  February 17th    To join one of these meetings, a few minutes before it is set to start:     Copy/paste the link into your web browser:  [email or web address]    OR  Scan the QR code  below (open up your camera and point towards QR code; click on tab that pops up on your phone ("zoom")      Home Blood Pressure Monitoring for Patients   Your provider has recommended that you check your blood pressure (BP) at least once a week at home. If you do not have a blood pressure cuff at home, one will be provided for you. Contact your provider if you have not received your monitor within 1 week.   Helpful Tips for Accurate Home Blood Pressure Checks  . Don't smoke, exercise, or drink caffeine 30 minutes before checking your BP . Use the restroom before checking your BP (a full bladder can raise your pressure) . Relax in a comfortable upright chair . Feet on the ground . Left arm  resting comfortably on a flat surface at the level of your heart . Legs uncrossed . Back supported . Sit quietly and don't talk . Place the cuff on your bare arm . Adjust snuggly, so that only two fingertips can fit between your skin and the top of the cuff . Check 2 readings separated by at least one minute . Keep a log of your BP readings . For a visual, please reference this diagram: http://ccnc.care/bpdiagram  Provider Name: [facility]     Phone: [phone]  Zone 1: ALL CLEAR  Continue to monitor your symptoms:  . BP reading is less than 140 (top number) or less than 90 (bottom number)  . No right upper stomach pain . No headaches or seeing spots . No feeling nauseated or throwing up . No swelling in face and hands  Zone 2: CAUTION Call your doctor's office for any of the following:  . BP reading is greater than 140 (top number) or greater than 90 (bottom number)  . Stomach pain under your ribs in the middle or right side . Headaches or seeing spots . Feeling nauseated or throwing up . Swelling in face and hands  Zone 3: EMERGENCY  Seek immediate medical care if you have any of the following:  . BP reading is greater than160 (top number) or greater than 110 (bottom number) . Severe headaches not improving with Tylenol . Serious difficulty catching your breath . Any worsening symptoms from Zone 2   Second Trimester of Pregnancy The second trimester is from week 13 through week 28, months 4 through 6. The second trimester is often a time when you feel your best. Your body has also adjusted to being pregnant, and you begin to feel better physically. Usually, morning sickness has lessened or quit completely, you may have more energy, and you may have an increase in appetite. The second trimester is also a time when the fetus is growing rapidly. At the end of the sixth month, the fetus is about 9 inches long and weighs about 1 pounds. You will likely begin to feel the  baby move (quickening) between 18 and 20 weeks of the pregnancy. BODY CHANGES Your body goes through many changes during pregnancy. The changes vary from woman to woman.   Your weight will continue to increase. You will notice your lower abdomen bulging out.  You may begin to get stretch marks on your hips, abdomen, and breasts.  You may develop headaches that can be relieved by medicines approved by your health care provider.  You may urinate more often because the fetus is pressing on your bladder.  You may develop or continue to have heartburn as a result of  your pregnancy.  You may develop constipation because certain hormones are causing the muscles that push waste through your intestines to slow down.  You may develop hemorrhoids or swollen, bulging veins (varicose veins).  You may have back pain because of the weight gain and pregnancy hormones relaxing your joints between the bones in your pelvis and as a result of a shift in weight and the muscles that support your balance.  Your breasts will continue to grow and be tender.  Your gums may bleed and may be sensitive to brushing and flossing.  Dark spots or blotches (chloasma, mask of pregnancy) may develop on your face. This will likely fade after the baby is born.  A dark line from your belly button to the pubic area (linea nigra) may appear. This will likely fade after the baby is born.  You may have changes in your hair. These can include thickening of your hair, rapid growth, and changes in texture. Some women also have hair loss during or after pregnancy, or hair that feels dry or thin. Your hair will most likely return to normal after your baby is born. WHAT TO EXPECT AT YOUR PRENATAL VISITS During a routine prenatal visit:  You will be weighed to make sure you and the fetus are growing normally.  Your blood pressure will be taken.  Your abdomen will be measured to track your baby's growth.  The fetal heartbeat  will be listened to.  Any test results from the previous visit will be discussed. Your health care provider may ask you:  How you are feeling.  If you are feeling the baby move.  If you have had any abnormal symptoms, such as leaking fluid, bleeding, severe headaches, or abdominal cramping.  If you have any questions. Other tests that may be performed during your second trimester include:  Blood tests that check for:  Low iron levels (anemia).  Gestational diabetes (between 24 and 28 weeks).  Rh antibodies.  Urine tests to check for infections, diabetes, or protein in the urine.  An ultrasound to confirm the proper growth and development of the baby.  An amniocentesis to check for possible genetic problems.  Fetal screens for spina bifida and Down syndrome. HOME CARE INSTRUCTIONS   Avoid all smoking, herbs, alcohol, and unprescribed drugs. These chemicals affect the formation and growth of the baby.  Follow your health care provider's instructions regarding medicine use. There are medicines that are either safe or unsafe to take during pregnancy.  Exercise only as directed by your health care provider. Experiencing uterine cramps is a good sign to stop exercising.  Continue to eat regular, healthy meals.  Wear a good support bra for breast tenderness.  Do not use hot tubs, steam rooms, or saunas.  Wear your seat belt at all times when driving.  Avoid raw meat, uncooked cheese, cat litter boxes, and soil used by cats. These carry germs that can cause birth defects in the baby.  Take your prenatal vitamins.  Try taking a stool softener (if your health care provider approves) if you develop constipation. Eat more high-fiber foods, such as fresh vegetables or fruit and whole grains. Drink plenty of fluids to keep your urine clear or pale yellow.  Take warm sitz baths to soothe any pain or discomfort caused by hemorrhoids. Use hemorrhoid cream if your health care  provider approves.  If you develop varicose veins, wear support hose. Elevate your feet for 15 minutes, 3-4 times a day. Limit salt in  your diet.  Avoid heavy lifting, wear low heel shoes, and practice good posture.  Rest with your legs elevated if you have leg cramps or low back pain.  Visit your dentist if you have not gone yet during your pregnancy. Use a soft toothbrush to brush your teeth and be gentle when you floss.  A sexual relationship may be continued unless your health care provider directs you otherwise.  Continue to go to all your prenatal visits as directed by your health care provider. SEEK MEDICAL CARE IF:   You have dizziness.  You have mild pelvic cramps, pelvic pressure, or nagging pain in the abdominal area.  You have persistent nausea, vomiting, or diarrhea.  You have a bad smelling vaginal discharge.  You have pain with urination. SEEK IMMEDIATE MEDICAL CARE IF:   You have a fever.  You are leaking fluid from your vagina.  You have spotting or bleeding from your vagina.  You have severe abdominal cramping or pain.  You have rapid weight gain or loss.  You have shortness of breath with chest pain.  You notice sudden or extreme swelling of your face, hands, ankles, feet, or legs.  You have not felt your baby move in over an hour.  You have severe headaches that do not go away with medicine.  You have vision changes. Document Released: 12/04/2001 Document Revised: 12/15/2013 Document Reviewed: 02/10/2013 [facility] Patient Information 2015 [place], [place]. This information is not intended to replace advice given to you by your health care provider. Make sure you discuss any questions you have with your health care provider.

## 2020-10-11 NOTE — Progress Notes (Addendum)
[place] 22 wks,cephalic,anterior placenta gr 0,normal ovaries,fhr 146 bpm,cx 2.5 cm,svp of fluid 4.4 cm,efw 499 g 53%,no obvious abnormalities,anatomy complete

## 2020-10-11 NOTE — Progress Notes (Signed)
LOW-RISK PREGNANCY VISIT Patient name: Vickie Sutton MRN [id number]  Date of birth: June 30, 2001 Chief Complaint:   Routine Prenatal Visit (u/s)  History of Present Illness:   Vickie Sutton is a 19 y.o. G1P0 female at [redacted]w[redacted]d with an Estimated Date of Delivery: 02/14/21 being seen today for ongoing management of a low-risk pregnancy.  Depression screen [facility] 2/9 08/08/2020  Decreased Interest 0  Down, Depressed, Hopeless 0  PHQ - 2 Score 0  Altered sleeping 0  Tired, decreased energy 2  Change in appetite 0  Feeling bad or failure about yourself  0  Trouble concentrating 0  Moving slowly or fidgety/restless 0  Suicidal thoughts 0  PHQ-9 Score 2    Today she reports no complaints. Contractions: Not present. Vag. Bleeding: None.  Movement: Present. denies leaking of fluid. Review of Systems:   Pertinent items are noted in HPI Denies abnormal vaginal discharge w/ itching/odor/irritation, headaches, visual changes, shortness of breath, chest pain, abdominal pain, severe nausea/vomiting, or problems with urination or bowel movements unless otherwise stated above. Pertinent History Reviewed:  Reviewed past medical,surgical, social, obstetrical and family history.  Reviewed problem list, medications and allergies. Physical Assessment:   Vitals:   10/11/20 1522  BP: 106/63  Pulse: 73  Weight: 171 lb (77.6 kg)  Body mass index is 28.46 kg/m.        Physical Examination:   General appearance: Well appearing, and in no distress  Mental status: Alert, oriented to person, place, and time  Skin: Warm & dry  Cardiovascular: Normal heart rate noted  Respiratory: Normal respiratory effort, no distress  Abdomen: Soft, gravid, nontender  Pelvic: Cervical exam deferred         Extremities: Edema: None  Fetal Status: Fetal Heart Rate (bpm): 146 u/s   Movement: Present   [place] 22 wks,cephalic,anterior placenta gr 0,normal ovaries,fhr 146 bpm,cx 2.5 cm,svp of fluid 4.4 cm,efw 499 g 53%,no  obvious abnormalities,limited view of spine because of fetal position,please have pt come back after appt   Chaperone: n/a    Results for orders placed or performed in visit on 10/11/20 (from the past 24 hour(s))  POC Urinalysis Dipstick OB   Collection Time: 10/11/20  3:19 PM  Result Value Ref Range   Color, UA     Clarity, UA     Glucose, UA Negative Negative   Bilirubin, UA     Ketones, UA neg    Spec Grav, UA     Blood, UA neg    pH, UA     POC,PROTEIN,UA Negative Negative, Trace, Small (1+), Moderate (2+), Large (3+), 4+   Urobilinogen, UA     Nitrite, UA neg    Leukocytes, UA Negative Negative   Appearance     Odor      Assessment & Plan:  1) Low-risk pregnancy G1P0 at [redacted]w[redacted]d with an Estimated Date of Delivery: 02/14/21   2) H/O gastric bypass  3) S/P 20cm Rt ovarian cystadenofribroma> on 9/15  4) POTS   Meds: No orders of the defined types were placed in this encounter.  Labs/procedures today: anatomy u/s, 2nd IT  Plan:  Continue routine obstetrical care  Next visit: prefers will be in person for pn2    Reviewed: Preterm labor symptoms and general obstetric precautions including but not limited to vaginal bleeding, contractions, leaking of fluid and fetal movement were reviewed in detail with the patient.  All questions were answered. Has home bp cuff.  Check bp weekly, let us know  if >140/90.   Follow-up: Return in about 4 weeks (around 11/08/2020) for LROB, PN2, in person, CNM.  Orders Placed This Encounter  Procedures  . INTEGRATED 2  . POC Urinalysis Dipstick OB   Roma Schanz CNM, [facility] 10/11/2020 3:53 PM

## 2020-10-13 LAB — INTEGRATED 2
AFP MoM: 1.84
Alpha-Fetoprotein: 116.9 ng/mL
Crown Rump Length: 73.9 mm
DIA MoM: 0.59
DIA Value: 135.9 pg/mL
Estriol, Unconjugated: 3.19 ng/mL
Gest. Age on Collection Date: 13.3 weeks
Gestational Age: 21.9 weeks
Maternal Age at EDD: 21.5 yr
Nuchal Translucency (NT): 1.4 mm
Nuchal Translucency MoM: 0.82
Number of Fetuses: 1
PAPP-A MoM: 0.58
PAPP-A Value: 667.1 ng/mL
Test Results:: NEGATIVE
Weight: 169 [lb_av]
Weight: 169 [lb_av]
hCG MoM: 2.07
hCG Value: 41.3 IU/mL
uE3 MoM: 0.98

## 2020-10-18 DIAGNOSIS — Z23 Encounter for immunization: Secondary | ICD-10-CM | POA: Diagnosis not present

## 2020-10-28 DIAGNOSIS — K912 Postsurgical malabsorption, not elsewhere classified: Secondary | ICD-10-CM | POA: Diagnosis not present

## 2020-11-07 ENCOUNTER — Inpatient Hospital Stay (HOSPITAL_COMMUNITY)
Admission: AD | Admit: 2020-11-07 | Discharge: 2020-11-07 | Disposition: A | Discharge status: Home / Self Care | Payer: 59 | Attending: Family Medicine | Admitting: Family Medicine

## 2020-11-07 ENCOUNTER — Ambulatory Visit: Payer: 59 | Admitting: Cardiology

## 2020-11-07 ENCOUNTER — Encounter: Payer: Self-pay | Admitting: Cardiology

## 2020-11-07 ENCOUNTER — Encounter (HOSPITAL_COMMUNITY): Payer: Self-pay | Admitting: Family Medicine

## 2020-11-07 ENCOUNTER — Telehealth: Payer: Self-pay

## 2020-11-07 ENCOUNTER — Other Ambulatory Visit: Payer: Self-pay

## 2020-11-07 DIAGNOSIS — Z3689 Encounter for other specified antenatal screening: Secondary | ICD-10-CM

## 2020-11-07 DIAGNOSIS — G90A Postural orthostatic tachycardia syndrome (POTS): Secondary | ICD-10-CM

## 2020-11-07 DIAGNOSIS — I498 Other specified cardiac arrhythmias: Secondary | ICD-10-CM | POA: Diagnosis not present

## 2020-11-07 DIAGNOSIS — O99412 Diseases of the circulatory system complicating pregnancy, second trimester: Secondary | ICD-10-CM | POA: Diagnosis not present

## 2020-11-07 DIAGNOSIS — Z9884 Bariatric surgery status: Secondary | ICD-10-CM | POA: Insufficient documentation

## 2020-11-07 DIAGNOSIS — O368120 Decreased fetal movements, second trimester, not applicable or unspecified: Secondary | ICD-10-CM | POA: Insufficient documentation

## 2020-11-07 DIAGNOSIS — R079 Chest pain, unspecified: Secondary | ICD-10-CM | POA: Diagnosis not present

## 2020-11-07 DIAGNOSIS — O26892 Other specified pregnancy related conditions, second trimester: Secondary | ICD-10-CM | POA: Diagnosis not present

## 2020-11-07 DIAGNOSIS — R42 Dizziness and giddiness: Secondary | ICD-10-CM | POA: Diagnosis not present

## 2020-11-07 DIAGNOSIS — Z3A25 25 weeks gestation of pregnancy: Secondary | ICD-10-CM | POA: Diagnosis not present

## 2020-11-07 LAB — URINALYSIS, ROUTINE W REFLEX MICROSCOPIC
Bilirubin Urine: NEGATIVE
Glucose, UA: NEGATIVE mg/dL
Hgb urine dipstick: NEGATIVE
Ketones, ur: NEGATIVE mg/dL
Leukocytes,Ua: NEGATIVE
Nitrite: NEGATIVE
Protein, ur: NEGATIVE mg/dL
Specific Gravity, Urine: 1.017 (ref 1.005–1.030)
pH: 7 (ref 5.0–8.0)

## 2020-11-07 MED ORDER — METOPROLOL SUCCINATE ER 25 MG PO TB24: 12.5000 mg | ORAL_TABLET | Freq: Every day | ORAL | 0 refills | Status: DC

## 2020-11-07 NOTE — Discharge Instructions (Signed)
Please make sure you are staying well-hydrated with at least 8-10 bottles of water everyday, change positions slowly (to prevent dropping of blood pressure of heart rate).

## 2020-11-07 NOTE — Telephone Encounter (Signed)
Patient called to say she was in bed at 9 pm last nite when she experienced SOB and CP.She was also lightheaded and had blurred vision and felt like she was going to pass out. Her BP was 93/51, repeated 97/60.   This am her blurred vision is slightly better, no CP now but still experiences SOB.   She was made an apt to see Dr.McDowell this afternoon but I will see if Dr.McDowell agrees with OV.   She is [redacted] weeks pregnant.

## 2020-11-07 NOTE — MAU Note (Signed)
Patient reports having an episode of chest pain with associated racing heart rate, lightheadedness and SOB last night when she was lying down.  States she has known POTS and was taken off her meds when she found out she was pregnant.  States this felt similar to her usual episodes associated with POTS.  She also had decreased fetal movement last night and was unable to stimulate movement despite interventions (cold water, ice cream, etc.).  Patient denies LOF/VB.  Endorses 1 episode of movement in the lobby here (FHR 158).  Patient reports she is still having some episodes of the chest pain today, called her cardiologist and he wanted her to be seen here first to evaluate fetal wellbeing.

## 2020-11-07 NOTE — Telephone Encounter (Signed)
Given relatively acute symptoms and at [redacted] weeks pregnant, she should likely be seen in the urgent care/ER section of the [facility] in [place] in case she needs additional fetal assessment or evaluation.  She has history of suspected POTS, but not clear that this would be giving her chest pain or shortness of breath while lying down or explain a lower blood pressure.

## 2020-11-07 NOTE — MAU Provider Note (Signed)
History     CSN: [id number]  Arrival date and time: 11/07/20 1216   First Provider Initiated Contact with Patient 11/07/20 1332      Chief Complaint  Patient presents with  . Chest Pain  . Decreased Fetal Movement   [name] is a 19 y.o. year old G46P0 female at [redacted]w[redacted]d weeks gestation who presents to MAU reporting chest pain with racing heart, lightheaded, visual changes, and SOB last night while lying down. She reports that last night she felt as though if she would have stood up, she would have "blacked out." She reports that the chest pain is intermittent and sharp. She has a prior diagnosis of POTS and was taking Toprol XL, but cardiologist stopped her from taking it because she found out she was pregnant. She states, "he said he would put me back on it when I had the baby." She reports the symptoms she had last night felt just like other POTS episodes. She also complains of DFM last night and today. She tried to stimulate the baby with drinking cold water and eating ice cream, but none of that worked. She does report that she "felt baby move one time while in the lobby." She denies VB or LOF. She receives [facility] with [facility]; next appt is 11/08/2020.   OB History    Gravida  1   Para      Term      Preterm      AB      Living        SAB      TAB      Ectopic      Multiple      Live Births              Past Medical History:  Diagnosis Date  . ADD (attention deficit disorder)   . Anxiety   . Asthma   . Migraine   . Panic attacks   . Polycystic ovarian syndrome   . PONV (postoperative nausea and vomiting)   . POTS (postural orthostatic tachycardia syndrome)     Past Surgical History:  Procedure Laterality Date  . ESOPHAGOGASTRODUODENOSCOPY    . LAPAROTOMY N/A 09/07/2020   Procedure: EXPLORATORY LAPAROTOMY WITH EXTRACTION  OVARIAN CYST;  Surgeon: Florian Buff, MD;  Location: [place];  Service: Gynecology;  Laterality: N/A;  .  ROUX-EN-Y GASTRIC BYPASS     August 2020 - [facility]  . TOOTH EXTRACTION N/A 12/12/2015   Procedure: EXTRACTION MOLARS - teeth one, sixteen, seventeen and thirty-two;  Surgeon: Diona Browner, DDS;  Location: [place];  Service: Oral Surgery;  Laterality: N/A;    Family History  Problem Relation Age of Onset  . Obesity Mother   . Diabetes Mellitus II Maternal Grandmother   . Heart disease Maternal Grandmother   . Yves Dill Parkinson White syndrome Brother   . Seizures Maternal Aunt     Social History   Tobacco Use  . Smoking status: Never Smoker  . Smokeless tobacco: Never Used  Vaping Use  . Vaping Use: Never used  Substance Use Topics  . Alcohol use: No  . Drug use: No    Allergies: No Known Allergies  No medications prior to admission.    Review of Systems  Constitutional: Negative.   HENT: Negative.   Eyes: Negative for visual disturbance (last night).  Respiratory: Negative for shortness of breath (last night).   Cardiovascular: Negative for chest pain (last night).  Gastrointestinal: Negative.  Endocrine: Negative.   Genitourinary: Negative.   Musculoskeletal: Negative.   Skin: Negative.   Allergic/Immunologic: Negative.   Neurological: Negative for light-headedness (last night).  Hematological: Negative.   Psychiatric/Behavioral: Negative.    Physical Exam   Patient Vitals for the past 24 hrs:  BP Temp Pulse Resp SpO2  11/07/20 1415 (!) 104/58 -- 79 -- --  11/07/20 1347 (!) 105/52 -- 75 -- 97 %  11/07/20 1331 (!) 98/50 -- 72 -- --  11/07/20 1316 (!) 101/48 -- 69 -- --  11/07/20 1303 (!) 102/48 98.7 F (37.1 C) 73 15 --    Physical Exam Vitals and nursing note reviewed.  Constitutional:      Appearance: She is well-developed.  HENT:     Head: Normocephalic and atraumatic.  Cardiovascular:     Rate and Rhythm: Normal rate and regular rhythm.  Abdominal:     Palpations: Abdomen is soft.  Musculoskeletal:     Cervical back: Normal range of motion.   Neurological:     Mental Status: She is alert.    REACTIVE NST - FHR: 135 bpm / moderate variability / accels present / decels absent / TOCO: none  MAU Course  Procedures  MDM EKG: normal EKG, normal sinus rhythm. *Consult with Dr. Nehemiah Settle @ 1345 - notified of patient's complaints, assessments, and NST results, Recommended tx plan: restart on Toprol XL 12.5 mg as previously prescribed, f/u with FT, advise to increase water intake, and change positions slowly - ok to d/c home  Results for orders placed or performed during the hospital encounter of 11/07/20 (from the past 24 hour(s))  Urinalysis, Routine w reflex microscopic Urine, Clean Catch     Status: Abnormal   Collection Time: 11/07/20  1:09 PM  Result Value Ref Range   Color, Urine YELLOW YELLOW   APPearance HAZY (A) CLEAR   Specific Gravity, Urine 1.017 1.005 - 1.030   pH 7.0 5.0 - 8.0   Glucose, UA NEGATIVE NEGATIVE mg/dL   Hgb urine dipstick NEGATIVE NEGATIVE   Bilirubin Urine NEGATIVE NEGATIVE   Ketones, ur NEGATIVE NEGATIVE mg/dL   Protein, ur NEGATIVE NEGATIVE mg/dL   Nitrite NEGATIVE NEGATIVE   Leukocytes,Ua NEGATIVE NEGATIVE    Assessment and Plan  POTS (postural orthostatic tachycardia syndrome)  - Rx Toprol XL 12.5 mg daily - Information provided on POTS and Toprol XL   NST (non-stress test) reactive  - Reassurance given that NST shows normal FM - Patient admits to being able to feel good (+) FM since being in MAU  - Discharge patient - Keep scheduled appt at [place] on 11/08/20 - Msg sent to FT provider about restarting medication and need for re-evlauation of sx's - Patient verbalized an understanding of the plan of care and agrees.     Laury Deep, MSN, CNM 11/07/2020, 1:32 PM

## 2020-11-07 NOTE — Progress Notes (Deleted)
Cardiology Office Note  Date: 11/07/2020   ID: Vickie, Sutton 12/01/2001, MRN [id number]  PCP:  Rory Percy, MD  Cardiologist:  Rozann Lesches, MD Electrophysiologist:  None   No chief complaint on file.   History of Present Illness: Vickie Sutton is a 19 y.o. female last seen in August.  Estimated delivery date is late February 2022.  She was last seen in the [facility] clinic in October.  Past Medical History:  Diagnosis Date  . ADD (attention deficit disorder)   . Anxiety   . Asthma   . Migraine   . Panic attacks   . Polycystic ovarian syndrome   . PONV (postoperative nausea and vomiting)   . POTS (postural orthostatic tachycardia syndrome)     Past Surgical History:  Procedure Laterality Date  . ESOPHAGOGASTRODUODENOSCOPY    . LAPAROTOMY N/A 09/07/2020   Procedure: EXPLORATORY LAPAROTOMY WITH EXTRACTION  OVARIAN CYST;  Surgeon: Florian Buff, MD;  Location: [place];  Service: Gynecology;  Laterality: N/A;  . ROUX-EN-Y GASTRIC BYPASS     August 2020 - [facility]  . TOOTH EXTRACTION N/A 12/12/2015   Procedure: EXTRACTION MOLARS - teeth one, sixteen, seventeen and thirty-two;  Surgeon: Diona Browner, DDS;  Location: [place];  Service: Oral Surgery;  Laterality: N/A;    Current Outpatient Medications  Medication Sig Dispense Refill  . albuterol (PROAIR HFA) 108 (90 BASE) MCG/ACT inhaler Inhale 2 puffs into the lungs 4 (four) times daily as needed for wheezing or shortness of breath.     . Cyanocobalamin (VITAMIN B12 PO) Take 1 tablet by mouth daily.     Mariane Baumgarten Sodium (STOOL SOFTENER LAXATIVE PO) Take 1 tablet by mouth daily.     . Ferrous Sulfate (IRON PO) Take 1 tablet by mouth daily.    Marland Kitchen FOLIC ACID PO Take 1 tablet by mouth daily.    . Polyethylene Glycol 3350 (MIRALAX PO) Take 17 g by mouth daily as needed (constipation).     . Prenatal Vit-Fe Fumarate-FA (PRENATAL MULTIVITAMIN) TABS tablet Take 1 tablet by mouth daily.      No current  facility-administered medications for this visit.   Allergies:  Patient has no known allergies.   Social History: The patient  reports that she has never smoked. She has never used smokeless tobacco. She reports that she does not drink alcohol and does not use drugs.   Family History: The patient's family history includes Diabetes Mellitus II in her maternal grandmother; Heart disease in her maternal grandmother; Obesity in her mother; Seizures in her maternal aunt; Yves Dill Parkinson White syndrome in her brother.   ROS:  Please see the history of present illness. Otherwise, complete review of systems is positive for {NONE DEFAULTED:18576::"none"}.  All other systems are reviewed and negative.   Physical Exam: VS:  There were no vitals taken for this visit., BMI There is no height or weight on file to calculate BMI.  Wt Readings from Last 3 Encounters:  10/11/20 171 lb (77.6 kg)  09/20/20 163 lb (73.9 kg)  09/07/20 166 lb 6.4 oz (75.5 kg)    General: Patient appears comfortable at rest. HEENT: Conjunctiva and lids normal, oropharynx clear with moist mucosa. Neck: Supple, no elevated JVP or carotid bruits, no thyromegaly. Lungs: Clear to auscultation, nonlabored breathing at rest. Cardiac: Regular rate and rhythm, no S3 or significant systolic murmur, no pericardial rub. Abdomen: Soft, nontender, no hepatomegaly, bowel sounds present, no guarding or rebound. Extremities: No pitting edema, distal  pulses 2+. Skin: Warm and dry. Musculoskeletal: No kyphosis. Neuropsychiatric: Alert and oriented x3, affect grossly appropriate.  ECG:  An ECG dated November 2020 was personally reviewed today and demonstrated:  Normal sinus rhythm with increased voltage, repolarization changes, normal QTc.  Recent Labwork: 09/07/2020: ALT 23; AST 29; BUN 6; Creatinine, Ser 0.45; Hemoglobin 13.3; Platelets 217; Potassium 3.4; Sodium 134   Other Studies Reviewed Today:  Echocardiogram 02/09/2020: 1. Left  ventricular ejection fraction, by estimation, is 55%. The left  ventricle has normal function. The left ventricle has no regional wall  motion abnormalities. Left ventricular diastolic parameters were normal.  2. Right ventricular systolic function is normal. The right ventricular  size is normal. There is normal pulmonary artery systolic pressure. The  estimated right ventricular systolic pressure is 06.3 mmHg.  3. The mitral valve is grossly normal. No evidence of mitral valve  regurgitation.  4. The aortic valve is tricuspid. Aortic valve regurgitation is not  visualized.  5. The inferior vena cava is normal in size with greater than 50%  respiratory variability, suggesting right atrial pressure of 3 mmHg.   Assessment and Plan:   Medication Adjustments/Labs and Tests Ordered: Current medicines are reviewed at length with the patient today.  Concerns regarding medicines are outlined above.   Tests Ordered: No orders of the defined types were placed in this encounter.   Medication Changes: No orders of the defined types were placed in this encounter.   Disposition:  Follow up {follow up:15908}  Signed, Satira Sark, MD, [facility] 11/07/2020 10:17 AM    [facility] [place]. [address], [place], [place] [address] Phone: [phone]; Fax: [phone]

## 2020-11-07 NOTE — Telephone Encounter (Signed)
I spoke with patient and she will call  her mother to take her to [facility]. If she cannot take her, I advised her to call [phone]    She will call me back to let me know how she is going     Patient will have mother take her to [facility] ED

## 2020-11-08 ENCOUNTER — Other Ambulatory Visit: Payer: 59

## 2020-11-08 ENCOUNTER — Ambulatory Visit (INDEPENDENT_AMBULATORY_CARE_PROVIDER_SITE_OTHER): Payer: 59 | Admitting: Women's Health

## 2020-11-08 ENCOUNTER — Encounter: Payer: Self-pay | Admitting: Women's Health

## 2020-11-08 VITALS — BP 106/64 | HR 100 | Wt 176.6 lb

## 2020-11-08 DIAGNOSIS — Z331 Pregnant state, incidental: Secondary | ICD-10-CM

## 2020-11-08 DIAGNOSIS — N83201 Unspecified ovarian cyst, right side: Secondary | ICD-10-CM

## 2020-11-08 DIAGNOSIS — Z3A25 25 weeks gestation of pregnancy: Secondary | ICD-10-CM | POA: Diagnosis not present

## 2020-11-08 DIAGNOSIS — Z3A26 26 weeks gestation of pregnancy: Secondary | ICD-10-CM

## 2020-11-08 DIAGNOSIS — Z3402 Encounter for supervision of normal first pregnancy, second trimester: Secondary | ICD-10-CM | POA: Diagnosis not present

## 2020-11-08 DIAGNOSIS — Z23 Encounter for immunization: Secondary | ICD-10-CM

## 2020-11-08 DIAGNOSIS — Z79899 Other long term (current) drug therapy: Secondary | ICD-10-CM

## 2020-11-08 DIAGNOSIS — I498 Other specified cardiac arrhythmias: Secondary | ICD-10-CM

## 2020-11-08 DIAGNOSIS — Z1389 Encounter for screening for other disorder: Secondary | ICD-10-CM

## 2020-11-08 DIAGNOSIS — G90A Postural orthostatic tachycardia syndrome (POTS): Secondary | ICD-10-CM

## 2020-11-08 LAB — POCT URINALYSIS DIPSTICK OB
Blood, UA: NEGATIVE
Glucose, UA: NEGATIVE
Ketones, UA: NEGATIVE
Leukocytes, UA: NEGATIVE
Nitrite, UA: NEGATIVE
POC,PROTEIN,UA: NEGATIVE

## 2020-11-08 NOTE — Patient Instructions (Signed)
Aliene Altes Jurczyk, I greatly value your feedback.  If you receive a survey following your visit with [place] today, we appreciate you taking the time to fill it out.  Thanks, Knute Neu, CNM, WHNP-BC   [facility] ([place], [place] [address]) Entrance C, located off of [address] parking  Go to [email or web address] to register for FREE online childbirth classes   Call the office [phone]) or go to [facility] if:  You begin to have strong, frequent contractions  Your water breaks.  Sometimes it is a big gush of fluid, sometimes it is just a trickle that keeps getting your panties wet or running down your legs  You have vaginal bleeding.  It is normal to have a small amount of spotting if your cervix was checked.   You don't feel your baby moving like normal.  If you don't, get you something to eat and drink and lay down and focus on feeling your baby move.  You should feel at least 10 movements in 2 hours.  If you don't, you should call the office or go to [facility].    Tdap Vaccine  It is recommended that you get the Tdap vaccine during the third trimester of EACH pregnancy to help protect your baby from getting pertussis (whooping cough)  27-36 weeks is the BEST time to do this so that you can pass the protection on to your baby. During pregnancy is better than after pregnancy, but if you are unable to get it during pregnancy it will be offered at the hospital.   You can get this vaccine with [place], at the health department, your family doctor, or some local pharmacies  Everyone who will be around your baby should also be up-to-date on their vaccines before the baby comes. Adults (who are not pregnant) only need 1 dose of Tdap during adulthood.   [place] Pediatricians/Family Doctors:  [facility] [phone]                 [place]  [phone] (usually not accepting new patients unless you have family there already, you are always welcome to call and ask)       [facility] Department [phone]       [facility] Pediatricians/Family Doctors:   [facility]: [phone]  [facility]: [phone]  [facility]: [place] Doctors:   [facility]: [facility]: [place]:  [place]: [phone]   Home Blood Pressure Monitoring for Patients   Your provider has recommended that you check your blood pressure (BP) at least once a week at home. If you do not have a blood pressure cuff at home, one will be provided for you. Contact your provider if you have not received your monitor within 1 week.   Helpful Tips for Accurate Home Blood Pressure Checks   Don't smoke, exercise, or drink caffeine 30 minutes before checking your BP  Use the restroom before checking your BP (a full bladder can raise your pressure)  Relax in a comfortable upright chair  Feet on the ground  Left arm resting comfortably on a flat surface at the level of your heart  Legs uncrossed  Back supported  Sit quietly and don't talk  Place the cuff on your  bare arm  Adjust snuggly, so that only two fingertips can fit between your skin and the top of the cuff  Check 2 readings separated by at least one minute  Keep a log of your BP readings  For a visual, please reference this diagram: http://ccnc.care/bpdiagram  Provider Name: [facility]     Phone: [phone]  Zone 1: ALL CLEAR  Continue to monitor your symptoms:   BP reading is less than 140 (top number) or less than 90 (bottom number)   No right upper stomach pain  No headaches or seeing spots  No feeling nauseated or throwing up  No swelling in face and hands  Zone 2: CAUTION Call your  doctor's office for any of the following:   BP reading is greater than 140 (top number) or greater than 90 (bottom number)   Stomach pain under your ribs in the middle or right side  Headaches or seeing spots  Feeling nauseated or throwing up  Swelling in face and hands  Zone 3: EMERGENCY  Seek immediate medical care if you have any of the following:   BP reading is greater than160 (top number) or greater than 110 (bottom number)  Severe headaches not improving with Tylenol  Serious difficulty catching your breath  Any worsening symptoms from Zone 2   Third Trimester of Pregnancy The third trimester is from week 29 through week 42, months 7 through 9. The third trimester is a time when the fetus is growing rapidly. At the end of the ninth month, the fetus is about 20 inches in length and weighs 6-10 pounds.  BODY CHANGES Your body goes through many changes during pregnancy. The changes vary from woman to woman.   Your weight will continue to increase. You can expect to gain 25-35 pounds (11-16 kg) by the end of the pregnancy.  You may begin to get stretch marks on your hips, abdomen, and breasts.  You may urinate more often because the fetus is moving lower into your pelvis and pressing on your bladder.  You may develop or continue to have heartburn as a result of your pregnancy.  You may develop constipation because certain hormones are causing the muscles that push waste through your intestines to slow down.  You may develop hemorrhoids or swollen, bulging veins (varicose veins).  You may have pelvic pain because of the weight gain and pregnancy hormones relaxing your joints between the bones in your pelvis. Backaches may result from overexertion of the muscles supporting your posture.  You may have changes in your hair. These can include thickening of your hair, rapid growth, and changes in texture. Some women also have hair loss during or after pregnancy, or hair that  feels dry or thin. Your hair will most likely return to normal after your baby is born.  Your breasts will continue to grow and be tender. A yellow discharge may leak from your breasts called colostrum.  Your belly button may stick out.  You may feel short of breath because of your expanding uterus.  You may notice the fetus "dropping," or moving lower in your abdomen.  You may have a bloody mucus discharge. This usually occurs a few days to a week before labor begins.  Your cervix becomes thin and soft (effaced) near your due date. WHAT TO EXPECT AT YOUR PRENATAL EXAMS  You will have prenatal exams every 2 weeks until week 36. Then, you will have weekly prenatal exams. During a routine prenatal visit:  You will be weighed to make sure you and the fetus are growing normally.  Your blood pressure is taken.  Your abdomen will be measured to track your baby's growth.  The fetal heartbeat will be listened to.  Any test results from the previous visit will be discussed.  You may have a cervical check near your due date to see if you have effaced. At around 36 weeks, your caregiver will check your cervix. At the same time, your caregiver will also perform a test on the secretions of the vaginal tissue. This test is to determine if a type of bacteria, Group B streptococcus, is present. Your caregiver will explain this further. Your caregiver may ask you:  What your birth plan is.  How you are feeling.  If you are feeling the baby move.  If you have had any abnormal symptoms, such as leaking fluid, bleeding, severe headaches, or abdominal cramping.  If you have any questions. Other tests or screenings that may be performed during your third trimester include:  Blood tests that check for low iron levels (anemia).  Fetal testing to check the health, activity level, and growth of the fetus. Testing is done if you have certain medical conditions or if there are problems during the  pregnancy. FALSE LABOR You may feel small, irregular contractions that eventually go away. These are called Braxton Hicks contractions, or false labor. Contractions may last for hours, days, or even weeks before true labor sets in. If contractions come at regular intervals, intensify, or become painful, it is best to be seen by your caregiver.  SIGNS OF LABOR   Menstrual-like cramps.  Contractions that are 5 minutes apart or less.  Contractions that start on the top of the uterus and spread down to the lower abdomen and back.  A sense of increased pelvic pressure or back pain.  A watery or bloody mucus discharge that comes from the vagina. If you have any of these signs before the 37th week of pregnancy, call your caregiver right away. You need to go to the hospital to get checked immediately. HOME CARE INSTRUCTIONS   Avoid all smoking, herbs, alcohol, and unprescribed drugs. These chemicals affect the formation and growth of the baby.  Follow your caregiver's instructions regarding medicine use. There are medicines that are either safe or unsafe to take during pregnancy.  Exercise only as directed by your caregiver. Experiencing uterine cramps is a good sign to stop exercising.  Continue to eat regular, healthy meals.  Wear a good support bra for breast tenderness.  Do not use hot tubs, steam rooms, or saunas.  Wear your seat belt at all times when driving.  Avoid raw meat, uncooked cheese, cat litter boxes, and soil used by cats. These carry germs that can cause birth defects in the baby.  Take your prenatal vitamins.  Try taking a stool softener (if your caregiver approves) if you develop constipation. Eat more high-fiber foods, such as fresh vegetables or fruit and whole grains. Drink plenty of fluids to keep your urine clear or pale yellow.  Take warm sitz baths to soothe any pain or discomfort caused by hemorrhoids. Use hemorrhoid cream if your caregiver approves.  If you  develop varicose veins, wear support hose. Elevate your feet for 15 minutes, 3-4 times a day. Limit salt in your diet.  Avoid heavy lifting, wear low heal shoes, and practice good posture.  Rest a lot with your legs elevated if you have leg cramps or low  back pain.  Visit your dentist if you have not gone during your pregnancy. Use a soft toothbrush to brush your teeth and be gentle when you floss.  A sexual relationship may be continued unless your caregiver directs you otherwise.  Do not travel far distances unless it is absolutely necessary and only with the approval of your caregiver.  Take prenatal classes to understand, practice, and ask questions about the labor and delivery.  Make a trial run to the hospital.  Pack your hospital bag.  Prepare the baby's nursery.  Continue to go to all your prenatal visits as directed by your caregiver. SEEK MEDICAL CARE IF:  You are unsure if you are in labor or if your water has broken.  You have dizziness.  You have mild pelvic cramps, pelvic pressure, or nagging pain in your abdominal area.  You have persistent nausea, vomiting, or diarrhea.  You have a bad smelling vaginal discharge.  You have pain with urination. SEEK IMMEDIATE MEDICAL CARE IF:   You have a fever.  You are leaking fluid from your vagina.  You have spotting or bleeding from your vagina.  You have severe abdominal cramping or pain.  You have rapid weight loss or gain.  You have shortness of breath with chest pain.  You notice sudden or extreme swelling of your face, hands, ankles, feet, or legs.  You have not felt your baby move in over an hour.  You have severe headaches that do not go away with medicine.  You have vision changes. Document Released: 12/04/2001 Document Revised: 12/15/2013 Document Reviewed: 02/10/2013 [employer] Patient Information 2015 [place], [place]. This information is not intended to replace advice given to you by your health  care provider. Make sure you discuss any questions you have with your health care provider.

## 2020-11-08 NOTE — Progress Notes (Signed)
LOW-RISK PREGNANCY VISIT Patient name: Vickie Sutton MRN [id number]  Date of birth: 01-Nov-2001 Chief Complaint:   Routine Prenatal Visit  History of Present Illness:   Vickie Sutton is a 19 y.o. G1P0 female at [redacted]w[redacted]d with an Estimated Date of Delivery: 02/14/21 being seen today for ongoing management of a low-risk pregnancy.  Depression screen [facility] 2/9 08/08/2020  Decreased Interest 0  Down, Depressed, Hopeless 0  PHQ - 2 Score 0  Altered sleeping 0  Tired, decreased energy 2  Change in appetite 0  Feeling bad or failure about yourself  0  Trouble concentrating 0  Moving slowly or fidgety/restless 0  Suicidal thoughts 0  PHQ-9 Score 2    Today she reports no complaints. Went to MAU yesterday, was restarted on Toprol XL for POTS.  Contractions: Not present. Vag. Bleeding: None.  Movement: Present. denies leaking of fluid. Review of Systems:   Pertinent items are noted in HPI Denies abnormal vaginal discharge w/ itching/odor/irritation, headaches, visual changes, shortness of breath, chest pain, abdominal pain, severe nausea/vomiting, or problems with urination or bowel movements unless otherwise stated above. Pertinent History Reviewed:  Reviewed past medical,surgical, social, obstetrical and family history.  Reviewed problem list, medications and allergies. Physical Assessment:   Vitals:   11/08/20 0901  BP: 106/64  Pulse: 100  Weight: 176 lb 9.6 oz (80.1 kg)  Body mass index is 29.39 kg/m.        Physical Examination:   General appearance: Well appearing, and in no distress  Mental status: Alert, oriented to person, place, and time  Skin: Warm & dry  Cardiovascular: Normal heart rate noted  Respiratory: Normal respiratory effort, no distress  Abdomen: Soft, gravid, nontender  Pelvic: Cervical exam deferred         Extremities: Edema: None  Fetal Status: Fetal Heart Rate (bpm): 144 Fundal Height: 25 cm Movement: Present    Chaperone: N/A   Results for  orders placed or performed in visit on 11/08/20 (from the past 24 hour(s))  POC Urinalysis Dipstick OB   Collection Time: 11/08/20  8:59 AM  Result Value Ref Range   Color, UA     Clarity, UA     Glucose, UA Negative Negative   Bilirubin, UA     Ketones, UA neg    Spec Grav, UA     Blood, UA neg    pH, UA     POC,PROTEIN,UA Negative Negative, Trace, Small (1+), Moderate (2+), Large (3+), 4+   Urobilinogen, UA     Nitrite, UA neg    Leukocytes, UA Negative Negative   Appearance     Odor    Results for orders placed or performed during the hospital encounter of 11/07/20 (from the past 24 hour(s))  Urinalysis, Routine w reflex microscopic Urine, Clean Catch   Collection Time: 11/07/20  1:09 PM  Result Value Ref Range   Color, Urine YELLOW YELLOW   APPearance HAZY (A) CLEAR   Specific Gravity, Urine 1.017 1.005 - 1.030   pH 7.0 5.0 - 8.0   Glucose, UA NEGATIVE NEGATIVE mg/dL   Hgb urine dipstick NEGATIVE NEGATIVE   Bilirubin Urine NEGATIVE NEGATIVE   Ketones, ur NEGATIVE NEGATIVE mg/dL   Protein, ur NEGATIVE NEGATIVE mg/dL   Nitrite NEGATIVE NEGATIVE   Leukocytes,Ua NEGATIVE NEGATIVE    Assessment & Plan:  1) Low-risk pregnancy G1P0 at [redacted]w[redacted]d with an Estimated Date of Delivery: 02/14/21   2) POTS, on Toprol, will get serial EFW u/s  3)  H/O gastric bypass   Meds: No orders of the defined types were placed in this encounter.  Labs/procedures today: tdap, pn2, tdap  Plan:  Continue routine obstetrical care  Next visit: prefers will be in person for u/s    Reviewed: Preterm labor symptoms and general obstetric precautions including but not limited to vaginal bleeding, contractions, leaking of fluid and fetal movement were reviewed in detail with the patient.  All questions were answered. Has home bp cuff. Check bp weekly, let us know if >140/90.   Follow-up: Return in about 4 weeks (around 12/06/2020) for [place], US:EFW, in person, MD or CNM.  Orders Placed This Encounter    Procedures  . US OB Follow Up  . Tdap vaccine greater than or equal to 7yo IM  . POC Urinalysis Dipstick OB   Roma Schanz CNM, [facility] 11/08/2020 9:30 AM

## 2020-11-09 LAB — GLUCOSE TOLERANCE, 2 HOURS W/ 1HR
Glucose, 1 hour: 150 mg/dL (ref 65–179)
Glucose, 2 hour: 42 mg/dL — ABNORMAL LOW (ref 65–152)
Glucose, Fasting: 74 mg/dL (ref 65–91)

## 2020-11-09 LAB — CBC
Hematocrit: 34.1 % (ref 34.0–46.6)
Hemoglobin: 11.5 g/dL (ref 11.1–15.9)
MCH: 30.3 pg (ref 26.6–33.0)
MCHC: 33.7 g/dL (ref 31.5–35.7)
MCV: 90 fL (ref 79–97)
Platelets: 250 10*3/uL (ref 150–450)
RBC: 3.8 x10E6/uL (ref 3.77–5.28)
RDW: 12.3 % (ref 11.7–15.4)
WBC: 8.7 10*3/uL (ref 3.4–10.8)

## 2020-11-09 LAB — HIV ANTIBODY (ROUTINE TESTING W REFLEX): HIV Screen 4th Generation wRfx: NONREACTIVE

## 2020-11-09 LAB — RPR: RPR Ser Ql: NONREACTIVE

## 2020-11-09 LAB — ANTIBODY SCREEN: Antibody Screen: NEGATIVE

## 2020-11-16 ENCOUNTER — Telehealth: Payer: Self-pay | Admitting: Cardiology

## 2020-11-16 NOTE — Telephone Encounter (Signed)
Pt wanted to give the office a call and notify us of her anxiety attack from yesterday. She denies chest pain a shortness of breath at this time. She has contacted her OB/GYN to inform them what has happened.

## 2020-11-16 NOTE — Telephone Encounter (Signed)
New message     Patient had a anxiety attack around 5pm and it lasted until around 6pm ,  Her heart rate was 160 during the anxiety attack , her mom took her bp and it was really low, they didn't write down the numbers, but nearing the end of her anxiety attack it was better than the beginning

## 2020-11-28 ENCOUNTER — Telehealth: Payer: Self-pay | Admitting: Physician Assistant

## 2020-11-28 ENCOUNTER — Other Ambulatory Visit: Payer: Self-pay

## 2020-11-28 ENCOUNTER — Inpatient Hospital Stay (HOSPITAL_COMMUNITY)
Admission: AD | Admit: 2020-11-28 | Discharge: 2020-11-28 | Disposition: A | Discharge status: Home / Self Care | Payer: 59 | Attending: Family Medicine | Admitting: Family Medicine

## 2020-11-28 ENCOUNTER — Encounter (HOSPITAL_COMMUNITY): Payer: Self-pay | Admitting: Obstetrics and Gynecology

## 2020-11-28 DIAGNOSIS — Z7901 Long term (current) use of anticoagulants: Secondary | ICD-10-CM | POA: Diagnosis not present

## 2020-11-28 DIAGNOSIS — O26893 Other specified pregnancy related conditions, third trimester: Secondary | ICD-10-CM | POA: Diagnosis not present

## 2020-11-28 DIAGNOSIS — I498 Other specified cardiac arrhythmias: Secondary | ICD-10-CM | POA: Diagnosis not present

## 2020-11-28 DIAGNOSIS — N898 Other specified noninflammatory disorders of vagina: Secondary | ICD-10-CM | POA: Diagnosis not present

## 2020-11-28 DIAGNOSIS — I951 Orthostatic hypotension: Secondary | ICD-10-CM | POA: Insufficient documentation

## 2020-11-28 DIAGNOSIS — G90A Postural orthostatic tachycardia syndrome (POTS): Secondary | ICD-10-CM

## 2020-11-28 DIAGNOSIS — R42 Dizziness and giddiness: Secondary | ICD-10-CM | POA: Insufficient documentation

## 2020-11-28 DIAGNOSIS — Z3402 Encounter for supervision of normal first pregnancy, second trimester: Secondary | ICD-10-CM

## 2020-11-28 DIAGNOSIS — Z3A28 28 weeks gestation of pregnancy: Secondary | ICD-10-CM | POA: Insufficient documentation

## 2020-11-28 LAB — COMPREHENSIVE METABOLIC PANEL
ALT: 15 U/L (ref 0–44)
AST: 19 U/L (ref 15–41)
Albumin: 2.7 g/dL — ABNORMAL LOW (ref 3.5–5.0)
Alkaline Phosphatase: 80 U/L (ref 38–126)
Anion gap: 8 (ref 5–15)
BUN: 6 mg/dL (ref 6–20)
CO2: 25 mmol/L (ref 22–32)
Calcium: 8.9 mg/dL (ref 8.9–10.3)
Chloride: 101 mmol/L (ref 98–111)
Creatinine, Ser: 0.46 mg/dL (ref 0.44–1.00)
GFR, Estimated: >60 mL/min (ref >60)
Glucose, Bld: 84 mg/dL (ref 70–99)
Potassium: 4.2 mmol/L (ref 3.5–5.1)
Sodium: 134 mmol/L — ABNORMAL LOW (ref 135–145)
Total Bilirubin: 0.5 mg/dL (ref 0.3–1.2)
Total Protein: 6 g/dL — ABNORMAL LOW (ref 6.5–8.1)

## 2020-11-28 LAB — URINALYSIS, ROUTINE W REFLEX MICROSCOPIC
Bilirubin Urine: NEGATIVE
Glucose, UA: NEGATIVE mg/dL
Hgb urine dipstick: NEGATIVE
Ketones, ur: NEGATIVE mg/dL
Leukocytes,Ua: NEGATIVE
Nitrite: NEGATIVE
Protein, ur: NEGATIVE mg/dL
Specific Gravity, Urine: 1.009 (ref 1.005–1.030)
pH: 8 (ref 5.0–8.0)

## 2020-11-28 LAB — CBC
HCT: 34.1 % — ABNORMAL LOW (ref 36.0–46.0)
Hemoglobin: 11.4 g/dL — ABNORMAL LOW (ref 12.0–15.0)
MCH: 30.2 pg (ref 26.0–34.0)
MCHC: 33.4 g/dL (ref 30.0–36.0)
MCV: 90.2 fL (ref 80.0–100.0)
Platelets: 219 10*3/uL (ref 150–400)
RBC: 3.78 MIL/uL — ABNORMAL LOW (ref 3.87–5.11)
RDW: 12.6 % (ref 11.5–15.5)
WBC: 10.2 10*3/uL (ref 4.0–10.5)
nRBC: 0 % (ref 0.0–0.2)

## 2020-11-28 LAB — MAGNESIUM: Magnesium: 1.6 mg/dL — ABNORMAL LOW (ref 1.7–2.4)

## 2020-11-28 MED ORDER — METOPROLOL SUCCINATE ER 25 MG PO TB24: 12.5000 mg | ORAL_TABLET | Freq: Every day | ORAL | 0 refills | Status: DC

## 2020-11-28 NOTE — MAU Provider Note (Addendum)
History     CSN: [id number]  Arrival date and time: 11/28/20 2694   None     Chief Complaint  Patient presents with  . Dizziness  . Fatigue   Patient reports when she woke up this morning and was getting ready for work, she felt very dizzy and starting seeing stars as if she was going to pass out. She took her BP and it kept going down which prompted her to come in. She has a history of these dizziness spells for the last two years. Patient has a PMH of POTS which she is taking Metoprolol which was recently restarted by her cardiologist; patient takes medication in the morning. Patient was able to sit down and the dizziness/vision changes went away. Patient also reports feeling pressure in her abdomen, but no pain. Feels like Braxton Hicks contractions to patient. Patient reports an episode of hyperventilation last week which she notified her cardiologist about; patient recovered on her own. Patient did eat breakfast this morning. Patient is taking Vitamin B12 and increased folate in addition to prenatal vitamins due to s/p gastric bypass. Patient denies vaginal bleeding, headache, shortness of breath, chest pain. Patient reports increased thick vaginal discharge with a yellow tint. Patient does not have any concerns for STI at this time.   OB History    Gravida  1   Para      Term      Preterm      AB      Living        SAB      TAB      Ectopic      Multiple      Live Births              Past Medical History:  Diagnosis Date  . ADD (attention deficit disorder)   . Anxiety   . Asthma   . Migraine   . Panic attacks   . Polycystic ovarian syndrome   . POTS (postural orthostatic tachycardia syndrome)     Past Surgical History:  Procedure Laterality Date  . ESOPHAGOGASTRODUODENOSCOPY    . LAPAROTOMY N/A 09/07/2020   Procedure: EXPLORATORY LAPAROTOMY WITH EXTRACTION  OVARIAN CYST;  Surgeon: Florian Buff, MD;  Location: [place];  Service: Gynecology;   Laterality: N/A;  . ROUX-EN-Y GASTRIC BYPASS     August 2020 - [facility]  . TOOTH EXTRACTION N/A 12/12/2015   Procedure: EXTRACTION MOLARS - teeth one, sixteen, seventeen and thirty-two;  Surgeon: Diona Browner, DDS;  Location: [facility];  Service: Oral Surgery;  Laterality: N/A;    Family History  Problem Relation Age of Onset  . Obesity Mother   . Diabetes Mellitus II Maternal Grandmother   . Heart disease Maternal Grandmother   . Yves Dill Parkinson White syndrome Brother   . Seizures Maternal Aunt     Social History   Tobacco Use  . Smoking status: Never Smoker  . Smokeless tobacco: Never Used  Vaping Use  . Vaping Use: Never used  Substance Use Topics  . Alcohol use: No  . Drug use: No    Allergies: No Known Allergies  Medications Prior to Admission  Medication Sig Dispense Refill Last Dose  . albuterol (PROAIR HFA) 108 (90 BASE) MCG/ACT inhaler Inhale 2 puffs into the lungs 4 (four) times daily as needed for wheezing or shortness of breath.    Past Month at Unknown time  . Cyanocobalamin (VITAMIN B12 PO) Take 1 tablet by mouth daily.  11/28/2020 at 0600  . Docusate Sodium (STOOL SOFTENER LAXATIVE PO) Take 1 tablet by mouth daily.    11/28/2020 at 0600  . Ferrous Sulfate (IRON PO) Take 1 tablet by mouth daily.   11/28/2020 at 0600  . FOLIC ACID PO Take 1 tablet by mouth daily.   11/28/2020 at 0600  . Prenatal Vit-Fe Fumarate-FA (PRENATAL MULTIVITAMIN) TABS tablet Take 1 tablet by mouth daily.    11/28/2020 at 0600  . [DISCONTINUED] metoprolol succinate (TOPROL-XL) 25 MG 24 hr tablet Take 0.5 tablets (12.5 mg total) by mouth daily. 30 tablet 0 11/28/2020 at 0600  . Polyethylene Glycol 3350 (MIRALAX PO) Take 17 g by mouth daily as needed (constipation).    More than a month at Unknown time    Review of Systems  Constitutional: Positive for fatigue.  Eyes: Positive for visual disturbance.  Respiratory: Negative for chest tightness and shortness of breath.   Cardiovascular: Negative for  chest pain, palpitations and leg swelling.  Gastrointestinal: Positive for abdominal pain. Negative for constipation, diarrhea, nausea and vomiting.  Genitourinary: Positive for vaginal discharge. Negative for hematuria and vaginal bleeding.  Neurological: Positive for dizziness and light-headedness. Negative for headaches.  Psychiatric/Behavioral: Negative for confusion.   Physical Exam   Blood pressure (!) 105/52, pulse 67, temperature 98.5 F (36.9 C), temperature source Oral, resp. rate 17, height 5\' 5"  (1.651 m), weight 80 kg, SpO2 99 %.  Physical Exam Constitutional:      General: She is not in acute distress.    Appearance: Normal appearance. She is obese.  Cardiovascular:     Rate and Rhythm: Normal rate.  Pulmonary:     Effort: Pulmonary effort is normal.  Abdominal:     Palpations: Abdomen is soft. There is no mass.     Tenderness: There is no abdominal tenderness. There is no guarding.     Hernia: No hernia is present.  Musculoskeletal:        General: No swelling.  Skin:    General: Skin is warm and dry.     Coloration: Skin is not jaundiced.     Findings: No bruising.  Neurological:     Mental Status: She is alert.     MAU Course  Procedures  Results for orders placed or performed during the hospital encounter of 11/28/20 (from the past 24 hour(s))  Urinalysis, Routine w reflex microscopic Urine, Clean Catch     Status: Abnormal   Collection Time: 11/28/20  8:32 AM  Result Value Ref Range   Color, Urine YELLOW YELLOW   APPearance HAZY (A) CLEAR   Specific Gravity, Urine 1.009 1.005 - 1.030   pH 8.0 5.0 - 8.0   Glucose, UA NEGATIVE NEGATIVE mg/dL   Hgb urine dipstick NEGATIVE NEGATIVE   Bilirubin Urine NEGATIVE NEGATIVE   Ketones, ur NEGATIVE NEGATIVE mg/dL   Protein, ur NEGATIVE NEGATIVE mg/dL   Nitrite NEGATIVE NEGATIVE   Leukocytes,Ua NEGATIVE NEGATIVE  CBC     Status: Abnormal   Collection Time: 11/28/20  9:34 AM  Result Value Ref Range   WBC 10.2  4.0 - 10.5 K/uL   RBC 3.78 (L) 3.87 - 5.11 MIL/uL   Hemoglobin 11.4 (L) 12.0 - 15.0 g/dL   HCT 34.1 (L) 36 - 46 %   MCV 90.2 80.0 - 100.0 fL   MCH 30.2 26.0 - 34.0 pg   MCHC 33.4 30.0 - 36.0 g/dL   RDW 12.6 11.5 - 15.5 %   Platelets 219 150 - 400 K/uL  nRBC 0.0 0.0 - 0.2 %  Comprehensive metabolic panel     Status: Abnormal   Collection Time: 11/28/20  9:34 AM  Result Value Ref Range   Sodium 134 (L) 135 - 145 mmol/L   Potassium 4.2 3.5 - 5.1 mmol/L   Chloride 101 98 - 111 mmol/L   CO2 25 22 - 32 mmol/L   Glucose, Bld 84 70 - 99 mg/dL   BUN 6 6 - 20 mg/dL   Creatinine, Ser 0.46 0.44 - 1.00 mg/dL   Calcium 8.9 8.9 - 10.3 mg/dL   Total Protein 6.0 (L) 6.5 - 8.1 g/dL   Albumin 2.7 (L) 3.5 - 5.0 g/dL   AST 19 15 - 41 U/L   ALT 15 0 - 44 U/L   Alkaline Phosphatase 80 38 - 126 U/L   Total Bilirubin 0.5 0.3 - 1.2 mg/dL   GFR, Estimated >60 >60 mL/min   Anion gap 8 5 - 15  Magnesium     Status: Abnormal   Collection Time: 11/28/20  9:34 AM  Result Value Ref Range   Magnesium 1.6 (L) 1.7 - 2.4 mg/dL   Meds ordered this encounter  Medications  . metoprolol succinate (TOPROL-XL) 25 MG 24 hr tablet    Sig: Take 0.5 tablets (12.5 mg total) by mouth at bedtime.    Dispense:  30 tablet    Refill:  0   Orders Placed This Encounter  Procedures  . Urinalysis, Routine w reflex microscopic Urine, Clean Catch    Standing Status:   Standing    Number of Occurrences:   1  . CBC    Standing Status:   Standing    Number of Occurrences:   1  . Comprehensive metabolic panel    Standing Status:   Standing    Number of Occurrences:   1  . Magnesium    Standing Status:   Standing    Number of Occurrences:   1  . EKG 12-Lead    Standing Status:   Standing    Number of Occurrences:   1    Order Specific Question:   Reason for Exam    Answer:   dizziness  . Discharge patient Discharge disposition: 01-Home or Self Care; Discharge patient date: 11/28/2020    Standing Status:   Standing     Number of Occurrences:   1    Order Specific Question:   Discharge disposition    Answer:   01-Home or Self Care [1]    Order Specific Question:   Discharge patient date    Answer:   11/28/2020   MDM Vickie Sutton is a 19 year old G1P0 who presents today for dizziness and fatigue. Possible causes include a medication side effect, POTS exacerbation, low blood glucose, dehydration, and iron deficiency anemia.  Assessment and Plan  1. Medication side effect - Patient advised to take her Metoprolol in the evening before bedtime and see if she notices an improvement in her dizziness spells. Patient told to make sure she is balanced if she needs to get up during the night. - Patient's lab results were reviewed at bedside with all questions answered.  - Patient discharged home.   Laurena Spies PA-S 11/28/2020, 11:59 AM

## 2020-11-28 NOTE — MAU Note (Signed)
Vickie Sutton presents to unit with c/o of dizziness and fatigue since 0500; denies pain, but states she is feeling pressure in lower abdomen

## 2020-11-28 NOTE — Telephone Encounter (Signed)
   The patient called the answering service after-hours today. Chart reviewed. Of note she is [redacted] weeks pregnant.  This morning around 5:30am she developed change in her vision with persistently blurry vision. Any time she walks she feels drunk and off balance. Took her BP and it was 100/56, retook it was 95/54 (this is not uncommon for her to have these kinds of BP). HR 86. She has had visual changes as outlined earlier in chart but this has persisted. Also very lightheaded when she bends over and stands up.  Given visual changes and gait instability this is worrisome that she needs to be checked out from neurologic standpoint. Sx could be due to soft BP but cannot exclude possibility of other etiology over the phone especially in pregnancy. Recommended she proceed to [facility] ED for further evaluation, offered EMS as option for transport. The patient verbalized understanding and gratitude.  Charlie Pitter, PA-C

## 2020-11-28 NOTE — Discharge Instructions (Signed)
Start taking metoprolol at night.  Follow up with OB as scheduled. Return with persistent dizziness

## 2020-12-06 ENCOUNTER — Ambulatory Visit (INDEPENDENT_AMBULATORY_CARE_PROVIDER_SITE_OTHER): Payer: 59

## 2020-12-06 ENCOUNTER — Encounter: Payer: Self-pay | Admitting: Obstetrics & Gynecology

## 2020-12-06 ENCOUNTER — Ambulatory Visit (INDEPENDENT_AMBULATORY_CARE_PROVIDER_SITE_OTHER): Payer: 59 | Admitting: Obstetrics & Gynecology

## 2020-12-06 ENCOUNTER — Other Ambulatory Visit: Payer: Self-pay

## 2020-12-06 VITALS — BP 103/63 | HR 96 | Wt 178.0 lb

## 2020-12-06 DIAGNOSIS — Z79899 Other long term (current) drug therapy: Secondary | ICD-10-CM

## 2020-12-06 DIAGNOSIS — Z3A30 30 weeks gestation of pregnancy: Secondary | ICD-10-CM

## 2020-12-06 DIAGNOSIS — I498 Other specified cardiac arrhythmias: Secondary | ICD-10-CM

## 2020-12-06 DIAGNOSIS — Z3403 Encounter for supervision of normal first pregnancy, third trimester: Secondary | ICD-10-CM

## 2020-12-06 DIAGNOSIS — Z3402 Encounter for supervision of normal first pregnancy, second trimester: Secondary | ICD-10-CM

## 2020-12-06 DIAGNOSIS — Z1389 Encounter for screening for other disorder: Secondary | ICD-10-CM

## 2020-12-06 DIAGNOSIS — G90A Postural orthostatic tachycardia syndrome (POTS): Secondary | ICD-10-CM

## 2020-12-06 DIAGNOSIS — Z331 Pregnant state, incidental: Secondary | ICD-10-CM

## 2020-12-06 LAB — POCT URINALYSIS DIPSTICK OB
Blood, UA: NEGATIVE
Ketones, UA: NEGATIVE
Leukocytes, UA: NEGATIVE
Nitrite, UA: NEGATIVE
POC,PROTEIN,UA: NEGATIVE

## 2020-12-06 NOTE — Progress Notes (Signed)
° °  LOW-RISK PREGNANCY VISIT Patient name: Vickie Sutton MRN [id number]  Date of birth: [birth year] Chief Complaint:   Routine Prenatal Visit  History of Present Illness:   Vickie Sutton is a 19 y.o. G8P0 female at [redacted]w[redacted]d with an Estimated Date of Delivery: 02/14/21 being seen today for ongoing management of a low-risk pregnancy.  Depression screen [facility] 2/9 08/08/2020  Decreased Interest 0  Down, Depressed, Hopeless 0  PHQ - 2 Score 0  Altered sleeping 0  Tired, decreased energy 2  Change in appetite 0  Feeling bad or failure about yourself  0  Trouble concentrating 0  Moving slowly or fidgety/restless 0  Suicidal thoughts 0  PHQ-9 Score 2    Today she reports no complaints. Contractions: Not present. Vag. Bleeding: None.  Movement: Present. denies leaking of fluid. Review of Systems:   Pertinent items are noted in HPI Denies abnormal vaginal discharge w/ itching/odor/irritation, headaches, visual changes, shortness of breath, chest pain, abdominal pain, severe nausea/vomiting, or problems with urination or bowel movements unless otherwise stated above. Pertinent History Reviewed:  Reviewed past medical,surgical, social, obstetrical and family history.  Reviewed problem list, medications and allergies. Physical Assessment:   Vitals:   12/06/20 0909  BP: 103/63  Pulse: 96  Weight: 178 lb (80.7 kg)  Body mass index is 29.62 kg/m.        Physical Examination:   General appearance: Well appearing, and in no distress  Mental status: Alert, oriented to person, place, and time  Skin: Warm & dry  Cardiovascular: Normal heart rate noted  Respiratory: Normal respiratory effort, no distress  Abdomen: Soft, gravid, nontender  Pelvic: Cervical exam deferred         Extremities: Edema: None  Fetal Status: Fetal Heart Rate (bpm): 152   Movement: Present    Chaperone: n/a    Results for orders placed or performed in visit on 12/06/20 (from the past 24 hour(s))  POC  Urinalysis Dipstick OB   Collection Time: 12/06/20  9:12 AM  Result Value Ref Range   Color, UA     Clarity, UA     Glucose, UA Trace (A) Negative   Bilirubin, UA     Ketones, UA neg    Spec Grav, UA     Blood, UA neg    pH, UA     POC,PROTEIN,UA Negative Negative, Trace, Small (1+), Moderate (2+), Large (3+), 4+   Urobilinogen, UA     Nitrite, UA neg    Leukocytes, UA Negative Negative   Appearance     Odor      Assessment & Plan:  1) Low-risk pregnancy G1P0 at [redacted]w[redacted]d with an Estimated Date of Delivery: 02/14/21   Normal EFW today on sonogram, recommend repeat at 36 weeks   Meds: No orders of the defined types were placed in this encounter.  Labs/procedures today:   Plan:  Continue routine obstetrical care  Next visit: prefers in person    Reviewed: Term labor symptoms and general obstetric precautions including but not limited to vaginal bleeding, contractions, leaking of fluid and fetal movement were reviewed in detail with the patient.  All questions were answered. Has home bp cuff. Rx faxed to  Check bp weekly, let us know if >140/90.   Follow-up: Return in about 3 weeks (around 12/27/2020) for [place].  Orders Placed This Encounter  Procedures   POC Urinalysis Dipstick OB    Florian Buff, MD 12/06/2020 9:41 AM

## 2020-12-06 NOTE — Progress Notes (Signed)
[place] 30 wks,cephalic,anterior placenta gr 1,fhr 154 bpm,AFI 12.8 cm,normal ovaries,efw 1505 g 40%

## 2020-12-14 ENCOUNTER — Inpatient Hospital Stay (HOSPITAL_COMMUNITY)
Admission: AD | Admit: 2020-12-14 | Discharge: 2020-12-14 | Disposition: A | Discharge status: Home / Self Care | Payer: 59 | Attending: Family Medicine | Admitting: Family Medicine

## 2020-12-14 ENCOUNTER — Telehealth: Payer: Self-pay | Admitting: Women's Health

## 2020-12-14 ENCOUNTER — Other Ambulatory Visit: Payer: Self-pay

## 2020-12-14 ENCOUNTER — Telehealth: Payer: Self-pay | Admitting: *Deleted

## 2020-12-14 ENCOUNTER — Encounter (HOSPITAL_COMMUNITY): Payer: Self-pay | Admitting: Family Medicine

## 2020-12-14 DIAGNOSIS — R252 Cramp and spasm: Secondary | ICD-10-CM | POA: Diagnosis not present

## 2020-12-14 DIAGNOSIS — Z3689 Encounter for other specified antenatal screening: Secondary | ICD-10-CM

## 2020-12-14 DIAGNOSIS — O26893 Other specified pregnancy related conditions, third trimester: Secondary | ICD-10-CM | POA: Diagnosis not present

## 2020-12-14 DIAGNOSIS — O99891 Other specified diseases and conditions complicating pregnancy: Secondary | ICD-10-CM | POA: Diagnosis not present

## 2020-12-14 DIAGNOSIS — M79605 Pain in left leg: Secondary | ICD-10-CM

## 2020-12-14 DIAGNOSIS — Z3A31 31 weeks gestation of pregnancy: Secondary | ICD-10-CM | POA: Insufficient documentation

## 2020-12-14 DIAGNOSIS — M25472 Effusion, left ankle: Secondary | ICD-10-CM | POA: Insufficient documentation

## 2020-12-14 DIAGNOSIS — Z79899 Other long term (current) drug therapy: Secondary | ICD-10-CM | POA: Diagnosis not present

## 2020-12-14 DIAGNOSIS — M79662 Pain in left lower leg: Secondary | ICD-10-CM | POA: Diagnosis not present

## 2020-12-14 LAB — URINALYSIS, ROUTINE W REFLEX MICROSCOPIC
Bilirubin Urine: NEGATIVE
Glucose, UA: NEGATIVE mg/dL
Hgb urine dipstick: NEGATIVE
Ketones, ur: NEGATIVE mg/dL
Leukocytes,Ua: NEGATIVE
Nitrite: NEGATIVE
Protein, ur: NEGATIVE mg/dL
Specific Gravity, Urine: 1.024 (ref 1.005–1.030)
pH: 6 (ref 5.0–8.0)

## 2020-12-14 MED ORDER — ENOXAPARIN SODIUM 40 MG/0.4ML ~~LOC~~ SOLN: 40.0000 mg | Freq: Once | SUBCUTANEOUS | Status: DC

## 2020-12-14 MED ORDER — ENOXAPARIN SODIUM 80 MG/0.8ML ~~LOC~~ SOLN
1.0000 mg/kg | Freq: Once | SUBCUTANEOUS | Status: AC
  Administered 2020-12-14: 80 mg via SUBCUTANEOUS
  Filled 2020-12-14: qty 0.8

## 2020-12-14 MED ORDER — MAG-OXIDE 200 MG PO TABS: 400.0000 mg | ORAL_TABLET | Freq: Every day | ORAL | 3 refills | Status: DC

## 2020-12-14 MED ORDER — COMFORT FIT MATERNITY SUPP SM MISC: 1.0000 [IU] | Freq: Every day | 0 refills | Status: DC | PRN

## 2020-12-14 NOTE — MAU Note (Signed)
Patient reports swelling and tenderness in her left ankle.  Now started to notice the swelling move into her calf.  Denies any injury to her foot/ankle/calf.   Denies any VB/CTX/LOF.

## 2020-12-14 NOTE — Telephone Encounter (Signed)
Patient called earlier today and spoke with one of our nurses regarding the swelling of her foot. Pt states that it started at her ankle and now it has gone up to her calves. Pt states it is so swollen and she feels tightness. Spoke with a nurse and [facility]. Pt verbalized understanding.

## 2020-12-14 NOTE — MAU Provider Note (Signed)
Chief Complaint:  Leg Swelling   Event Date/Time   First Provider Initiated Contact with Patient 12/14/20 1857     HPI: Vickie Sutton is a 19 y.o. G1P0 at [redacted]w[redacted]d who presents to maternity admissions reporting lower left leg pain and swelling that has been getting progressively worse over the past two weeks. She initially noticed pain and some swelling in her ankle, both have progressed to her left ankle and calf. 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 She wears compression socks and elevates her feet after work each day. She has noticed the swelling is worse at the end of the day and gets better with elevation and rest but is concerned because the swelling is only present on one side. She denies chest pain, cough, or hemoptysis and has no history of DVT. She endorses nightly leg cramps in both legs 2-3x/night.  Past Medical History:  Diagnosis Date  . ADD (attention deficit disorder)   . Anxiety   . Asthma   . Migraine   . Panic attacks   . Polycystic ovarian syndrome   . POTS (postural orthostatic tachycardia syndrome)    OB History  Gravida Para Term Preterm AB Living  1            SAB IAB Ectopic Multiple Live Births               # Outcome Date GA Lbr Len/2nd Weight Sex Delivery Anes PTL Lv  1 Current            Past Surgical History:  Procedure Laterality Date  . ESOPHAGOGASTRODUODENOSCOPY    . LAPAROTOMY N/A 09/07/2020   Procedure: EXPLORATORY LAPAROTOMY WITH EXTRACTION  OVARIAN CYST;  Surgeon: Florian Buff, MD;  Location: [facility];  Service: Gynecology;  Laterality: N/A;  . ROUX-EN-Y GASTRIC BYPASS     August 2020 - [facility]  . TOOTH EXTRACTION N/A 12/12/2015   Procedure: EXTRACTION MOLARS - teeth one, sixteen, seventeen and thirty-two;  Surgeon: Diona Browner, DDS;  Location: [facility];  Service: Oral Surgery;  Laterality: N/A;   Family History  Problem Relation Age of Onset  . Obesity Mother   . Diabetes Mellitus II Maternal Grandmother   . Heart  disease Maternal Grandmother   . Yves Dill Parkinson White syndrome Brother   . Seizures Maternal Aunt    Social History   Tobacco Use  . Smoking status: Never Smoker  . Smokeless tobacco: Never Used  Vaping Use  . Vaping Use: Never used  Substance Use Topics  . Alcohol use: No  . Drug use: No   No Known Allergies Medications Prior to Admission  Medication Sig Dispense Refill Last Dose  . albuterol (VENTOLIN HFA) 108 (90 Base) MCG/ACT inhaler Inhale 2 puffs into the lungs 4 (four) times daily as needed for wheezing or shortness of breath.    Past Month at Unknown time  . Cyanocobalamin (VITAMIN B12 PO) Take 1 tablet by mouth daily.    12/14/2020 at Unknown time  . Docusate Sodium (STOOL SOFTENER LAXATIVE PO) Take 1 tablet by mouth daily.    12/14/2020 at Unknown time  . Ferrous Sulfate (IRON PO) Take 1 tablet by mouth daily.   12/14/2020 at Unknown time  . FOLIC ACID PO Take 1 tablet by mouth daily.   12/14/2020 at Unknown time  . metoprolol succinate (TOPROL-XL) 25 MG 24 hr tablet Take 0.5 tablets (12.5 mg total) by mouth at bedtime. 30 tablet 0 12/13/2020 at  Unknown time  . Prenatal Vit-Fe Fumarate-FA (PRENATAL MULTIVITAMIN) TABS tablet Take 1 tablet by mouth daily.    12/14/2020 at Unknown time  . Polyethylene Glycol 3350 (MIRALAX PO) Take 17 g by mouth daily as needed (constipation).    More than a month at Unknown time    I have reviewed patient's Past Medical Hx, Surgical Hx, Family Hx, Social Hx, medications and allergies.   ROS:  Review of Systems  Respiratory: Negative for apnea, cough, chest tightness and shortness of breath.   Cardiovascular: Positive for leg swelling. Negative for chest pain and palpitations.  Musculoskeletal: Positive for arthralgias (left ankle) and myalgias (left calf).  Neurological: Negative for dizziness, syncope, light-headedness and headaches.  Sutton other systems reviewed and are negative.   Physical Exam   Patient Vitals for the past 24 hrs:   BP Temp Pulse Resp  12/14/20 1746 (!) 103/59 98.1 F (36.7 C) 82 16    Constitutional: Well-developed, well-nourished female in no acute distress.  Cardiovascular: normal rate & rhythm, no murmur Respiratory: normal effort, lung sounds clear throughout GI: Abd soft, non-tender, gravid appropriate for gestational age. Pos BS x 4 MS: Extremities nontender, no edema, normal ROM. Left leg tender from the ankle up through her calf with positive Homan's sign but no redness, notable swelling, pitting edema, or knots. Calves and ankles measure the same circumference. Neurologic: Alert and oriented x 4.  GU: no CVA tenderness Pelvic: NEFG, physiologic discharge, no blood, cervix clean.   Fetal Tracing: reactive Baseline: 135 Variability: moderate Accelerations: present Decelerations: none Toco: relaxed   Labs: Results for orders placed or performed during the hospital encounter of 12/14/20 (from the past 24 hour(s))  Urinalysis, Routine w reflex microscopic Urine, Clean Catch     Status: Abnormal   Collection Time: 12/14/20  6:08 PM  Result Value Ref Range   Color, Urine YELLOW YELLOW   APPearance CLOUDY (A) CLEAR   Specific Gravity, Urine 1.024 1.005 - 1.030   pH 6.0 5.0 - 8.0   Glucose, UA NEGATIVE NEGATIVE mg/dL   Hgb urine dipstick NEGATIVE NEGATIVE   Bilirubin Urine NEGATIVE NEGATIVE   Ketones, ur NEGATIVE NEGATIVE mg/dL   Protein, ur NEGATIVE NEGATIVE mg/dL   Nitrite NEGATIVE NEGATIVE   Leukocytes,Ua NEGATIVE NEGATIVE    Imaging:  No results found.  MAU Course: Orders Placed This Encounter  Procedures  . Urinalysis, Routine w reflex microscopic Urine, Clean Catch  . Discharge patient  . VAS [place] LOWER EXTREMITY VENOUS (DVT)   Meds ordered this encounter  Medications  . DISCONTD: enoxaparin (LOVENOX) injection 40 mg  . enoxaparin (LOVENOX) injection 80 mg  . Magnesium Oxide (MAG-OXIDE) 200 MG TABS    Sig: Take 2 tablets (400 mg total) by mouth at bedtime. If that  amount causes loose stools in the am, switch to 200mg  daily at bedtime.    Dispense:  60 tablet    Refill:  3    Order Specific Question:   Supervising Provider    Answer:   Donnamae Jude [[id number]]  . Elastic Bandages & Supports (COMFORT FIT MATERNITY SUPP SM) MISC    Sig: 1 Units by Does not apply route daily as needed.    Dispense:  1 each    Refill:  0    Order Specific Question:   Supervising Provider    Answer:   Merrily Pew    MDM: No noticeable swelling/redness but tenderness and +Homan's sign - discussed with Dr. Kennon Rounds who confirmed  decision to give prophylactic Lovenox tonight and order doppler study for tomorrow morning.  Discussed her nightly cramping and reviewed daily stretching for optimal fetal positioning, recommended a glass of Gatorade prior to bed and nightly magnesium. Also recommended a maternity belt for her to wear at work each day.  Assessment: 1. Left leg pain   2. NST (non-stress test) reactive    Plan: Discharge home in stable condition with return precautions.  Order for venous doppler study in the morning, pt will also follow up with routine OB care at [facility]  Follow-up Information    [facility] Go to.   Why: Go to [facility] Ad[facility] at 8am tomorrow morning for your doppler study Contact information: [address] [place] [address] [phone]              Allergies as of 12/14/2020   No Known Allergies     Medication List    TAKE these medications   albuterol 108 (90 Base) MCG/ACT inhaler Commonly known as: VENTOLIN HFA Inhale 2 puffs into the lungs 4 (four) times daily as needed for wheezing or shortness of breath.   Comfort Fit Maternity Supp Sm Misc 1 Units by Does not apply route daily as needed.   FOLIC ACID PO Take 1 tablet by mouth daily.   IRON PO Take 1 tablet by mouth daily.   Mag-Oxide 200 MG Tabs Generic drug: Magnesium Oxide Take 2 tablets (400 mg  total) by mouth at bedtime. If that amount causes loose stools in the am, switch to 200mg  daily at bedtime.   metoprolol succinate 25 MG 24 hr tablet Commonly known as: TOPROL-XL Take 0.5 tablets (12.5 mg total) by mouth at bedtime.   MIRALAX PO Take 17 g by mouth daily as needed (constipation).   prenatal multivitamin Tabs tablet Take 1 tablet by mouth daily.   STOOL SOFTENER LAXATIVE PO Take 1 tablet by mouth daily.   VITAMIN B12 PO Take 1 tablet by mouth daily.      , CNM, MSN, [facility] 12/14/20 8:05 PM

## 2020-12-14 NOTE — Discharge Instructions (Signed)
Pregnancy and Sexually Transmitted Infections An STI (sexually transmitted infection) is a disease or infection that may be passed (transmitted) from person to person, usually during sexual activity. This may happen by way of saliva, semen, blood, vaginal mucus, or urine. An STI can be caused by bacteria, viruses, or parasites. During pregnancy, STIs can be dangerous for you and your unborn baby. It is important to take steps to reduce your chances of getting an STI. Also, you need to be seen by your health care provider right away if you think you may have an STI, or if you think you may have been exposed to an STI. Diagnosis and treatment will depend on the type of STI. If you are already pregnant, you will be screened for HIV (human immunodeficiency virus) early in your pregnancy. If you are at high risk for HIV, this test may be repeated during your third trimester of pregnancy. What are some common STIs? There are different types of STIs. Some STIs that cause problems in pregnancy include:  Gonorrhea.  Chlamydia.  Syphilis.  HIV and AIDS (acquired immunodeficiency syndrome).  Genital herpes.  Hepatitis.  Genital warts.  Human papillomavirus (HPV).  Trichomoniasis. STIs that do not affect the baby include:  Chancroid.  Pubic lice. What are the possible effects of STIs during pregnancy? STIs can cause:  Stillbirth.  Miscarriage.  Premature labor.  Premature rupture of the membranes.  Serious birth defects or deformities.  Infection of the amniotic sac.  Infections that occur after birth (postpartum) in you and the baby.  Slowed growth of the baby before birth.  Illnesses in newborns. What are common symptoms of STIs? Different STIs have different symptoms. Some women may not have any symptoms. If symptoms are present, they may include:  Painful or bloody urination.  Pain in the pelvis, abdomen, vagina, anus, throat, or eyes.  A skin rash, itching, or  irritation.  Growths, ulcerations, blisters, or sores in the genital and anal areas.  Fever.  Abnormal vaginal discharge, with or without bad odor.  Pain or bleeding during sexual intercourse.  Yellowing of the skin and the white parts of the eyes (jaundice).  Swollen glands in the groin area. Even if symptoms are not present, an STI can still be passed to another person during sexual contact. How are STIs diagnosed? Your health care provider can use tests to determine if you have an STI. These may include blood tests, urine tests, and tests performed during a pelvic exam. You should be screened for STIs, including gonorrhea and chlamydia, if:  You are sexually active and are younger than age 75.  You are age 26 or older and your health care provider tells you that you are at risk for these types of infections.  Your sexual activity has changed since the last time you were screened, and you are at an increased risk for chlamydia or gonorrhea. Ask your health care provider if you are at risk. How can I reduce my risk of getting an STI? Take these actions to reduce your risk of getting an STI:  Do not have any oral, vaginal, or anal sex. This is known as practicing abstinence.  If you have sex, use a latex condom or a female condom consistently and correctly during sexual intercourse.  Use dental dams and water-soluble lubricants during sexual activity. Do not use petroleum jelly or oils.  Avoid having multiple sexual partners.  Do not have sex with someone who has other sexual partners.  Do  not have sex with anyone you do not know or who is at high risk for an STI.  Avoid risky sex acts that can break the skin.  Do not have sex if you have open sores on your mouth or skin.  Avoid engaging in oral and anal sex acts.  Get the hepatitis vaccine. It is safe for pregnant women.  What should I do if I think I have an STI?  See your health care provider.  Tell your sexual  partner or partners. They should be tested and treated for any STIs.  Do not have sex until your health care provider says it is okay. Get help right away if: Contact your health care provider right away if:  You have any symptoms of an STI.  You think that you have, or your sexual partner has, an STI even if there are no symptoms.  You think that you may have been exposed to an STI. This information is not intended to replace advice given to you by your health care provider. Make sure you discuss any questions you have with your health care provider. Document Revised: 04/03/2019 Document Reviewed: 07/16/2016 Elsevier Patient Education  [place]: You are not alone, Seventy-five percent of women have some sort of abdominal or back pain at some point in their pregnancy. Your baby is growing at a fast pace, which means that your whole body is rapidly trying to adjust to the changes. As your uterus grows, your back may start feeling a bit under stress and this can result in back or abdominal pain that can go from mild, and therefore bearable, to severe pains that will not allow you to sit or lay down comfortably, When it comes to dealing with pregnancy-related pains and cramps, some pregnant women usually prefer natural remedies, which the market is filled with nowadays. For example, wearing a pregnancy support belt can help ease and lessen your discomfort and pain. WHAT ARE THE BENEFITS OF WEARING A PREGNANCY SUPPORT BELT? A pregnancy support belt provides support to the lower portion of the belly taking some of the weight of the growing uterus and distributing to the other parts of your body. It is designed make you comfortable and gives you extra support. Over the years, the pregnancy apparel market has been studying the needs and wants of pregnant women and they have come up with the most comfortable pregnancy support belts that woman could ever ask for. In fact,  you will no longer have to wear a stretched-out or bulky pregnancy belt that is visible underneath your clothes and makes you feel even more uncomfortable. Nowadays, a pregnancy support belt is made of comfortable and stretchy materials that will not irritate your skin but will actually make you feel at ease and you will not even notice you are wearing it. They are easy to put on and adjust during the day and can be worn at night for additional support.  BENEFITS: . Relives Back pain . Relieves Abdominal Muscle and Leg Pain . Stabilizes the Pelvic Ring . Offers a Cushioned Abdominal Lift Pad . Relieves pressure on the Sciatic Nerve Within Minutes WHERE TO GET YOUR PREGNANCY BELT: [employer] [phone] @[phone], [place] [address]   Deep Vein Thrombosis  Deep vein thrombosis (DVT) is a condition in which a blood clot forms in a deep vein, such as a lower leg, thigh, or arm vein. A clot is blood  that has thickened into a gel or solid. This condition is dangerous. It can lead to serious and even life-threatening complications if the clot travels to the lungs and causes a blockage (pulmonary embolism). It can also damage veins in the leg. This can result in leg pain, swelling, discoloration, and sores (post-thrombotic syndrome). What are the causes? This condition may be caused by:  A slowdown of blood flow.  Damage to a vein.  A condition that causes blood to clot more easily, such as an inherited clotting disorder. What increases the risk? The following factors may make you more likely to develop this condition:  Being overweight.  Being older, especially over age 46.  Sitting or lying down for more than four hours.  Being in the hospital.  Lack of physical activity (sedentary lifestyle).  Pregnancy, being in childbirth, or having recently given birth.  Taking medicines that contain estrogen, such as medicines to prevent pregnancy.  Smoking.  A  history of any of the following: ? Blood clots or a blood clotting disease. ? Peripheral vascular disease. ? Inflammatory bowel disease. ? Cancer. ? Heart disease. ? Genetic conditions that affect how your blood clots, such as Factor V Leiden mutation. ? Neurological diseases that affect your legs (leg paresis). ? A recent injury, such as a car accident. ? Major or lengthy surgery. ? A central line placed inside a large vein. What are the signs or symptoms? Symptoms of this condition include:  Swelling, pain, or tenderness in an arm or leg.  Warmth, redness, or discoloration in an arm or leg. If the clot is in your leg, symptoms may be more noticeable or worse when you stand or walk. Some people may not develop any symptoms. How is this diagnosed? This condition is diagnosed with:  A medical history and physical exam.  Tests, such as: ? Blood tests. These are done to check how well your blood clots. ? Ultrasound. This is done to check for clots. ? Venogram. For this test, contrast dye is injected into a vein and X-rays are taken to check for any clots. How is this treated? Treatment for this condition depends on:  The cause of your DVT.  Your risk for bleeding or developing more clots.  Any other medical conditions that you have. Treatment may include:  Taking a blood thinner (anticoagulant). This type of medicine prevents clots from forming. It may be taken by mouth, injected under the skin, or injected through an IV (catheter).  Injecting clot-dissolving medicines into the affected vein (catheter-directed thrombolysis).  Having surgery. Surgery may be done to: ? Remove the clot. ? Place a filter in a large vein to catch blood clots before they reach the lungs. Some treatments may be continued for up to six months. Follow these instructions at home: If you are taking blood thinners:  Take the medicine exactly as told by your health care provider. Some blood thinners  need to be taken at the same time every day. Do not skip a dose.  Talk with your health care provider before you take any medicines that contain aspirin or NSAIDs. These medicines increase your risk for dangerous bleeding.  Ask your health care provider about foods and drugs that could change the way the medicine works (may interact). Avoid those things if your health care provider tells you to do so.  Blood thinners can cause easy bruising and may make it difficult to stop bleeding. Because of this: ? Be very careful when using knives,  scissors, or other sharp objects. ? Use an electric razor instead of a blade. ? Avoid activities that could cause injury or bruising, and follow instructions about how to prevent falls.  Wear a medical alert bracelet or carry a card that lists what medicines you take. General instructions  Take over-the-counter and prescription medicines only as told by your health care provider.  Return to your normal activities as told by your health care provider. Ask your health care provider what activities are safe for you.  Wear compression stockings if recommended by your health care provider.  Keep all follow-up visits as told by your health care provider. This is important. How is this prevented? To lower your risk of developing this condition again:  For 30 or more minutes every day, do an activity that: ? Involves moving your arms and legs. ? Increases your heart rate.  When traveling for longer than four hours: ? Exercise your arms and legs every hour. ? Drink plenty of water. ? Avoid drinking alcohol.  Avoid sitting or lying for a long time without moving your legs.  If you have surgery or you are hospitalized, ask about ways to prevent blood clots. These may include taking frequent walks or using anticoagulants.  Stay at a healthy weight.  If you are a woman who is older than age 86, avoid unnecessary use of medicines that contain estrogen, such  as some birth control pills.  Do not use any products that contain nicotine or tobacco, such as cigarettes and e-cigarettes. This is especially important if you take estrogen medicines. If you need help quitting, ask your health care provider. Contact a health care provider if:  You miss a dose of your blood thinner.  Your menstrual period is heavier than usual.  You have unusual bruising. Get help right away if:  You have: ? New or increased pain, swelling, or redness in an arm or leg. ? Numbness or tingling in an arm or leg. ? Shortness of breath. ? Chest pain. ? A rapid or irregular heartbeat. ? A severe headache or confusion. ? A cut that will not stop bleeding.  There is blood in your vomit, stool, or urine.  You have a serious fall or accident, or you hit your head.  You feel light-headed or dizzy.  You cough up blood. These symptoms may represent a serious problem that is an emergency. Do not wait to see if the symptoms will go away. Get medical help right away. Call your local emergency services (911 in the [place] Do not drive yourself to the hospital. Summary  Deep vein thrombosis (DVT) is a condition in which a blood clot forms in a deep vein, such as a lower leg, thigh, or arm vein.  Symptoms can include swelling, warmth, pain, and redness in your leg or arm.  This condition may be treated with a blood thinner (anticoagulant medicine), medicine that is injected to dissolve blood clots,compression stockings, or surgery.  If you are prescribed blood thinners, take them exactly as told. This information is not intended to replace advice given to you by your health care provider. Make sure you discuss any questions you have with your health care provider. Document Revised: 11/22/2017 Document Reviewed: 05/10/2017 Elsevier Patient Education  [place].

## 2020-12-14 NOTE — Telephone Encounter (Signed)
Patient states she has noticed swelling in her left foot which seems to be a little more swollen than her right.  She works 12 hour shifts at [employer] so is unsure if being on her feet working may be causing this.  She does wear compression hose when working.  Denies pain, warmth or redness to that foot.  Encouraged patient to elevate feet when possible but to continue to monitor the foot.  If she starts to notice the swelling is starting to rise up her leg, causes pain with walking, redness or tenderness, then she would need to have this evaluated. Pt verbalized understanding and all questions answered.

## 2020-12-15 ENCOUNTER — Telehealth: Payer: Self-pay | Admitting: Obstetrics and Gynecology

## 2020-12-15 ENCOUNTER — Ambulatory Visit (HOSPITAL_COMMUNITY)
Admission: RE | Admit: 2020-12-15 | Discharge: 2020-12-15 | Disposition: A | Discharge status: Home / Self Care | Payer: 59 | Source: Ambulatory Visit | Attending: Obstetrics and Gynecology | Admitting: Obstetrics and Gynecology

## 2020-12-15 DIAGNOSIS — M79662 Pain in left lower leg: Secondary | ICD-10-CM | POA: Diagnosis not present

## 2020-12-15 DIAGNOSIS — O339 Maternal care for disproportion, unspecified: Secondary | ICD-10-CM | POA: Diagnosis not present

## 2020-12-15 DIAGNOSIS — M7989 Other specified soft tissue disorders: Secondary | ICD-10-CM

## 2020-12-15 NOTE — Progress Notes (Signed)
Bilateral lower extremity venous duplex completed. Refer to "CV Proc" under chart review to view preliminary results.  12/15/2020 10:00 AM Kelby Aline., MHA, RVT, RDCS, RDMS

## 2020-12-15 NOTE — Telephone Encounter (Signed)
Pt seen in MAU on 12/14/20 given concern of LLE pain and swelling in pregnancy. Order placed for outpatient venous dopplers (not previously placed).  Randa Ngo, MD OB Fellow, [facility] 12/15/2020 8:53 AM

## 2020-12-15 NOTE — Telephone Encounter (Signed)
Called pt to discuss result of negative LE venous dopplers given pt's concern of left leg swelling and pain in MAU on 12/22. Pt reported improved in symptoms with plan to f/u with prenatal provider as previously scheduled. Encouraged pt to call the clinic if concerns of worsening symptoms prior to her next appointment.  Randa Ngo, MD OB Fellow, [facility] 12/15/2020 4:51 PM

## 2020-12-24 NOTE — L&D Delivery Note (Addendum)
OB/GYN Faculty Practice Delivery Note  Vickie Sutton is a 20 y.o. G1P0 s/p SVD at [redacted]w[redacted]d. She was admitted for early labor/SOL.   ROM: 5h 29m with clear fluid GBS Status: Negative Maximum Maternal Temperature: 98.9  Labor Progress: . Presented in early labor.  Progressed to active labor.  AROM with clear fluid.  When complete, began pushing for 1 hour with little progress.  Stopped pushing, started pitocin, and labored down for approximately 2.5 hours before beginning pushing again.   Delivery Date/Time: 02/10/2021 at 0404 Delivery: Called to room and patient was complete and pushing. Head delivered direct OA.  Had a loose nuchal cord and cord was loosely wrapped around body and leg and was delivered through. Shoulder and body delivered in usual fashion. Infant with spontaneous cry, placed on mother's abdomen, dried and stimulated. Cord clamped x 2 after 1-minute delay, and cut by FOB under my direct supervision. Cord blood drawn. Placenta delivered spontaneously with gentle cord traction. Fundus firm with massage and Pitocin. Labia, perineum, vagina, and cervix were inspected, found to have bilateral labial lacerations.   Placenta: Intact, 3 vessel cord Complications: None Lacerations: Right labial laceration repaired with 3-0 Vicryl Rapide with hemostasis after repair.  Left labial laceration was hemostatic and did not require repair. EBL: 100 mL Analgesia: Epidural  Infant: Viable female  APGARs 8 & 9  weight pending   [name], MD 02/10/2021, 4:37 AM   I was present and gloved for entirety of delivery. I agree with the findings and the plan of care as documented in the resident's note.  Sharene Skeans, MD [facility] Family Medicine Fellow, [facility], [place]

## 2020-12-28 ENCOUNTER — Ambulatory Visit (INDEPENDENT_AMBULATORY_CARE_PROVIDER_SITE_OTHER): Payer: 59 | Admitting: Medical

## 2020-12-28 ENCOUNTER — Encounter: Payer: Self-pay | Admitting: Medical

## 2020-12-28 ENCOUNTER — Other Ambulatory Visit: Payer: Self-pay

## 2020-12-28 VITALS — BP 102/66 | HR 84 | Wt 185.0 lb

## 2020-12-28 DIAGNOSIS — Z9884 Bariatric surgery status: Secondary | ICD-10-CM

## 2020-12-28 DIAGNOSIS — Z1389 Encounter for screening for other disorder: Secondary | ICD-10-CM

## 2020-12-28 DIAGNOSIS — Z3A33 33 weeks gestation of pregnancy: Secondary | ICD-10-CM

## 2020-12-28 DIAGNOSIS — Z3403 Encounter for supervision of normal first pregnancy, third trimester: Secondary | ICD-10-CM

## 2020-12-28 DIAGNOSIS — N83201 Unspecified ovarian cyst, right side: Secondary | ICD-10-CM

## 2020-12-28 LAB — POCT URINALYSIS DIPSTICK OB
Blood, UA: NEGATIVE
Glucose, UA: NEGATIVE
Ketones, UA: NEGATIVE
Leukocytes, UA: NEGATIVE
Nitrite, UA: NEGATIVE
POC,PROTEIN,UA: NEGATIVE

## 2020-12-28 NOTE — Patient Instructions (Signed)
Fetal Movement Counts Patient Name: ________________________________________________ Patient Due Date: ____________________ What is a fetal movement count?  A fetal movement count is the number of times that you feel your baby move during a certain amount of time. This may also be called a fetal kick count. A fetal movement count is recommended for every pregnant woman. You may be asked to start counting fetal movements as early as week 28 of your pregnancy. Pay attention to when your baby is most active. You may notice your baby's sleep and wake cycles. You may also notice things that make your baby move more. You should do a fetal movement count:  When your baby is normally most active.  At the same time each day. A good time to count movements is while you are resting, after having something to eat and drink. How do I count fetal movements? 1. Find a quiet, comfortable area. Sit, or lie down on your side. 2. Write down the date, the start time and stop time, and the number of movements that you felt between those two times. Take this information with you to your health care visits. 3. Write down your start time when you feel the first movement. 4. Count kicks, flutters, swishes, rolls, and jabs. You should feel at least 10 movements. 5. You may stop counting after you have felt 10 movements, or if you have been counting for 2 hours. Write down the stop time. 6. If you do not feel 10 movements in 2 hours, contact your health care provider for further instructions. Your health care provider may want to do additional tests to assess your baby's well-being. Contact a health care provider if:  You feel fewer than 10 movements in 2 hours.  Your baby is not moving like he or she usually does. Date: ____________ Start time: ____________ Stop time: ____________ Movements: ____________ Date: ____________ Start time: ____________ Stop time: ____________ Movements: ____________ Date: ____________  Start time: ____________ Stop time: ____________ Movements: ____________ Date: ____________ Start time: ____________ Stop time: ____________ Movements: ____________ Date: ____________ Start time: ____________ Stop time: ____________ Movements: ____________ Date: ____________ Start time: ____________ Stop time: ____________ Movements: ____________ Date: ____________ Start time: ____________ Stop time: ____________ Movements: ____________ Date: ____________ Start time: ____________ Stop time: ____________ Movements: ____________ Date: ____________ Start time: ____________ Stop time: ____________ Movements: ____________ This information is not intended to replace advice given to you by your health care provider. Make sure you discuss any questions you have with your health care provider. Document Revised: 07/30/2019 Document Reviewed: 07/30/2019 Elsevier Patient Education  2020 Elsevier [employer] of the uterus can occur throughout pregnancy, but they are not always a sign that you are in labor. You may have practice contractions called Braxton Hicks contractions. These false labor contractions are sometimes confused with true labor. What are [place] contractions? Braxton Hicks contractions are tightening movements that occur in the muscles of the uterus before labor. Unlike true labor contractions, these contractions do not result in opening (dilation) and thinning of the cervix. Toward the end of pregnancy (32-34 weeks), Braxton Hicks contractions can happen more often and may become stronger. These contractions are sometimes difficult to tell apart from true labor because they can be very uncomfortable. You should not feel embarrassed if you go to the hospital with false labor. Sometimes, the only way to tell if you are in true labor is for your health care provider to look for changes in the cervix. The health care provider  will do a physical exam and may  monitor your contractions. If you are not in true labor, the exam should show that your cervix is not dilating and your water has not broken. If there are no other health problems associated with your pregnancy, it is completely safe for you to be sent home with false labor. You may continue to have Braxton Hicks contractions until you go into true labor. How to tell the difference between true labor and false labor True labor  Contractions last 30-70 seconds.  Contractions become very regular.  Discomfort is usually felt in the top of the uterus, and it spreads to the lower abdomen and low back.  Contractions do not go away with walking.  Contractions usually become more intense and increase in frequency.  The cervix dilates and gets thinner. False labor  Contractions are usually shorter and not as strong as true labor contractions.  Contractions are usually irregular.  Contractions are often felt in the front of the lower abdomen and in the groin.  Contractions may go away when you walk around or change positions while lying down.  Contractions get weaker and are shorter-lasting as time goes on.  The cervix usually does not dilate or become thin. Follow these instructions at home:   Take over-the-counter and prescription medicines only as told by your health care provider.  Keep up with your usual exercises and follow other instructions from your health care provider.  Eat and drink lightly if you think you are going into labor.  If Braxton Hicks contractions are making you uncomfortable: ? Change your position from lying down or resting to walking, or change from walking to resting. ? Sit and rest in a tub of warm water. ? Drink enough fluid to keep your urine pale yellow. Dehydration may cause these contractions. ? Do slow and deep breathing several times an hour.  Keep all follow-up prenatal visits as told by your health care provider. This is important. Contact a  health care provider if:  You have a fever.  You have continuous pain in your abdomen. Get help right away if:  Your contractions become stronger, more regular, and closer together.  You have fluid leaking or gushing from your vagina.  You pass blood-tinged mucus (bloody show).  You have bleeding from your vagina.  You have low back pain that you never had before.  You feel your baby's head pushing down and causing pelvic pressure.  Your baby is not moving inside you as much as it used to. Summary  Contractions that occur before labor are called Braxton Hicks contractions, false labor, or practice contractions.  Braxton Hicks contractions are usually shorter, weaker, farther apart, and less regular than true labor contractions. True labor contractions usually become progressively stronger and regular, and they become more frequent.  Manage discomfort from Braxton Hicks contractions by changing position, resting in a warm bath, drinking plenty of water, or practicing deep breathing. This information is not intended to replace advice given to you by your health care provider. Make sure you discuss any questions you have with your health care provider. Document Revised: 11/22/2017 Document Reviewed: 04/25/2017 Elsevier Patient Education  2020 Elsevier Inc.  

## 2020-12-28 NOTE — Progress Notes (Signed)
   PRENATAL VISIT NOTE  Subjective:  Vickie Sutton is a 20 y.o. G1P0 at [redacted]w[redacted]d being seen today for ongoing prenatal care.  She is currently monitored for the following issues for this high-risk pregnancy and has POTS (postural orthostatic tachycardia syndrome); Syncope; Asthma; Supervision of normal first pregnancy; History of Roux-en-Y gastric bypass; PCOS (polycystic ovarian syndrome); Anxiety; and Ovarian cyst, right on their problem list.  Patient reports fatigue and leg pain, dizziness on occasion, LE edema.  Contractions: Irritability. Vag. Bleeding: None.  Movement: Present. Denies leaking of fluid.   The following portions of the patient's history were reviewed and updated as appropriate: allergies, current medications, past family history, past medical history, past social history, past surgical history and problem list.   Objective:   Vitals:   12/28/20 1105  BP: 102/66  Pulse: 84  Weight: 185 lb (83.9 kg)    Fetal Status: Fetal Heart Rate (bpm): 145   Movement: Present     General:  Alert, oriented and cooperative. Patient is in no acute distress.  Skin: Skin is warm and dry. No rash noted.   Cardiovascular: Normal heart rate noted  Respiratory: Normal respiratory effort, no problems with respiration noted  Abdomen: Soft, gravid, appropriate for gestational age.  Pain/Pressure: Absent     Pelvic: Cervical exam deferred        Extremities: Normal range of motion.  Edema: None  Mental Status: Normal mood and affect. Normal behavior. Normal judgment and thought content.   Assessment and Plan:  Pregnancy: G1P0 at [redacted]w[redacted]d 1. Encounter for supervision of normal first pregnancy in third trimester - Increase PO hydration - Frequent breaks and elevation of LE for swelling - Discussed concerns for coming out of work early, advised to discuss further with HR at work to determine the options  2. Screening for genitourinary condition - POC Urinalysis Dipstick OB  3. [redacted] weeks  gestation of pregnancy   4. History of Roux-en-Y gastric bypass - Growth [place] at 36 weeks   5. Ovarian cyst, right - Removed 9/15  Preterm labor symptoms and general obstetric precautions including but not limited to vaginal bleeding, contractions, leaking of fluid and fetal movement were reviewed in detail with the patient. Please refer to After Visit Summary for other counseling recommendations.   Return in about 2 weeks (around 01/11/2021) for LOB, In-Person.  Future Appointments  Date Time Provider Department Center  01/11/2021  2:00 PM Jonelle Sidle, MD [facility]  01/13/2021  8:30 AM Hermina Staggers, MD [facility]    Vonzella Nipple, PA-C

## 2021-01-05 ENCOUNTER — Telehealth: Payer: Self-pay | Admitting: Obstetrics & Gynecology

## 2021-01-05 NOTE — Telephone Encounter (Signed)
I would try taking 1/2 the dose, she is symptomatic from hypotension

## 2021-01-05 NOTE — Telephone Encounter (Signed)
Called patient back. She states that she was told to switch the toprol from morning to night to try to alleviate symptoms of dizziness and chest pain she was having. The symptoms are still happening. She called her cardiologist last night and they told her to contact us. She is having more frequent spells from her POTS. Her bp last night was 90/48 she was afraid. She is doing all that she can do such as wearing stockings and staying hydrated. Advised her that I would send message to Dr. Elonda Husky to see if he has any other recommendations for her.

## 2021-01-05 NOTE — Telephone Encounter (Signed)
Patient wants to inform a nurse that she feels dizzy when laying down, chest pain and have a low BP. Clinical staff will follow up with patient.

## 2021-01-05 NOTE — Telephone Encounter (Signed)
Pt called back to report her BP 100/65, Pulse 106 @ 1:20pm today

## 2021-01-05 NOTE — Telephone Encounter (Signed)
Pt advised to cut dose in half and let us know how shes doing at next visit. Patient agrees no other questions at this time.

## 2021-01-10 NOTE — Progress Notes (Signed)
Cardiology Office Note  Date: 01/11/2021   ID: Terrence, Pizana Aug 20, 2001, MRN [id number]  PCP:  Rory Percy, MD  Cardiologist:  Rozann Lesches, MD Electrophysiologist:  None   Chief Complaint  Patient presents with  . Cardiac follow-up    History of Present Illness: Vickie Sutton is a 20 y.o. female last seen in August 2021.  She is here for a follow-up visit.  Anticipates delivery in February, doing well in her pregnancy so far.  She has had some intermittent episodes of dizziness and palpitations, never syncope.  Sometimes occurs while she is seated, and sounds more paroxysmal like SVT (although this is never been clearly documented) rather than POTS.  She was recently started back on Toprol-XL 6.25 mg once in the evening after discussion with her OB.  This has not made much of a difference.  I also talked with her about making sure that she is hydrating and wearing compression stockings.  I reviewed her medications which are outlined below.  Past Medical History:  Diagnosis Date  . ADD (attention deficit disorder)   . Anxiety   . Asthma   . Migraine   . Panic attacks   . Polycystic ovarian syndrome   . POTS (postural orthostatic tachycardia syndrome)     Past Surgical History:  Procedure Laterality Date  . ESOPHAGOGASTRODUODENOSCOPY    . LAPAROTOMY N/A 09/07/2020   Procedure: EXPLORATORY LAPAROTOMY WITH EXTRACTION  OVARIAN CYST;  Surgeon: Florian Buff, MD;  Location: [place];  Service: Gynecology;  Laterality: N/A;  . ROUX-EN-Y GASTRIC BYPASS     August 2020 - [facility]  . TOOTH EXTRACTION N/A 12/12/2015   Procedure: EXTRACTION MOLARS - teeth one, sixteen, seventeen and thirty-two;  Surgeon: Diona Browner, DDS;  Location: [place];  Service: Oral Surgery;  Laterality: N/A;    Current Outpatient Medications  Medication Sig Dispense Refill  . albuterol (VENTOLIN HFA) 108 (90 Base) MCG/ACT inhaler Inhale 2 puffs into the lungs 4 (four) times daily as needed for  wheezing or shortness of breath.     . Cyanocobalamin (VITAMIN B12 PO) Take 1 tablet by mouth daily.     Mariane Baumgarten Sodium (STOOL SOFTENER LAXATIVE PO) Take 1 tablet by mouth daily.     Water engineer Bandages & Supports (COMFORT FIT MATERNITY SUPP SM) MISC 1 Units by Does not apply route daily as needed. 1 each 0  . Ferrous Sulfate (IRON PO) Take 1 tablet by mouth daily.    Marland Kitchen FOLIC ACID PO Take 1 tablet by mouth daily.    . Magnesium Oxide (MAG-OXIDE) 200 MG TABS Take 2 tablets (400 mg total) by mouth at bedtime. If that amount causes loose stools in the am, switch to 200mg  daily at bedtime. 60 tablet 3  . metoprolol succinate (TOPROL-XL) 25 MG 24 hr tablet Take 0.5 tablets (12.5 mg total) by mouth at bedtime. 30 tablet 0  . Polyethylene Glycol 3350 (MIRALAX PO) Take 17 g by mouth daily as needed (constipation).     . Prenatal Vit-Fe Fumarate-FA (PRENATAL MULTIVITAMIN) TABS tablet Take 1 tablet by mouth daily.     . valACYclovir (VALTREX) 1000 MG tablet Take 4,000 mg by mouth once.     No current facility-administered medications for this visit.   Allergies:  Patient has no known allergies.   ROS: No syncope.  Physical Exam: VS:  BP 106/68   Pulse 90   Ht 5\' 5"  (1.651 m)   Wt 185 lb (83.9 kg)  SpO2 99%   BMI 30.79 kg/m , BMI Body mass index is 30.79 kg/m.  Wt Readings from Last 3 Encounters:  01/11/21 185 lb (83.9 kg)  12/28/20 185 lb (83.9 kg)  12/06/20 178 lb (80.7 kg)    General: Patient appears comfortable at rest. HEENT: Conjunctiva and lids normal, wearing a mask. Cardiac: Regular rate and rhythm, no S3 or significant systolic murmur, no pericardial rub. Extremities: No pitting edema.  ECG:  An ECG dated 11/28/2020 was personally reviewed today and demonstrated:  Sinus rhythm with nonspecific ST changes.  Recent Labwork: 11/28/2020: ALT 15; AST 19; BUN 6; Creatinine, Ser 0.46; Hemoglobin 11.4; Magnesium 1.6; Platelets 219; Potassium 4.2; Sodium 134   Other Studies Reviewed  Today:  Echocardiogram 02/09/2020: :1. Left ventricular ejection fraction, by estimation, is 55%. The left  ventricle has normal function. The left ventricle has no regional wall  motion abnormalities. Left ventricular diastolic parameters were normal.  2. Right ventricular systolic function is normal. The right ventricular  size is normal. There is normal pulmonary artery systolic pressure. The  estimated right ventricular systolic pressure is 75.4 mmHg.  3. The mitral valve is grossly normal. No evidence of mitral valve  regurgitation.  4. The aortic valve is tricuspid. Aortic valve regurgitation is not  visualized.  5. The inferior vena cava is normal in size with greater than 50%  respiratory variability, suggesting right atrial pressure of 3 mmHg.   Assessment and Plan:  History of autonomic dysfunction and suspected POTS, also paroxysmal palpitations but never any definitive SVT by previous cardiac monitoring.  She is now back on low-dose Toprol-XL after discussion with her OB, could potentially uptitrate to 12.5 mg once in the evening.  Continue with hydration and compression stockings.  I wonder if her symptoms might settle down some after she delivers.  Office follow-up arranged.  Medication Adjustments/Labs and Tests Ordered: Current medicines are reviewed at length with the patient today.  Concerns regarding medicines are outlined above.   Tests Ordered: No orders of the defined types were placed in this encounter.   Medication Changes: No orders of the defined types were placed in this encounter.   Disposition:  Follow up 3 months in the [place] office.  Signed, Satira Sark, MD, [facility] 01/11/2021 2:32 PM    [facility] at [facility] [address], [place], [place] [address] Phone: [phone]; Fax: [phone]

## 2021-01-11 ENCOUNTER — Ambulatory Visit (INDEPENDENT_AMBULATORY_CARE_PROVIDER_SITE_OTHER): Payer: 59 | Admitting: Cardiology

## 2021-01-11 ENCOUNTER — Encounter: Payer: Self-pay | Admitting: Cardiology

## 2021-01-11 ENCOUNTER — Other Ambulatory Visit: Payer: Self-pay

## 2021-01-11 VITALS — BP 106/68 | HR 90 | Ht 65.0 in | Wt 185.0 lb

## 2021-01-11 DIAGNOSIS — I498 Other specified cardiac arrhythmias: Secondary | ICD-10-CM

## 2021-01-11 DIAGNOSIS — G90A Postural orthostatic tachycardia syndrome (POTS): Secondary | ICD-10-CM

## 2021-01-11 NOTE — Patient Instructions (Signed)
Medication Instructions:  Your physician recommends that you continue on your current medications as directed. Please refer to the Current Medication list given to you today.  *If you need a refill on your cardiac medications before your next appointment, please call your pharmacy*   Lab Work: None today If you have labs (blood work) drawn today and your tests are completely normal, you will receive your results only by: . MyChart Message (if you have MyChart) OR . A paper copy in the mail If you have any lab test that is abnormal or we need to change your treatment, we will call you to review the results.   Testing/Procedures: None today   Follow-Up: At [facility], you and your health needs are our priority.  As part of our continuing mission to provide you with exceptional heart care, we have created designated Provider Care Teams.  These Care Teams include your primary Cardiologist (physician) and Advanced Practice Providers (APPs -  Physician Assistants and Nurse Practitioners) who all work together to provide you with the care you need, when you need it.  We recommend signing up for the patient portal called "MyChart".  Sign up information is provided on this After Visit Summary.  MyChart is used to connect with patients for Virtual Visits (Telemedicine).  Patients are able to view lab/test results, encounter notes, upcoming appointments, etc.  Non-urgent messages can be sent to your provider as well.   To learn more about what you can do with MyChart, go to [email or web address].    Your next appointment:   3 month(s)  The format for your next appointment:   In Person  Provider:   Samuel McDowell, MD   Other Instructions None      Thank you for choosing [facility] !         

## 2021-01-13 ENCOUNTER — Ambulatory Visit (INDEPENDENT_AMBULATORY_CARE_PROVIDER_SITE_OTHER): Payer: 59 | Admitting: Obstetrics and Gynecology

## 2021-01-13 ENCOUNTER — Encounter: Payer: Self-pay | Admitting: Obstetrics and Gynecology

## 2021-01-13 ENCOUNTER — Other Ambulatory Visit: Payer: Self-pay

## 2021-01-13 VITALS — BP 99/62 | HR 77 | Wt 187.0 lb

## 2021-01-13 DIAGNOSIS — I498 Other specified cardiac arrhythmias: Secondary | ICD-10-CM

## 2021-01-13 DIAGNOSIS — G90A Postural orthostatic tachycardia syndrome (POTS): Secondary | ICD-10-CM

## 2021-01-13 DIAGNOSIS — Z9884 Bariatric surgery status: Secondary | ICD-10-CM

## 2021-01-13 DIAGNOSIS — Z3403 Encounter for supervision of normal first pregnancy, third trimester: Secondary | ICD-10-CM

## 2021-01-13 NOTE — Patient Instructions (Signed)
[email or web address]">  Third Trimester of Pregnancy  The third trimester of pregnancy is from week 28 through week 40. This is months 7 through 9. The third trimester is a time when the unborn baby (fetus) is growing rapidly. At the end of the ninth month, the fetus is about 20 inches long and weighs 6-10 pounds. Body changes during your third trimester During the third trimester, your body will continue to go through many changes. The changes vary and generally return to normal after your baby is born. Physical changes  Your weight will continue to increase. You can expect to gain 25-35 pounds (11-16 kg) by the end of the pregnancy if you begin pregnancy at a normal weight. If you are underweight, you can expect to gain 28-40 lb (about 13-18 kg), and if you are overweight, you can expect to gain 15-25 lb (about 7-11 kg).  You may begin to get stretch marks on your hips, abdomen, and breasts.  Your breasts will continue to grow and may hurt. A yellow fluid (colostrum) may leak from your breasts. This is the first milk you are producing for your baby.  You may have changes in your hair. These can include thickening of your hair, rapid growth, and changes in texture. Some people also have hair loss during or after pregnancy, or hair that feels dry or thin.  Your belly button may stick out.  You may notice more swelling in your hands, face, or ankles. Health changes  You may have heartburn.  You may have constipation.  You may develop hemorrhoids.  You may develop swollen, bulging veins (varicose veins) in your legs.  You may have increased body aches in the pelvis, back, or thighs. This is due to weight gain and increased hormones that are relaxing your joints.  You may have increased tingling or numbness in your hands, arms, and legs. The skin on your abdomen may also feel numb.  You may feel short of breath because of your  expanding uterus. Other changes  You may urinate more often because the fetus is moving lower into your pelvis and pressing on your bladder.  You may have more problems sleeping. This may be caused by the size of your abdomen, an increased need to urinate, and an increase in your body's metabolism.  You may notice the fetus "dropping," or moving lower in your abdomen (lightening).  You may have increased vaginal discharge.  You may notice that you have pain around your pelvic bone as your uterus distends. Follow these instructions at home: Medicines  Follow your health care provider's instructions regarding medicine use. Specific medicines may be either safe or unsafe to take during pregnancy. Do not take any medicines unless approved by your health care provider.  Take a prenatal vitamin that contains at least 600 micrograms (mcg) of folic acid. Eating and drinking  Eat a healthy diet that includes fresh fruits and vegetables, whole grains, good sources of protein such as meat, eggs, or tofu, and low-fat dairy products.  Avoid raw meat and unpasteurized juice, milk, and cheese. These carry germs that can harm you and your baby.  Eat 4 or 5 small meals rather than 3 large meals a day.  You may need to take these actions to prevent or treat constipation: ? Drink enough fluid to keep your urine pale yellow. ? Eat foods that are high in fiber, such as beans, whole grains, and fresh fruits and vegetables. ? Limit foods that are high in fat and  processed sugars, such as fried or sweet foods. Activity  Exercise only as directed by your health care provider. Most people can continue their usual exercise routine during pregnancy. Try to exercise for 30 minutes at least 5 days a week. Stop exercising if you experience contractions in the uterus.  Stop exercising if you develop pain or cramping in the lower abdomen or lower back.  Avoid heavy lifting.  Do not exercise if it is very hot or  humid or if you are at a high altitude.  If you choose to, you may continue to have sex unless your health care provider tells you not to. Relieving pain and discomfort  Take frequent breaks and rest with your legs raised (elevated) if you have leg cramps or low back pain.  Take warm sitz baths to soothe any pain or discomfort caused by hemorrhoids. Use hemorrhoid cream if your health care provider approves.  Wear a supportive bra to prevent discomfort from breast tenderness.  If you develop varicose veins: ? Wear support hose as told by your health care provider. ? Elevate your feet for 15 minutes, 3-4 times a day. ? Limit salt in your diet. Safety  Talk to your health care provider before traveling far distances.  Do not use hot tubs, steam rooms, or saunas.  Wear your seat belt at all times when driving or riding in a car.  Talk with your health care provider if someone is verbally or physically abusive to you. Preparing for birth To prepare for the arrival of your baby:  Take prenatal classes to understand, practice, and ask questions about labor and delivery.  Visit the hospital and tour the maternity area.  Purchase a rear-facing car seat and make sure you know how to install it in your car.  Prepare the baby's room or sleeping area. Make sure to remove all pillows and stuffed animals from the baby's crib to prevent suffocation. General instructions  Avoid cat litter boxes and soil used by cats. These carry germs that can cause birth defects in the baby. If you have a cat, ask someone to clean the litter box for you.  Do not douche or use tampons. Do not use scented sanitary pads.  Do not use any products that contain nicotine or tobacco, such as cigarettes, e-cigarettes, and chewing tobacco. If you need help quitting, ask your health care provider.  Do not use any herbal remedies, illegal drugs, or medicines that were not prescribed to you. Chemicals in these products  can harm your baby.  Do not drink alcohol.  You will have more frequent prenatal exams during the third trimester. During a routine prenatal visit, your health care provider will do a physical exam, perform tests, and discuss your overall health. Keep all follow-up visits. This is important. Where to find more information  American Pregnancy Association: americanpregnancy.[place] and Gynecologists: PoolDevices.com.pt  Office on [employer]: KeywordPortfolios.com.br Contact a health care provider if you have:  A fever.  Mild pelvic cramps, pelvic pressure, or nagging pain in your abdominal area or lower back.  Vomiting or diarrhea.  Bad-smelling vaginal discharge or foul-smelling urine.  Pain when you urinate.  A headache that does not go away when you take medicine.  Visual changes or see spots in front of your eyes. Get help right away if:  Your water breaks.  You have regular contractions less than 5 minutes apart.  You have spotting or bleeding from your vagina.  You  have severe abdominal pain.  You have difficulty breathing.  You have chest pain.  You have fainting spells.  You have not felt your baby move for the time period told by your health care provider.  You have new or increased pain, swelling, or redness in an arm or leg. Summary  The third trimester of pregnancy is from week 28 through week 40 (months 7 through 9).  You may have more problems sleeping. This can be caused by the size of your abdomen, an increased need to urinate, and an increase in your body's metabolism.  You will have more frequent prenatal exams during the third trimester. Keep all follow-up visits. This is important. This information is not intended to replace advice given to you by your health care provider. Make sure you discuss any questions you have with your health care provider. Document Revised: 05/18/2020 Document  Reviewed: 03/24/2020 Elsevier Patient Education  2021 [employer].

## 2021-01-13 NOTE — Progress Notes (Signed)
Subjective:  Vickie Sutton is a 20 y.o. G1P0 at [redacted]w[redacted]d being seen today for ongoing prenatal care.  She is currently monitored for the following issues for this low-risk pregnancy and has POTS (postural orthostatic tachycardia syndrome); Syncope; Asthma; Supervision of normal first pregnancy; History of Roux-en-Y gastric bypass; PCOS (polycystic ovarian syndrome); and Anxiety on their problem list.  Patient reports general discomforts of pregnancy.  Contractions: Irritability. Vag. Bleeding: None.  Movement: Present. Denies leaking of fluid.   The following portions of the patient's history were reviewed and updated as appropriate: allergies, current medications, past family history, past medical history, past social history, past surgical history and problem list. Problem list updated.  Objective:   Vitals:   01/13/21 0839  BP: 99/62  Pulse: 77  Weight: 187 lb (84.8 kg)    Fetal Status:     Movement: Present     General:  Alert, oriented and cooperative. Patient is in no acute distress.  Skin: Skin is warm and dry. No rash noted.   Cardiovascular: Normal heart rate noted  Respiratory: Normal respiratory effort, no problems with respiration noted  Abdomen: Soft, gravid, appropriate for gestational age. Pain/Pressure: Present     Pelvic:  Cervical exam deferred        Extremities: Normal range of motion.  Edema: Trace  Mental Status: Normal mood and affect. Normal behavior. Normal judgment and thought content.   Urinalysis:      Assessment and Plan:  Pregnancy: G1P0 at [redacted]w[redacted]d  1. Encounter for supervision of normal first pregnancy in third trimester Stable Vaginal cultures next visit  2. History of Roux-en-Y gastric bypass Growth scan next visit  3. POTS (postural orthostatic tachycardia syndrome) Stable Continue with current management  Preterm labor symptoms and general obstetric precautions including but not limited to vaginal bleeding, contractions, leaking of fluid  and fetal movement were reviewed in detail with the patient. Please refer to After Visit Summary for other counseling recommendations.  Return in about 1 week (around 01/20/2021) for OB visit, face to face, any provider, growth scan .   Chancy Milroy, MD

## 2021-01-16 DIAGNOSIS — Z3482 Encounter for supervision of other normal pregnancy, second trimester: Secondary | ICD-10-CM | POA: Diagnosis not present

## 2021-01-16 DIAGNOSIS — Z3483 Encounter for supervision of other normal pregnancy, third trimester: Secondary | ICD-10-CM | POA: Diagnosis not present

## 2021-01-17 ENCOUNTER — Other Ambulatory Visit: Payer: Self-pay | Admitting: Obstetrics and Gynecology

## 2021-01-17 DIAGNOSIS — Z9884 Bariatric surgery status: Secondary | ICD-10-CM

## 2021-01-18 ENCOUNTER — Other Ambulatory Visit (HOSPITAL_COMMUNITY)
Admission: RE | Admit: 2021-01-18 | Discharge: 2021-01-18 | Disposition: A | Discharge status: Home / Self Care | Payer: 59 | Source: Ambulatory Visit | Attending: Obstetrics and Gynecology | Admitting: Obstetrics and Gynecology

## 2021-01-18 ENCOUNTER — Other Ambulatory Visit: Payer: Self-pay

## 2021-01-18 ENCOUNTER — Ambulatory Visit (INDEPENDENT_AMBULATORY_CARE_PROVIDER_SITE_OTHER): Payer: 59 | Admitting: Obstetrics and Gynecology

## 2021-01-18 ENCOUNTER — Encounter: Payer: Self-pay | Admitting: Obstetrics and Gynecology

## 2021-01-18 ENCOUNTER — Ambulatory Visit (INDEPENDENT_AMBULATORY_CARE_PROVIDER_SITE_OTHER): Payer: 59

## 2021-01-18 VITALS — BP 107/67 | HR 72 | Wt 187.0 lb

## 2021-01-18 DIAGNOSIS — Z3402 Encounter for supervision of normal first pregnancy, second trimester: Secondary | ICD-10-CM

## 2021-01-18 DIAGNOSIS — Z3403 Encounter for supervision of normal first pregnancy, third trimester: Secondary | ICD-10-CM | POA: Diagnosis not present

## 2021-01-18 DIAGNOSIS — Z3A36 36 weeks gestation of pregnancy: Secondary | ICD-10-CM | POA: Insufficient documentation

## 2021-01-18 DIAGNOSIS — Z9884 Bariatric surgery status: Secondary | ICD-10-CM | POA: Diagnosis not present

## 2021-01-18 NOTE — Patient Instructions (Signed)

## 2021-01-18 NOTE — Progress Notes (Signed)
[place] 14+9 wks,cephalic,anterior placenta gr 3,afi 15 cm,fhr 171 bpm,EFW 2634 g 29%

## 2021-01-18 NOTE — Progress Notes (Signed)
   LOW-RISK PREGNANCY OFFICE VISIT Patient name: Vickie Sutton MRN [id number]  Date of birth: [birth year] Chief Complaint:   Routine Prenatal Visit ([place] today)  History of Present Illness:   Vickie Sutton is a 20 y.o. G1P0 female at [redacted]w[redacted]d with an Estimated Date of Delivery: 02/14/21 being seen today for ongoing management of a low-risk pregnancy.  Today she reports no complaints. Contractions: Not present. Vag. Bleeding: None.  Movement: Present. denies leaking of fluid. Review of Systems:   Pertinent items are noted in HPI Denies abnormal vaginal discharge w/ itching/odor/irritation, headaches, visual changes, shortness of breath, chest pain, abdominal pain, severe nausea/vomiting, or problems with urination or bowel movements unless otherwise stated above. Pertinent History Reviewed:  Reviewed past medical,surgical, social, obstetrical and family history.  Reviewed problem list, medications and allergies. Physical Assessment:   Vitals:   01/18/21 1110  BP: 107/67  Pulse: 72  Weight: 187 lb (84.8 kg)  Body mass index is 31.12 kg/m.        Physical Examination:   General appearance: Well appearing, and in no distress  Mental status: Alert, oriented to person, place, and time  Skin: Warm & dry  Cardiovascular: Normal heart rate noted  Respiratory: Normal respiratory effort, no distress  Abdomen: Soft, gravid, nontender  Pelvic: Cervical exam performed  Dilation: 1.5 Effacement (%): 80 Station: 0  Extremities: Edema: None  Fetal Status: Fetal Heart Rate (bpm): 123 Fundal Height: 36 cm Movement: Present Presentation: Vertex  F/U U/S:  [place] 25+9 wks,cephalic,anterior placenta gr 3,afi 15 cm,fhr 171 bpm,EFW 2634 g 29%   Assessment & Plan:  1) Low-risk pregnancy G1P0 at [redacted]w[redacted]d with an Estimated Date of Delivery: 02/14/21   2) Encounter for supervision of normal first pregnancy in third trimester  - Cervicovaginal ancillary only( [place]),  - Culture, beta strep (group b  only)  3) [redacted] weeks gestation of pregnancy    Meds: No orders of the defined types were placed in this encounter.  Labs/procedures today: F/U U/S  Plan:  Continue routine obstetrical care   Reviewed: Preterm labor symptoms and general obstetric precautions including but not limited to vaginal bleeding, contractions, leaking of fluid and fetal movement were reviewed in detail with the patient.  All questions were answered. Has home bp cuff. Check bp weekly, let us know if >140/90.   Follow-up: Return in about 2 weeks (around 02/01/2021) for Return OB visit.  Orders Placed This Encounter  Procedures  . Culture, beta strep (group b only)   Laury Deep MSN, CNM 01/18/2021 11:40 AM

## 2021-01-19 LAB — CERVICOVAGINAL ANCILLARY ONLY
Chlamydia: NEGATIVE
Comment: NEGATIVE
Comment: NORMAL
Neisseria Gonorrhea: NEGATIVE

## 2021-01-21 ENCOUNTER — Other Ambulatory Visit: Payer: Self-pay

## 2021-01-21 ENCOUNTER — Encounter (HOSPITAL_COMMUNITY): Payer: Self-pay

## 2021-01-21 ENCOUNTER — Ambulatory Visit (HOSPITAL_COMMUNITY)
Admission: EM | Admit: 2021-01-21 | Discharge: 2021-01-21 | Disposition: A | Discharge status: Home / Self Care | Payer: 59

## 2021-01-21 DIAGNOSIS — U071 COVID-19: Secondary | ICD-10-CM | POA: Diagnosis not present

## 2021-01-21 DIAGNOSIS — O98513 Other viral diseases complicating pregnancy, third trimester: Secondary | ICD-10-CM | POA: Diagnosis not present

## 2021-01-21 NOTE — ED Triage Notes (Signed)
Pt reports nasal congestion, sore throat, headaches X 4 days. Pt states she has had loss of appetite x 2 days. She states during the night she is SOB. Pt denies chest pain. Pt states she tested positive for COVID yesterday.

## 2021-01-21 NOTE — ED Provider Notes (Signed)
[facility]    CSN: [id number] Arrival date & time: 01/21/21  1209      History   Chief Complaint Chief Complaint  Patient presents with  . Nasal Congestion  . Sore Throat  . Headache  . Shortness of Breath    HPI Vickie Sutton is a 20 y.o. female.   HPI   COVID-19: PT presents with a 4 day history of nasal congestion, sore throat, headache.  She has had mild loss of appetite x2 days and has noticed that at night she does feel a bit short of breath when she rests on her side.  She reports no short of breath during the day or with activity.  She denies any chest pain, fevers, nausea, vomiting, diarrhea or loss of taste or smell. NO change in fetal movements noted. She has tried rotating from side to side at night to help with her shortness of breath but has not had significant relief of this.  She questions if this is related to being [redacted] weeks pregnant or her history of POTS.  Of note she is fully vaccinated.  Past Medical History:  Diagnosis Date  . ADD (attention deficit disorder)   . Anxiety   . Asthma   . Migraine   . Panic attacks   . Polycystic ovarian syndrome   . POTS (postural orthostatic tachycardia syndrome)     Patient Active Problem List   Diagnosis Date Noted  . Supervision of normal first pregnancy 08/08/2020  . History of Roux-en-Y gastric bypass 08/08/2020  . PCOS (polycystic ovarian syndrome) 08/08/2020  . Anxiety 08/08/2020  . Syncope 07/25/2020  . POTS (postural orthostatic tachycardia syndrome) 05/03/2020  . Asthma 03/23/2019    Past Surgical History:  Procedure Laterality Date  . ESOPHAGOGASTRODUODENOSCOPY    . LAPAROTOMY N/A 09/07/2020   Procedure: EXPLORATORY LAPAROTOMY WITH EXTRACTION  OVARIAN CYST;  Surgeon: Florian Buff, MD;  Location: [facility];  Service: Gynecology;  Laterality: N/A;  . ROUX-EN-Y GASTRIC BYPASS     August 2020 - [facility]  . TOOTH EXTRACTION N/A 12/12/2015   Procedure: EXTRACTION MOLARS - teeth one, sixteen,  seventeen and thirty-two;  Surgeon: Diona Browner, DDS;  Location: [facility];  Service: Oral Surgery;  Laterality: N/A;    OB History    Gravida  1   Para      Term      Preterm      AB      Living        SAB      IAB      Ectopic      Multiple      Live Births               Home Medications    Prior to Admission medications   Medication Sig Start Date End Date Taking? Authorizing Provider  FOLIC ACID PO Take 1 tablet by mouth daily.   Yes [provider]  Magnesium Oxide (MAG-OXIDE) 200 MG TABS Take 2 tablets (400 mg total) by mouth at bedtime. If that amount causes loose stools in the am, switch to 200mg  daily at bedtime. 12/14/20  Yes Gaylan Gerold R, CNM  metoprolol succinate (TOPROL-XL) 25 MG 24 hr tablet Take 0.5 tablets (12.5 mg total) by mouth at bedtime. 11/28/20  Yes Truett Mainland, DO  Prenatal Vit-Fe Fumarate-FA (PRENATAL MULTIVITAMIN) TABS tablet Take 1 tablet by mouth daily.    Yes [provider]  albuterol (VENTOLIN HFA) 108 (90 Base)  MCG/ACT inhaler Inhale 2 puffs into the lungs 4 (four) times daily as needed for wheezing or shortness of breath.     [provider]  Cyanocobalamin (VITAMIN B12 PO) Take 1 tablet by mouth daily.     [provider]  Docusate Sodium (STOOL SOFTENER LAXATIVE PO) Take 1 tablet by mouth daily.  Patient not taking: Reported on 01/18/2021    [provider]  Elastic Bandages & Supports (COMFORT FIT MATERNITY SUPP SM) MISC 1 Units by Does not apply route daily as needed. 12/14/20   Gabriel Carina, CNM  Ferrous Sulfate (IRON PO) Take 1 tablet by mouth daily.    [provider]  Polyethylene Glycol 3350 (MIRALAX PO) Take 17 g by mouth daily as needed (constipation).  Patient not taking: Reported on 01/18/2021    [provider]  valACYclovir (VALTREX) 1000 MG tablet Take 4,000 mg by mouth once. Patient not taking: Reported on 01/18/2021 10/25/20   [provider]    Family History Family History  Problem Relation Age of Onset  . Obesity Mother   . Diabetes Mellitus II Maternal Grandmother   . Heart disease Maternal Grandmother   . Yves Dill Parkinson White syndrome Brother   . Seizures Maternal Aunt     Social History Social History   Tobacco Use  . Smoking status: Never Smoker  . Smokeless tobacco: Never Used  Vaping Use  . Vaping Use: Never used  Substance Use Topics  . Alcohol use: No  . Drug use: No     Allergies   Patient has no known allergies.   Review of Systems Review of Systems  As stated above in HPI  Physical Exam Triage Vital Signs ED Triage Vitals  Enc Vitals Group     BP 01/21/21 1220 111/67     Pulse Rate 01/21/21 1220 73     Resp 01/21/21 1220 16     Temp 01/21/21 1220 97.9 F (36.6 C)     Temp Source 01/21/21 1220 Oral     SpO2 01/21/21 1220 100 %     Weight --      Height --      Head Circumference --      Peak Flow --      Pain Score 01/21/21 1218 0     Pain Loc --      Pain Edu? --      Excl. in [place]? --    No data found.  Updated Vital Signs BP 111/67 (BP Location: Left Arm)   Pulse 73   Temp 97.9 F (36.6 C) (Oral)   Resp 16   SpO2 100%   Physical Exam Vitals and nursing note reviewed.  Constitutional:      General: She is not in acute distress.    Appearance: She is not ill-appearing, toxic-appearing or diaphoretic.  HENT:     Head: Normocephalic.     Right Ear: Tympanic membrane normal.     Left Ear: Tympanic membrane normal.     Nose: Congestion present. No rhinorrhea.     Mouth/Throat:     Mouth: Mucous membranes are moist.     Pharynx: No pharyngeal swelling, oropharyngeal exudate, posterior oropharyngeal erythema or uvula swelling.     Tonsils: No tonsillar exudate or tonsillar abscesses.  Eyes:     Conjunctiva/sclera: Conjunctivae normal.     Pupils: Pupils are equal, round, and reactive to light.  Cardiovascular:     Rate and Rhythm: Normal rate and regular rhythm.  Heart sounds: Normal heart sounds. No murmur heard. No friction rub. No gallop.   Pulmonary:     Effort: Pulmonary effort is normal. No respiratory distress.     Breath sounds: Normal breath sounds. No stridor. No wheezing, rhonchi or rales.  Chest:     Chest wall: No tenderness.  Musculoskeletal:     Cervical back: Neck supple.  Lymphadenopathy:     Cervical: No cervical adenopathy.     UC Treatments / Results  Labs (all labs ordered are listed, but only abnormal results are displayed) Labs Reviewed - No data to display  Radiology No results found.  Procedures Procedures (including critical care time)  Medications Ordered in UC Medications - No data to display  Initial Impression / Assessment and Plan / UC Course  I have reviewed the triage vital signs and the nursing notes.  Pertinent labs & imaging results that were available during my care of the patient were reviewed by me and considered in my medical decision making (see chart for details).     New.  Given her physical exam findings and vitals this is extremely reassuring which I discussed with patient.  I would recommend that she uses a wedge pillow at night to help prop herself up which may offer her some relief.  I have also recommended that she monitor her oxygen at home with guidance of goals for home oxygen to be 94% or higher.  We discussed red flag symptoms that would warrant further follow-up and medical attention.   Final Clinical Impressions(s) / UC Diagnoses   Final diagnoses:  None   Discharge Instructions   None    ED Prescriptions    None     PDMP not reviewed this encounter.   Hughie Closs, [place] 01/21/21 1255

## 2021-01-22 LAB — CULTURE, BETA STREP (GROUP B ONLY): Strep Gp B Culture: NEGATIVE

## 2021-01-25 DIAGNOSIS — Z20828 Contact with and (suspected) exposure to other viral communicable diseases: Secondary | ICD-10-CM | POA: Diagnosis not present

## 2021-02-01 ENCOUNTER — Ambulatory Visit (INDEPENDENT_AMBULATORY_CARE_PROVIDER_SITE_OTHER): Payer: 59 | Admitting: Advanced Practice Midwife

## 2021-02-01 ENCOUNTER — Other Ambulatory Visit: Payer: Self-pay

## 2021-02-01 VITALS — BP 114/64 | HR 76 | Wt 192.0 lb

## 2021-02-01 DIAGNOSIS — Z3A38 38 weeks gestation of pregnancy: Secondary | ICD-10-CM

## 2021-02-01 DIAGNOSIS — Z3403 Encounter for supervision of normal first pregnancy, third trimester: Secondary | ICD-10-CM

## 2021-02-01 NOTE — Patient Instructions (Signed)

## 2021-02-01 NOTE — Progress Notes (Signed)
   LOW-RISK PREGNANCY VISIT Patient name: Vickie Sutton MRN [id number]  Date of birth: 08-22-2001 Chief Complaint:   Routine Prenatal Visit  History of Present Illness:   Vickie Sutton is a 20 y.o. G65P0 female at [redacted]w[redacted]d with an Estimated Date of Delivery: 02/14/21 being seen today for ongoing management of a low-risk pregnancy.  Today she reports low backache starting yesterday; was able to fall asleep with it; no dysuria; is considering starting FMLA- discussed that it's a personal choice. Contractions: Irregular.  .  Movement: Present. denies leaking of fluid. Review of Systems:   Pertinent items are noted in HPI Denies abnormal vaginal discharge w/ itching/odor/irritation, headaches, visual changes, shortness of breath, chest pain, abdominal pain, severe nausea/vomiting, or problems with urination or bowel movements unless otherwise stated above. Pertinent History Reviewed:  Reviewed past medical,surgical, social, obstetrical and family history.  Reviewed problem list, medications and allergies. Physical Assessment:   Vitals:   02/01/21 1110  BP: 114/64  Pulse: 76  Weight: 192 lb (87.1 kg)  Body mass index is 31.95 kg/m.        Physical Examination:   General appearance: Well appearing, and in no distress  Mental status: Alert, oriented to person, place, and time  Skin: Warm & dry  Cardiovascular: Normal heart rate noted  Respiratory: Normal respiratory effort, no distress  Abdomen: Soft, gravid, nontender  Pelvic: Cervical exam performed  Dilation: 1 Effacement (%): 60 Station: -1  Extremities: Edema: None  Fetal Status: Fetal Heart Rate (bpm): 145   Movement: Present Presentation: Vertex  No results found for this or any previous visit (from the past 24 hour(s)).  Assessment & Plan:  1) Low-risk pregnancy G1P0 at [redacted]w[redacted]d with an Estimated Date of Delivery: 02/14/21   2) Hx gastric bypass, EFW @ 36wks was 29th%  3) POTS, taking Toprol  4) Back discomfort, can try  heating pad   Meds: No orders of the defined types were placed in this encounter.  Labs/procedures today: SVE  Plan:  Continue routine obstetrical care   Reviewed: Term labor symptoms and general obstetric precautions including but not limited to vaginal bleeding, contractions, leaking of fluid and fetal movement were reviewed in detail with the patient.  All questions were answered. Has home bp cuff. Check bp weekly, let us know if >140/90.   Follow-up: Return in about 1 week (around 02/08/2021) for [place].  No orders of the defined types were placed in this encounter.  Myrtis Ser CNM 02/01/2021 12:55 PM

## 2021-02-09 ENCOUNTER — Encounter (HOSPITAL_COMMUNITY): Payer: Self-pay | Admitting: Obstetrics and Gynecology

## 2021-02-09 ENCOUNTER — Encounter (HOSPITAL_COMMUNITY): Payer: Self-pay | Admitting: Family Medicine

## 2021-02-09 ENCOUNTER — Inpatient Hospital Stay (HOSPITAL_COMMUNITY): Payer: 59 | Admitting: Anesthesiology

## 2021-02-09 ENCOUNTER — Other Ambulatory Visit: Payer: Self-pay

## 2021-02-09 ENCOUNTER — Inpatient Hospital Stay (EMERGENCY_DEPARTMENT_HOSPITAL)
Admission: AD | Admit: 2021-02-09 | Discharge: 2021-02-09 | Disposition: A | Discharge status: Home / Self Care | Payer: 59 | Source: Home / Self Care | Attending: Obstetrics and Gynecology | Admitting: Obstetrics and Gynecology

## 2021-02-09 ENCOUNTER — Inpatient Hospital Stay (HOSPITAL_COMMUNITY)
Admission: AD | Admit: 2021-02-09 | Discharge: 2021-02-12 | DRG: 805 | Disposition: A | Discharge status: Home / Self Care | Payer: 59 | Attending: Family Medicine | Admitting: Family Medicine

## 2021-02-09 ENCOUNTER — Ambulatory Visit (INDEPENDENT_AMBULATORY_CARE_PROVIDER_SITE_OTHER): Payer: 59 | Admitting: Women's Health

## 2021-02-09 ENCOUNTER — Encounter: Payer: Self-pay | Admitting: Women's Health

## 2021-02-09 VITALS — BP 126/74 | HR 77 | Wt 198.0 lb

## 2021-02-09 DIAGNOSIS — O9952 Diseases of the respiratory system complicating childbirth: Secondary | ICD-10-CM | POA: Diagnosis not present

## 2021-02-09 DIAGNOSIS — O9942 Diseases of the circulatory system complicating childbirth: Secondary | ICD-10-CM | POA: Diagnosis present

## 2021-02-09 DIAGNOSIS — Z3A39 39 weeks gestation of pregnancy: Secondary | ICD-10-CM

## 2021-02-09 DIAGNOSIS — Z3483 Encounter for supervision of other normal pregnancy, third trimester: Secondary | ICD-10-CM

## 2021-02-09 DIAGNOSIS — Z880 Allergy status to penicillin: Secondary | ICD-10-CM

## 2021-02-09 DIAGNOSIS — O368130 Decreased fetal movements, third trimester, not applicable or unspecified: Secondary | ICD-10-CM

## 2021-02-09 DIAGNOSIS — F419 Anxiety disorder, unspecified: Secondary | ICD-10-CM | POA: Diagnosis present

## 2021-02-09 DIAGNOSIS — J45909 Unspecified asthma, uncomplicated: Secondary | ICD-10-CM | POA: Diagnosis not present

## 2021-02-09 DIAGNOSIS — Z3689 Encounter for other specified antenatal screening: Secondary | ICD-10-CM

## 2021-02-09 DIAGNOSIS — I498 Other specified cardiac arrhythmias: Secondary | ICD-10-CM | POA: Diagnosis not present

## 2021-02-09 DIAGNOSIS — O99844 Bariatric surgery status complicating childbirth: Secondary | ICD-10-CM | POA: Diagnosis not present

## 2021-02-09 DIAGNOSIS — O479 False labor, unspecified: Secondary | ICD-10-CM

## 2021-02-09 DIAGNOSIS — G90A Postural orthostatic tachycardia syndrome (POTS): Secondary | ICD-10-CM | POA: Diagnosis present

## 2021-02-09 DIAGNOSIS — Z9884 Bariatric surgery status: Secondary | ICD-10-CM

## 2021-02-09 DIAGNOSIS — Z3403 Encounter for supervision of normal first pregnancy, third trimester: Secondary | ICD-10-CM

## 2021-02-09 HISTORY — DX: Depression, unspecified: F32.A

## 2021-02-09 LAB — CBC
HCT: 36.4 % (ref 36.0–46.0)
Hemoglobin: 12 g/dL (ref 12.0–15.0)
MCH: 28.7 pg (ref 26.0–34.0)
MCHC: 33 g/dL (ref 30.0–36.0)
MCV: 87.1 fL (ref 80.0–100.0)
Platelets: 237 10*3/uL (ref 150–400)
RBC: 4.18 MIL/uL (ref 3.87–5.11)
RDW: 14.4 % (ref 11.5–15.5)
WBC: 12.6 10*3/uL — ABNORMAL HIGH (ref 4.0–10.5)
nRBC: 0 % (ref 0.0–0.2)

## 2021-02-09 LAB — TYPE AND SCREEN
ABO/RH(D): B POS
Antibody Screen: NEGATIVE

## 2021-02-09 MED ORDER — FLEET ENEMA 7-19 GM/118ML RE ENEM: 1.0000 | ENEMA | RECTAL | Status: DC | PRN

## 2021-02-09 MED ORDER — LACTATED RINGERS IV SOLN: 500.0000 mL | INTRAVENOUS | Status: DC | PRN

## 2021-02-09 MED ORDER — SOD CITRATE-CITRIC ACID 500-334 MG/5ML PO SOLN: 30.0000 mL | ORAL | Status: DC | PRN

## 2021-02-09 MED ORDER — PHENYLEPHRINE 40 MCG/ML (10ML) SYRINGE FOR IV PUSH (FOR BLOOD PRESSURE SUPPORT)
PREFILLED_SYRINGE | INTRAVENOUS | Status: AC
  Filled 2021-02-09: qty 10

## 2021-02-09 MED ORDER — FENTANYL-BUPIVACAINE-NACL 0.5-0.125-0.9 MG/250ML-% EP SOLN
EPIDURAL | Status: DC | PRN
  Administered 2021-02-09: 12 mL/h via EPIDURAL

## 2021-02-09 MED ORDER — FENTANYL-BUPIVACAINE-NACL 0.5-0.125-0.9 MG/250ML-% EP SOLN
EPIDURAL | Status: AC
  Filled 2021-02-09: qty 250

## 2021-02-09 MED ORDER — OXYTOCIN-SODIUM CHLORIDE 30-0.9 UT/500ML-% IV SOLN
2.5000 [IU]/h | INTRAVENOUS | Status: DC
  Administered 2021-02-10: 2.5 [IU]/h via INTRAVENOUS
  Filled 2021-02-09: qty 500

## 2021-02-09 MED ORDER — HYDROXYZINE HCL 50 MG PO TABS: 50.0000 mg | ORAL_TABLET | Freq: Four times a day (QID) | ORAL | Status: DC | PRN

## 2021-02-09 MED ORDER — OXYTOCIN BOLUS FROM INFUSION
333.0000 mL | Freq: Once | INTRAVENOUS | Status: AC
  Administered 2021-02-10: 333 mL via INTRAVENOUS

## 2021-02-09 MED ORDER — OXYCODONE-ACETAMINOPHEN 5-325 MG PO TABS: 2.0000 | ORAL_TABLET | ORAL | Status: DC | PRN

## 2021-02-09 MED ORDER — OXYCODONE-ACETAMINOPHEN 5-325 MG PO TABS: 1.0000 | ORAL_TABLET | ORAL | Status: DC | PRN

## 2021-02-09 MED ORDER — LIDOCAINE HCL (PF) 1 % IJ SOLN
30.0000 mL | INTRAMUSCULAR | Status: AC | PRN
  Administered 2021-02-10: 30 mL via SUBCUTANEOUS
  Filled 2021-02-09: qty 30

## 2021-02-09 MED ORDER — ONDANSETRON HCL 4 MG/2ML IJ SOLN: 4.0000 mg | Freq: Four times a day (QID) | INTRAMUSCULAR | Status: DC | PRN

## 2021-02-09 MED ORDER — LIDOCAINE HCL (PF) 1 % IJ SOLN
INTRAMUSCULAR | Status: DC | PRN
  Administered 2021-02-09: 10 mL via EPIDURAL

## 2021-02-09 MED ORDER — FENTANYL CITRATE (PF) 100 MCG/2ML IJ SOLN: 50.0000 ug | INTRAMUSCULAR | Status: DC | PRN

## 2021-02-09 MED ORDER — LACTATED RINGERS IV SOLN: INTRAVENOUS | Status: DC

## 2021-02-09 MED ORDER — ACETAMINOPHEN 325 MG PO TABS: 650.0000 mg | ORAL_TABLET | ORAL | Status: DC | PRN

## 2021-02-09 NOTE — MAU Note (Signed)
Vickie Sutton is a 20 y.o. at [redacted]w[redacted]d here in MAU reporting: had membranes swept in the office today and has been having more painful contractions since then. Denies LOF but is having bleeding. States she has not been feeling FM.   Onset of complaint: ongoing but worse  Pain score: 6/10  Vitals:   02/09/21 1809  BP: 136/75  Pulse: 63  Resp: 20  Temp: 98.3 F (36.8 C)  SpO2: 100%     FHT: EFM applied in room  Lab orders placed from triage: none

## 2021-02-09 NOTE — Progress Notes (Signed)
   LOW-RISK PREGNANCY VISIT Patient name: Vickie Sutton MRN [id number]  Date of birth: 25-Mar-2001 Chief Complaint:   Routine Prenatal Visit (Noticed some swelling stomach area were had gastric bypass)  History of Present Illness:   Vickie Sutton is a 20 y.o. G1P0 female at [redacted]w[redacted]d with an Estimated Date of Delivery: 02/14/21 being seen today for ongoing management of a low-risk pregnancy.  Depression screen [facility] 2/9 08/08/2020  Decreased Interest 0  Down, Depressed, Hopeless 0  PHQ - 2 Score 0  Altered sleeping 0  Tired, decreased energy 2  Change in appetite 0  Feeling bad or failure about yourself  0  Trouble concentrating 0  Moving slowly or fidgety/restless 0  Suicidal thoughts 0  PHQ-9 Score 2    Today she reports went to mau this am for vb, was 3 then 4cm w/ uc's q 10mins. States they have continued throughout the day, a little more uncomfortable. No vb/lof. Good fm. Some swelling in lower belly. Contractions: Irregular.  .  Movement: Present. denies leaking of fluid. Review of Systems:   Pertinent items are noted in HPI Denies abnormal vaginal discharge w/ itching/odor/irritation, headaches, visual changes, shortness of breath, chest pain, abdominal pain, severe nausea/vomiting, or problems with urination or bowel movements unless otherwise stated above. Pertinent History Reviewed:  Reviewed past medical,surgical, social, obstetrical and family history.  Reviewed problem list, medications and allergies. Physical Assessment:   Vitals:   02/09/21 1346  BP: 126/74  Pulse: 77  Weight: 198 lb (89.8 kg)  Body mass index is 32.95 kg/m.        Physical Examination:   General appearance: Well appearing, and in no distress  Mental status: Alert, oriented to person, place, and time  Skin: Warm & dry  Cardiovascular: Normal heart rate noted  Respiratory: Normal respiratory effort, no distress  Abdomen: Soft, gravid, nontender, dependent edema in lower abd  Pelvic:  Cervical exam performed  Dilation: 5 Effacement (%): 90 Station: -1 4/90/-1, Offered membrane sweeping, discussed r/b- pt decided to proceed, so membranes swept, now 5/90/-1  Extremities: Edema: Mild pitting, slight indentation  Fetal Status: Fetal Heart Rate (bpm): 133 Fundal Height: 37 cm Movement: Present Presentation: Vertex  Chaperone: Angel Neas   No results found for this or any previous visit (from the past 24 hour(s)).  Assessment & Plan:  1) Low-risk pregnancy G1P0 at [redacted]w[redacted]d with an Estimated Date of Delivery: 02/14/21   2) Early labor, membranes swept   Meds: No orders of the defined types were placed in this encounter.  Labs/procedures today: SVE and membrane sweep  Plan:  Continue routine obstetrical care  Next visit: prefers in person    Reviewed: Term labor symptoms and general obstetric precautions including but not limited to vaginal bleeding, contractions, leaking of fluid and fetal movement were reviewed in detail with the patient.  All questions were answered.  Follow-up: Return in about 1 week (around 02/16/2021) for LROB, NST, in person, CNM.  Future Appointments  Date Time Provider [place]  04/21/2021  2:00 PM Satira Sark, MD [facility] [place] [facility]    No orders of the defined types were placed in this encounter.  [place], [facility] 02/09/2021 2:29 PM

## 2021-02-09 NOTE — MAU Note (Signed)
..  Vickie Sutton is a 20 y.o. at [redacted]w[redacted]d here in MAU reporting: no fetal movement all day. Reports some contractions every couple minutes.  Last cervical exam was 1 cm.  Reports some pink mucus like discharge. Denies leaking of fluid.  Vitals:   02/09/21 0212  BP: 127/74  Pulse: (!) 59  Resp: 15  Temp: 98.4 F (36.9 C)  SpO2: 100%

## 2021-02-09 NOTE — Discharge Instructions (Signed)
Rosen's Emergency Medicine: Concepts and Clinical Practice (9th ed., pp. 2296- 2312). Elsevier.">  Braxton Hicks Contractions Contractions of the uterus can occur throughout pregnancy, but they are not always a sign that you are in labor. You may have practice contractions called Braxton Hicks contractions. These false labor contractions are sometimes confused with true labor. What are [place] contractions? Braxton Hicks contractions are tightening movements that occur in the muscles of the uterus before labor. Unlike true labor contractions, these contractions do not result in opening (dilation) and thinning of the cervix. Toward the end of pregnancy (32-34 weeks), Braxton Hicks contractions can happen more often and may become stronger. These contractions are sometimes difficult to tell apart from true labor because they can be very uncomfortable. You should not feel embarrassed if you go to the hospital with false labor. Sometimes, the only way to tell if you are in true labor is for your health care provider to look for changes in the cervix. The health care provider will do a physical exam and may monitor your contractions. If you are not in true labor, the exam should show that your cervix is not dilating and your water has not broken. If there are no other health problems associated with your pregnancy, it is completely safe for you to be sent home with false labor. You may continue to have Braxton Hicks contractions until you go into true labor. How to tell the difference between true labor and false labor True labor  Contractions last 30-70 seconds.  Contractions become very regular.  Discomfort is usually felt in the top of the uterus, and it spreads to the lower abdomen and low back.  Contractions do not go away with walking.  Contractions usually become more intense and increase in frequency.  The cervix dilates and gets thinner. False labor  Contractions are usually shorter  and not as strong as true labor contractions.  Contractions are usually irregular.  Contractions are often felt in the front of the lower abdomen and in the groin.  Contractions may go away when you walk around or change positions while lying down.  Contractions get weaker and are shorter-lasting as time goes on.  The cervix usually does not dilate or become thin. Follow these instructions at home:  Take over-the-counter and prescription medicines only as told by your health care provider.  Keep up with your usual exercises and follow other instructions from your health care provider.  Eat and drink lightly if you think you are going into labor.  If Braxton Hicks contractions are making you uncomfortable: ? Change your position from lying down or resting to walking, or change from walking to resting. ? Sit and rest in a tub of warm water. ? Drink enough fluid to keep your urine pale yellow. Dehydration may cause these contractions. ? Do slow and deep breathing several times an hour.  Keep all follow-up prenatal visits as told by your health care provider. This is important.   Contact a health care provider if:  You have a fever.  You have continuous pain in your abdomen. Get help right away if:  Your contractions become stronger, more regular, and closer together.  You have fluid leaking or gushing from your vagina.  You pass blood-tinged mucus (bloody show).  You have bleeding from your vagina.  You have low back pain that you never had before.  You feel your baby's head pushing down and causing pelvic pressure.  Your baby is not moving inside  you as much as it used to. Summary  Contractions that occur before labor are called Braxton Hicks contractions, false labor, or practice contractions.  Braxton Hicks contractions are usually shorter, weaker, farther apart, and less regular than true labor contractions. True labor contractions usually become progressively  stronger and regular, and they become more frequent.  Manage discomfort from [facility] contractions by changing position, resting in a warm bath, drinking plenty of water, or practicing deep breathing. This information is not intended to replace advice given to you by your health care provider. Make sure you discuss any questions you have with your health care provider. Document Revised: 11/22/2017 Document Reviewed: 04/25/2017 Elsevier Patient Education  [place].  Vaginal Delivery  Vaginal delivery means that you give birth by pushing your baby out of your birth canal (vagina). A team of health care providers will help you before, during, and after vaginal delivery. Birth experiences are unique for every woman and every pregnancy, and birth experiences vary depending on where you choose to give birth. What happens when I arrive at the birth center or hospital? Once you are in labor and have been admitted into the hospital or birth center, your health care provider may:  Review your pregnancy history and any concerns that you have.  Insert an IV into one of your veins. This may be used to give you fluids and medicines.  Check your blood pressure, pulse, temperature, and heart rate (vital signs).  Check whether your bag of water (amniotic sac) has broken (ruptured).  Talk with you about your birth plan and discuss pain control options. Monitoring Your health care provider may monitor your contractions (uterine monitoring) and your baby's heart rate (fetal monitoring). You may need to be monitored:  Often, but not continuously (intermittently).  All the time or for long periods at a time (continuously). Continuous monitoring may be needed if: ? You are taking certain medicines, such as medicine to relieve pain or make your contractions stronger. ? You have pregnancy or labor complications. Monitoring may be done by:  Placing a special stethoscope or a handheld monitoring  device on your abdomen to check your baby's heartbeat and to check for contractions.  Placing monitors on your abdomen (external monitors) to record your baby's heartbeat and the frequency and length of contractions.  Placing monitors inside your uterus through your vagina (internal monitors) to record your baby's heartbeat and the frequency, length, and strength of your contractions. Depending on the type of monitor, it may remain in your uterus or on your baby's head until birth.  Telemetry. This is a type of continuous monitoring that can be done with external or internal monitors. Instead of having to stay in bed, you are able to move around during telemetry. Physical exam Your health care provider may perform frequent physical exams. This may include:  Checking how and where your baby is positioned in your uterus.  Checking your cervix to determine: ? Whether it is thinning out (effacing). ? Whether it is opening up (dilating). What happens during labor and delivery? Normal labor and delivery is divided into the following three stages: Stage 1  This is the longest stage of labor.  This stage can last for hours or days.  Throughout this stage, you will feel contractions. Contractions generally feel mild, infrequent, and irregular at first. They get stronger, more frequent (about every 2-3 minutes), and more regular as you move through this stage.  This stage ends when your cervix is  completely dilated to 4 inches (10 cm) and completely effaced. Stage 2  This stage starts once your cervix is completely effaced and dilated and lasts until the delivery of your baby.  This stage may last from 20 minutes to 2 hours.  This is the stage where you will feel an urge to push your baby out of your vagina.  You may feel stretching and burning pain, especially when the widest part of your baby's head passes through the vaginal opening (crowning).  Once your baby is delivered, the umbilical  cord will be clamped and cut. This usually occurs after waiting a period of 1-2 minutes after delivery.  Your baby will be placed on your bare chest (skin-to-skin contact) in an upright position and covered with a warm blanket. Watch your baby for feeding cues, like rooting or sucking, and help the baby to your breast for his or her first feeding. Stage 3  This stage starts immediately after the birth of your baby and ends after you deliver the placenta.  This stage may take anywhere from 5 to 30 minutes.  After your baby has been delivered, you will feel contractions as your body expels the placenta and your uterus contracts to control bleeding.   What can I expect after labor and delivery?  After labor is over, you and your baby will be monitored closely until you are ready to go home to ensure that you are both healthy. Your health care team will teach you how to care for yourself and your baby.  You and your baby will stay in the same room (rooming in) during your hospital stay. This will encourage early bonding and successful breastfeeding.  You may continue to receive fluids and medicines through an IV.  Your uterus will be checked and massaged regularly (fundal massage).  You will have some soreness and pain in your abdomen, vagina, and the area of skin between your vaginal opening and your anus (perineum).  If an incision was made near your vagina (episiotomy) or if you had some vaginal tearing during delivery, cold compresses may be placed on your episiotomy or your tear. This helps to reduce pain and swelling.  You may be given a squirt bottle to use instead of wiping when you go to the bathroom. To use the squirt bottle, follow these steps: ? Before you urinate, fill the squirt bottle with warm water. Do not use hot water. ? After you urinate, while you are sitting on the toilet, use the squirt bottle to rinse the area around your urethra and vaginal opening. This rinses away any  urine and blood. ? Fill the squirt bottle with clean water every time you use the bathroom.  It is normal to have vaginal bleeding after delivery. Wear a sanitary pad for vaginal bleeding and discharge. Summary  Vaginal delivery means that you will give birth by pushing your baby out of your birth canal (vagina).  Your health care provider may monitor your contractions (uterine monitoring) and your baby's heart rate (fetal monitoring).  Your health care provider may perform a physical exam.  Normal labor and delivery is divided into three stages.  After labor is over, you and your baby will be monitored closely until you are ready to go home. This information is not intended to replace advice given to you by your health care provider. Make sure you discuss any questions you have with your health care provider. Document Revised: 01/14/2018 Document Reviewed: 01/14/2018 Elsevier Patient Education  [place].

## 2021-02-09 NOTE — Progress Notes (Signed)
Vickie Sutton is a 20 y.o. G1P0 admitted at [redacted]w[redacted]d for SOL.  Subjective: Feeling intense contractions.  Desires epidural.  No concerns.   Objective: BP 109/71   Pulse 61   Temp 98.7 F (37.1 C) (Oral)   Resp 18   SpO2 100%  No intake/output data recorded.  FHT:  FHR: 140 bpm, variability: moderate,  accelerations:  Present,  decelerations:  Present one decel, likely early but not tracing contractions well UC:   Not tracing well  SVE:   Dilation: 8 Effacement (%): 100 Station: -1 Exam by:: Grayland Ormond, MD  Labs: Lab Results  Component Value Date   WBC 12.6 (H) 02/09/2021   HGB 12.0 02/09/2021   HCT 36.4 02/09/2021   MCV 87.1 02/09/2021   PLT 237 02/09/2021    Assessment / Plan: Vickie Sutton is a 20 y.o. G1P0 admitted at [redacted]w[redacted]d for SOL.  #Labor: Progressing well without intervention.  Will get epidural then AROM.  #Pain: Will call for epidural now #FWB: Cat I #ID: GBS neg #MOF: Breast #MOC:Unsure #Circ: Yes  [name], MD 02/09/2021, 9:52 PM

## 2021-02-09 NOTE — MAU Provider Note (Addendum)
Chief Complaint:  Decreased Fetal Movement and Contractions   Provider saw patient at 0225   HPI: Vickie Sutton is a 20 y.o. G1P0 at 50w2dwho presents to maternity admissions reporting no fetal movement all day. Has occasional mild contractions. Also had some pink vaginal discharge. . She denies LOF, vaginal itching/burning, urinary symptoms, h/a, dizziness, n/v, diarrhea, constipation or fever/chills.    Vaginal Discharge The patient's primary symptoms include vaginal bleeding and vaginal discharge. The patient's pertinent negatives include no genital itching, genital lesions, genital odor or pelvic pain. This is a new problem. The current episode started today. The problem occurs intermittently. The problem has been unchanged. The pain is mild. She is pregnant. Pertinent negatives include no chills, constipation, diarrhea, fever, nausea or vomiting. The vaginal discharge was bloody (pink). The vaginal bleeding is spotting. She has not been passing clots. She has not been passing tissue. Nothing aggravates the symptoms. She has tried nothing for the symptoms.    RN Note: Vickie Sutton is a 20 y.o. at [redacted]w[redacted]d here in MAU reporting: no fetal movement all day. Reports some contractions every couple minutes.  Last cervical exam was 1 cm.  Reports some pink mucus like discharge. Denies leaking of fluid.   Past Medical History: Past Medical History:  Diagnosis Date  . ADD (attention deficit disorder)   . Anxiety   . Asthma   . Migraine   . Panic attacks   . Polycystic ovarian syndrome   . POTS (postural orthostatic tachycardia syndrome)     Past obstetric history: OB History  Gravida Para Term Preterm AB Living  1            SAB IAB Ectopic Multiple Live Births               # Outcome Date GA Lbr Len/2nd Weight Sex Delivery Anes PTL Lv  1 Current             Past Surgical History: Past Surgical History:  Procedure Laterality Date  . ESOPHAGOGASTRODUODENOSCOPY    .  LAPAROTOMY N/A 09/07/2020   Procedure: EXPLORATORY LAPAROTOMY WITH EXTRACTION  OVARIAN CYST;  Surgeon: Florian Buff, MD;  Location: [place];  Service: Gynecology;  Laterality: N/A;  . ROUX-EN-Y GASTRIC BYPASS     August 2020 - [facility]  . TOOTH EXTRACTION N/A 12/12/2015   Procedure: EXTRACTION MOLARS - teeth one, sixteen, seventeen and thirty-two;  Surgeon: Diona Browner, DDS;  Location: [place];  Service: Oral Surgery;  Laterality: N/A;    Family History: Family History  Problem Relation Age of Onset  . Obesity Mother   . Diabetes Mellitus II Maternal Grandmother   . Heart disease Maternal Grandmother   . Yves Dill Parkinson White syndrome Brother   . Seizures Maternal Aunt     Social History: Social History   Tobacco Use  . Smoking status: Never Smoker  . Smokeless tobacco: Never Used  Vaping Use  . Vaping Use: Never used  Substance Use Topics  . Alcohol use: No  . Drug use: No    Allergies: No Known Allergies  Meds:  Medications Prior to Admission  Medication Sig Dispense Refill Last Dose  . albuterol (VENTOLIN HFA) 108 (90 Base) MCG/ACT inhaler Inhale 2 puffs into the lungs 4 (four) times daily as needed for wheezing or shortness of breath.    Past Month at Unknown time  . Cyanocobalamin (VITAMIN B12 PO) Take 1 tablet by mouth daily.    02/08/2021 at Unknown time  .  Docusate Sodium (STOOL SOFTENER LAXATIVE PO) Take 1 tablet by mouth daily.   02/08/2021 at Unknown time  . Ferrous Sulfate (IRON PO) Take 1 tablet by mouth daily.   02/08/2021 at Unknown time  . FOLIC ACID PO Take 1 tablet by mouth daily.   02/08/2021 at Unknown time  . metoprolol succinate (TOPROL-XL) 25 MG 24 hr tablet Take 0.5 tablets (12.5 mg total) by mouth at bedtime. 30 tablet 0 02/08/2021 at Unknown time  . Prenatal Vit-Fe Fumarate-FA (PRENATAL MULTIVITAMIN) TABS tablet Take 1 tablet by mouth daily.    02/08/2021 at Unknown time  . valACYclovir (VALTREX) 1000 MG tablet Take 4,000 mg by mouth once.   02/08/2021 at  Unknown time  . Elastic Bandages & Supports (COMFORT FIT MATERNITY SUPP SM) MISC 1 Units by Does not apply route daily as needed. 1 each 0   . Magnesium Oxide (MAG-OXIDE) 200 MG TABS Take 2 tablets (400 mg total) by mouth at bedtime. If that amount causes loose stools in the am, switch to 200mg  daily at bedtime. 60 tablet 3 More than a month at Unknown time  . Polyethylene Glycol 3350 (MIRALAX PO) Take 17 g by mouth daily as needed (constipation).   More than a month at Unknown time    I have reviewed patient's Past Medical Hx, Surgical Hx, Family Hx, Social Hx, medications and allergies.   ROS:  Review of Systems  Constitutional: Negative for chills and fever.  Gastrointestinal: Negative for constipation, diarrhea, nausea and vomiting.  Genitourinary: Positive for vaginal discharge. Negative for pelvic pain.   Other systems negative  Physical Exam   Patient Vitals for the past 24 hrs:  BP Temp Pulse Resp SpO2 Height Weight  02/09/21 0212 127/74 98.4 F (36.9 C) (!) 59 15 100 % 5\' 5"  (1.651 m) 91.1 kg   Constitutional: Well-developed, well-nourished female in no acute distress.  Cardiovascular: normal rate and rhythm Respiratory: normal effort, clear to auscultation bilaterally GI: Abd soft, non-tender, gravid appropriate for gestational age.   No rebound or guarding. MS: Extremities nontender, no edema, normal ROM Neurologic: Alert and oriented x 4.  GU: Neg CVAT.  PELVIC EXAM:  Dilation: 3 Effacement (%): 70,80 Cervical Position: Middle Station: -2 Presentation: Vertex Exam by:: K Torphy, RN Changed to 3+ after 1.5 hours  FHT:  Baseline 130 , moderate variability, accelerations present, no decelerations Contractions: Irregular     Labs: No results found for this or any previous visit (from the past 24 hour(s)). --/--/B POS Performed at [facility], [place] [address] [place], [place] [address]  (09/15 1005)  Imaging:    MAU Course/MDM: NST reviewed,  initially nonreactive by criteria, then became reactive  Treatments in MAU included EFM She has changed her cervix a little but is not in labor.  Discussed signs of labor.   UCs very occasional, not painful.  Small bloody show, discussed when UCs become painful needs to come back since she is changing cervix a little with few contractions.    Assessment: Single IUP at [redacted]w[redacted]d Decreased fetal movement Reassuring Nonstress Test  Plan: Discharge home Labor precautions and fetal kick counts Follow up in Office for prenatal visits and recheck cervix  Encouraged to return if she develops worsening of symptoms, increase in pain, fever, or other concerning symptoms.  Pt stable at time of discharge.  Hansel Feinstein CNM, MSN Certified Nurse-Midwife 02/09/2021 3:05 AM

## 2021-02-09 NOTE — Progress Notes (Signed)
RN spoke with Mali Queen, RN on call nurse for [facility] at work and confirmed COVID+ on 01/20/21.

## 2021-02-09 NOTE — Anesthesia Preprocedure Evaluation (Signed)
Anesthesia Evaluation  Patient identified by MRN, date of birth, ID band Patient awake    Reviewed: Allergy & Precautions, H&P , Patient's Chart, lab work & pertinent test results  Airway Mallampati: II       Dental no notable dental hx.    Pulmonary asthma ,    Pulmonary exam normal        Cardiovascular negative cardio ROS Normal cardiovascular exam     Neuro/Psych  Headaches, PSYCHIATRIC DISORDERS Anxiety Depression    GI/Hepatic negative GI ROS, Neg liver ROS,   Endo/Other  negative endocrine ROS  Renal/GU negative Renal ROS  negative genitourinary   Musculoskeletal negative musculoskeletal ROS (+)   Abdominal (+) + obese,   Peds  Hematology negative hematology ROS (+)   Anesthesia Other Findings   Reproductive/Obstetrics (+) Pregnancy                             Anesthesia Physical Anesthesia Plan  ASA: II  Anesthesia Plan: Epidural   Post-op Pain Management:    Induction:   PONV Risk Score and Plan:   Airway Management Planned:   Additional Equipment:   Intra-op Plan:   Post-operative Plan:   Informed Consent: I have reviewed the patients History and Physical, chart, labs and discussed the procedure including the risks, benefits and alternatives for the proposed anesthesia with the patient or authorized representative who has indicated his/her understanding and acceptance.       Plan Discussed with:   Anesthesia Plan Comments:         Anesthesia Quick Evaluation

## 2021-02-09 NOTE — H&P (Signed)
Vickie Sutton is a 20 y.o. female, G1P0 at 39.2 weeks, presenting for early labor at 5cm with c/o DFM.  Patient receives care at [facility] and was supervised for a low-risk pregnancy. Pregnancy and medical history significant for problems as listed below. She is GBS negative and expresses a desire for epidural for pain management.  She is anticipating a female infant for circ and requests pills for PP birth control method.     Patient Active Problem List   Diagnosis Date Noted  . Supervision of normal first pregnancy 08/08/2020  . History of Roux-en-Y gastric bypass 08/08/2020  . PCOS (polycystic ovarian syndrome) 08/08/2020  . Anxiety 08/08/2020  . Syncope 07/25/2020  . POTS (postural orthostatic tachycardia syndrome) 05/03/2020  . Asthma 03/23/2019    History of present pregnancy:  Last evaluation:  02/09/2021 in office with Doree Fudge, CNM SVE: 5/90/-1, Vitals: BP: 126/74  Pulse: 77  Weight: 198 lb (89.8 kg)  Body mass index is 32.95 kg/m.   [facility]  LAB RESULTS  Language English Pap <21  Initiated care at 12wks GC/CT Initial:  -/-          36wks:-/-  Dating by 8wk u/s    Support person [place] NT/IT: neg    AFP:       Panorama:Low Risk Female     Carrier Screen neg  Flu vaccine Nov 2021 [place]/Hgb Elec neg  TDaP vaccine 11/08/20     Rhogam n/a Blood Type --/--/B POS Performed at [facility], [place] [address] [place], [place] [address]  [phone])    Antibody NEG [phone])  Anatomy US Female '[place]' HBsAg Negative (07/20 0000)  Feeding Plan breast RPR Nonreactive (07/20 0000)  Contraception POPs Rubella  Immune (07/20 0000)  Circumcision Yes, at [facility] HIV Non-reactive (07/20 0000)  Pediatrician [facility] Hep C neg  Prenatal Classes discussed      A1C/GTT Early:      26-28wks: normal  BTL Consent n/a    VBAC Consent n/a GBS   neg     [ ]  PCN allergy  Waterbirth [ ] Class [ ] Consent [ ] CNM visit        OB History    Gravida  1   Para      Term       Preterm      AB      Living        SAB      IAB      Ectopic      Multiple      Live Births                Past Medical History:  Diagnosis Date  . ADD (attention deficit disorder)   . Anxiety   . Asthma   . Migraine   . Panic attacks   . Polycystic ovarian syndrome   . POTS (postural orthostatic tachycardia syndrome)    Past Surgical History:  Procedure Laterality Date  . ESOPHAGOGASTRODUODENOSCOPY    . LAPAROTOMY N/A 09/07/2020   Procedure: EXPLORATORY LAPAROTOMY WITH EXTRACTION  OVARIAN CYST;  Surgeon: Florian Buff, MD;  Location: [place];  Service: Gynecology;  Laterality: N/A;  . ROUX-EN-Y GASTRIC BYPASS     August 2020 - [facility]  . TOOTH EXTRACTION N/A 12/12/2015   Procedure: EXTRACTION MOLARS - teeth one, sixteen, seventeen and thirty-two;  Surgeon: Diona Browner, DDS;  Location: [place];  Service: Oral Surgery;  Laterality: N/A;   Family History:  family history includes Diabetes Mellitus II in her maternal grandmother; Heart disease in her maternal grandmother; Obesity in her mother; Seizures in her maternal aunt; Yves Dill Parkinson White syndrome in her brother. Social History:  reports that she has never smoked. She has never used smokeless tobacco. She reports that she does not drink alcohol and does not use drugs.   Prenatal Transfer Tool  Maternal Diabetes: No Genetic Screening: Normal Maternal Ultrasounds/Referrals: Normal Fetal Ultrasounds or other Referrals:  None Maternal Substance Abuse:  No Significant Maternal Medications:  Meds include: Other: Stool Softener, Vitamin B12, Toprol-XL, PNV, Iron Supplement.  Significant Maternal Lab Results: Group B Strep negative   Maternal Assessment:  ROS: +Contractions, -LOF, -Vaginal Bleeding, +Fetal Movement  All other systems reviewed and negative.    No Known Allergies   Dilation: 5 Effacement (%): 90 Station: -1 Exam by:: n druebbisch rn Blood pressure 136/75, pulse 63, temperature 98.3 F  (36.8 C), temperature source Oral, resp. rate 20, SpO2 100 %.  Physical Exam Constitutional:      Appearance: Normal appearance. She is obese.  HENT:     Head: Normocephalic and atraumatic.  Eyes:     Conjunctiva/sclera: Conjunctivae normal.  Cardiovascular:     Rate and Rhythm: Normal rate and regular rhythm.     Heart sounds: Normal heart sounds.  Pulmonary:     Effort: Pulmonary effort is normal. No respiratory distress.     Breath sounds: Normal breath sounds.  Abdominal:     General: Bowel sounds are normal.     Tenderness: There is no abdominal tenderness.     Comments: Scars c/w Laparoscopic surgery.  Edema and some erythema noted at panus.  Musculoskeletal:        General: Normal range of motion.     Cervical back: Normal range of motion.  Skin:    General: Skin is warm and dry.  Neurological:     Mental Status: She is alert and oriented to person, place, and time.  Psychiatric:        Mood and Affect: Mood normal.        Behavior: Behavior normal.        Thought Content: Thought content normal.     Fetal Assessment: -Pelvis: Not Evaluated -EFW: 7 1/2 lbs -Presentation: Vertex by Nurse Exam  FHR: 140 bpm, Mod Var, -Decels, +Accels UCs:  Q55min, palpates mild    Assessment IUP at 39.2 weeks Cat I FT Early Labor GBS Negative H/O Gastric Bypass POTS  Plan: Admit to [facility]  Routine Labor and Delivery Orders per Protocol In room to complete assessment and discuss POC: Discussed possible augmentation of labor. Patient without questions or concerns. L&D Team updated on patient status.   Loann Quill, MSN 02/09/2021, 6:58 PM

## 2021-02-09 NOTE — Anesthesia Procedure Notes (Signed)
Epidural Patient location during procedure: OB Start time: 02/09/2021 10:19 PM End time: 02/09/2021 10:21 PM  Staffing Anesthesiologist: Lyn Hollingshead, MD Performed: anesthesiologist   Preanesthetic Checklist Completed: patient identified, IV checked, site marked, risks and benefits discussed, surgical consent, monitors and equipment checked, pre-op evaluation and timeout performed  Epidural Patient position: sitting Prep: DuraPrep and site prepped and draped Patient monitoring: continuous pulse ox and blood pressure Approach: midline Location: L3-L4 Injection technique: LOR air  Needle:  Needle type: Tuohy  Needle gauge: 17 G Needle length: 9 cm and 9 Needle insertion depth: 7 cm Catheter type: closed end flexible Catheter size: 19 Gauge Catheter at skin depth: 12 cm Test dose: negative and Other  Assessment Events: blood not aspirated, injection not painful, no injection resistance, no paresthesia and negative IV test  Additional Notes Reason for block:procedure for pain

## 2021-02-09 NOTE — Patient Instructions (Signed)
Vickie Sutton, I greatly value your feedback.  If you receive a survey following your visit with [place] today, we appreciate you taking the time to fill it out.  Thanks, Knute Neu, CNM, WHNP-BC  [facility] [place] at [facility] ([place], [place] [address]) Entrance C, located off of [address] parking   Go to [email or web address] to register for FREE online childbirth classes    Call the office [phone]) or go to [facility] if:  You begin to have strong, frequent contractions  Your water breaks.  Sometimes it is a big gush of fluid, sometimes it is just a trickle that keeps getting your panties wet or running down your legs  You have vaginal bleeding.  It is normal to have a small amount of spotting if your cervix was checked.   You don't feel your baby moving like normal.  If you don't, get you something to eat and drink and lay down and focus on feeling your baby move.  You should feel at least 10 movements in 2 hours.  If you don't, you should call the office or go to [facility].   Call the office [phone]) or go to [facility] hospital for these signs of pre-eclampsia:  Severe headache that does not go away with Tylenol  Visual changes- seeing spots, double, blurred vision  Pain under your right breast or upper abdomen that does not go away with Tums or heartburn medicine  Nausea and/or vomiting  Severe swelling in your hands, feet, and face    Home Blood Pressure Monitoring for Patients   Your provider has recommended that you check your blood pressure (BP) at least once a week at home. If you do not have a blood pressure cuff at home, one will be provided for you. Contact your provider if you have not received your monitor within 1 week.   Helpful Tips for Accurate Home Blood Pressure Checks  . Don't smoke, exercise, or drink caffeine 30 minutes before checking your BP . Use the restroom before checking your BP (a full  bladder can raise your pressure) . Relax in a comfortable upright chair . Feet on the ground . Left arm resting comfortably on a flat surface at the level of your heart . Legs uncrossed . Back supported . Sit quietly and don't talk . Place the cuff on your bare arm . Adjust snuggly, so that only two fingertips can fit between your skin and the top of the cuff . Check 2 readings separated by at least one minute . Keep a log of your BP readings . For a visual, please reference this diagram: http://ccnc.care/bpdiagram  Provider Name: [facility]     Phone: [phone]  Zone 1: ALL CLEAR  Continue to monitor your symptoms:  . BP reading is less than 140 (top number) or less than 90 (bottom number)  . No right upper stomach pain . No headaches or seeing spots . No feeling nauseated or throwing up . No swelling in face and hands  Zone 2: CAUTION Call your doctor's office for any of the following:  . BP reading is greater than 140 (top number) or greater than 90 (bottom number)  . Stomach pain under your ribs in the middle or right side . Headaches or seeing spots . Feeling nauseated or throwing up . Swelling in face and hands  Zone 3: EMERGENCY  Seek immediate medical care if you have any of  the following:  . BP reading is greater than160 (top number) or greater than 110 (bottom number) . Severe headaches not improving with Tylenol . Serious difficulty catching your breath . Any worsening symptoms from Zone 2   Braxton Hicks Contractions Contractions of the uterus can occur throughout pregnancy, but they are not always a sign that you are in labor. You may have practice contractions called Braxton Hicks contractions. These false labor contractions are sometimes confused with true labor. What are [place] contractions? Braxton Hicks contractions are tightening movements that occur in the muscles of the uterus before labor. Unlike true labor contractions, these  contractions do not result in opening (dilation) and thinning of the cervix. Toward the end of pregnancy (32-34 weeks), Braxton Hicks contractions can happen more often and may become stronger. These contractions are sometimes difficult to tell apart from true labor because they can be very uncomfortable. You should not feel embarrassed if you go to the hospital with false labor. Sometimes, the only way to tell if you are in true labor is for your health care provider to look for changes in the cervix. The health care provider will do a physical exam and may monitor your contractions. If you are not in true labor, the exam should show that your cervix is not dilating and your water has not broken. If there are no other health problems associated with your pregnancy, it is completely safe for you to be sent home with false labor. You may continue to have Braxton Hicks contractions until you go into true labor. How to tell the difference between true labor and false labor True labor  Contractions last 30-70 seconds.  Contractions become very regular.  Discomfort is usually felt in the top of the uterus, and it spreads to the lower abdomen and low back.  Contractions do not go away with walking.  Contractions usually become more intense and increase in frequency.  The cervix dilates and gets thinner. False labor  Contractions are usually shorter and not as strong as true labor contractions.  Contractions are usually irregular.  Contractions are often felt in the front of the lower abdomen and in the groin.  Contractions may go away when you walk around or change positions while lying down.  Contractions get weaker and are shorter-lasting as time goes on.  The cervix usually does not dilate or become thin. Follow these instructions at home:  1. Take over-the-counter and prescription medicines only as told by your health care provider. 2. Keep up with your usual exercises and follow other  instructions from your health care provider. 3. Eat and drink lightly if you think you are going into labor. 4. If Braxton Hicks contractions are making you uncomfortable: ? Change your position from lying down or resting to walking, or change from walking to resting. ? Sit and rest in a tub of warm water. ? Drink enough fluid to keep your urine pale yellow. Dehydration may cause these contractions. ? Do slow and deep breathing several times an hour. 5. Keep all follow-up prenatal visits as told by your health care provider. This is important. Contact a health care provider if:  You have a fever.  You have continuous pain in your abdomen. Get help right away if:  Your contractions become stronger, more regular, and closer together.  You have fluid leaking or gushing from your vagina.  You pass blood-tinged mucus (bloody show).  You have bleeding from your vagina.  You have low back  pain that you never had before.  You feel your baby's head pushing down and causing pelvic pressure.  Your baby is not moving inside you as much as it used to. Summary  Contractions that occur before labor are called Braxton Hicks contractions, false labor, or practice contractions.  Braxton Hicks contractions are usually shorter, weaker, farther apart, and less regular than true labor contractions. True labor contractions usually become progressively stronger and regular, and they become more frequent.  Manage discomfort from [facility] contractions by changing position, resting in a warm bath, drinking plenty of water, or practicing deep breathing. This information is not intended to replace advice given to you by your health care provider. Make sure you discuss any questions you have with your health care provider. Document Revised: 11/22/2017 Document Reviewed: 04/25/2017 Elsevier Patient Education  [place].

## 2021-02-10 ENCOUNTER — Encounter (HOSPITAL_COMMUNITY): Payer: Self-pay | Admitting: Family Medicine

## 2021-02-10 DIAGNOSIS — Z3A39 39 weeks gestation of pregnancy: Secondary | ICD-10-CM

## 2021-02-10 LAB — RPR: RPR Ser Ql: NONREACTIVE

## 2021-02-10 MED ORDER — METOPROLOL SUCCINATE 12.5 MG HALF TABLET
12.5000 mg | ORAL_TABLET | Freq: Every day | ORAL | Status: DC
  Administered 2021-02-10 – 2021-02-11 (×2): 12.5 mg via ORAL
  Filled 2021-02-10 (×3): qty 1

## 2021-02-10 MED ORDER — DIPHENHYDRAMINE HCL 25 MG PO CAPS: 25.0000 mg | ORAL_CAPSULE | Freq: Four times a day (QID) | ORAL | Status: DC | PRN

## 2021-02-10 MED ORDER — DIBUCAINE (PERIANAL) 1 % EX OINT: 1.0000 "application " | TOPICAL_OINTMENT | CUTANEOUS | Status: DC | PRN

## 2021-02-10 MED ORDER — TERBUTALINE SULFATE 1 MG/ML IJ SOLN: 0.2500 mg | Freq: Once | INTRAMUSCULAR | Status: DC | PRN

## 2021-02-10 MED ORDER — ONDANSETRON HCL 4 MG PO TABS: 4.0000 mg | ORAL_TABLET | ORAL | Status: DC | PRN

## 2021-02-10 MED ORDER — IBUPROFEN 600 MG PO TABS
600.0000 mg | ORAL_TABLET | Freq: Four times a day (QID) | ORAL | Status: DC
  Administered 2021-02-10 – 2021-02-12 (×8): 600 mg via ORAL
  Filled 2021-02-10 (×8): qty 1

## 2021-02-10 MED ORDER — SIMETHICONE 80 MG PO CHEW: 80.0000 mg | CHEWABLE_TABLET | ORAL | Status: DC | PRN

## 2021-02-10 MED ORDER — SENNOSIDES-DOCUSATE SODIUM 8.6-50 MG PO TABS
2.0000 | ORAL_TABLET | Freq: Every day | ORAL | Status: DC
  Administered 2021-02-11: 2 via ORAL
  Filled 2021-02-10: qty 2

## 2021-02-10 MED ORDER — TETANUS-DIPHTH-ACELL PERTUSSIS 5-2.5-18.5 LF-MCG/0.5 IM SUSY: 0.5000 mL | PREFILLED_SYRINGE | Freq: Once | INTRAMUSCULAR | Status: DC

## 2021-02-10 MED ORDER — ACETAMINOPHEN 325 MG PO TABS: 650.0000 mg | ORAL_TABLET | ORAL | Status: DC | PRN

## 2021-02-10 MED ORDER — BENZOCAINE-MENTHOL 20-0.5 % EX AERO
1.0000 "application " | INHALATION_SPRAY | CUTANEOUS | Status: DC | PRN
  Administered 2021-02-10 – 2021-02-11 (×2): 1 via TOPICAL
  Filled 2021-02-10 (×2): qty 56

## 2021-02-10 MED ORDER — ZOLPIDEM TARTRATE 5 MG PO TABS: 5.0000 mg | ORAL_TABLET | Freq: Every evening | ORAL | Status: DC | PRN

## 2021-02-10 MED ORDER — PRENATAL MULTIVITAMIN CH
1.0000 | ORAL_TABLET | Freq: Every day | ORAL | Status: DC
  Administered 2021-02-10 – 2021-02-11 (×2): 1 via ORAL
  Filled 2021-02-10 (×2): qty 1

## 2021-02-10 MED ORDER — WITCH HAZEL-GLYCERIN EX PADS
1.0000 "application " | MEDICATED_PAD | CUTANEOUS | Status: DC | PRN
  Administered 2021-02-10: 1 via TOPICAL

## 2021-02-10 MED ORDER — ONDANSETRON HCL 4 MG/2ML IJ SOLN: 4.0000 mg | INTRAMUSCULAR | Status: DC | PRN

## 2021-02-10 MED ORDER — COCONUT OIL OIL
1.0000 "application " | TOPICAL_OIL | Status: DC | PRN
  Administered 2021-02-11: 1 via TOPICAL

## 2021-02-10 MED ORDER — OXYTOCIN-SODIUM CHLORIDE 30-0.9 UT/500ML-% IV SOLN
1.0000 m[IU]/min | INTRAVENOUS | Status: DC
  Administered 2021-02-10: 2 m[IU]/min via INTRAVENOUS

## 2021-02-10 NOTE — Anesthesia Postprocedure Evaluation (Signed)
Anesthesia Post Note  Patient: Vickie Sutton  Procedure(s) Performed: AN AD HOC LABOR EPIDURAL     Anesthesia Post Evaluation No complications documented.  Last Vitals:  Vitals:   02/10/21 1630 02/10/21 2018  BP: (!) 94/59 (!) 92/57  Pulse: 72 61  Resp: 18 18  Temp: 36.9 C 37 C  SpO2: 98% 98%    Last Pain:  Vitals:   02/10/21 2018  TempSrc: Oral  PainSc: 0-No pain                 Huston Foley

## 2021-02-10 NOTE — Anesthesia Postprocedure Evaluation (Signed)
Anesthesia Post Note  Patient: Vickie Sutton  Procedure(s) Performed: AN AD [place]     Patient location during evaluation: Mother Baby Anesthesia Type: Epidural Level of consciousness: awake Pain management: satisfactory to patient Vital Signs Assessment: post-procedure vital signs reviewed and stable Respiratory status: spontaneous breathing Cardiovascular status: stable Anesthetic complications: no   No complications documented.  Last Vitals:  Vitals:   02/10/21 0745 02/10/21 1235  BP: 104/75   Pulse:    Resp: 20 20  Temp: 37.1 C   SpO2:      Last Pain:  Vitals:   02/10/21 1235  TempSrc: Oral  PainSc: 0-No pain   Pain Goal:                   [employer]

## 2021-02-10 NOTE — Lactation Note (Signed)
This note was copied from a baby's chart. Lactation Consultation Note Mom trying to latch when [place] entered rm. Baby in football position by RN. Mom has flat semi compressible nipples. Colostrum noted w/hand expression. In order for baby to latch, LC t-cup areola/nipple into baby's mouth and held it while he suckled. If LC let go of breast tissue, baby is unable to maintain latch. Baby cueing suckling on breast tissue. Repeat off and on the breast.  Praised mom.  Mom will be f/u on MBU.  Patient Name: Vickie Sutton UPBDH'D Date: 02/10/2021 Reason for consult: L&D Initial assessment;[place]ap[place];Term;Maternal endocrine disorder Age:27 hours  Maternal Data Has patient been taught Hand Expression?: Yes Does the patient have breastfeeding experience prior to this delivery?: No  Feeding    LATCH Score Latch: Repeated attempts needed to sustain latch, nipple held in mouth throughout feeding, stimulation needed to elicit sucking reflex.  Audible Swallowing: None  Type of Nipple: Flat  Comfort (Breast/Nipple): Filling, red/small blisters or bruises, mild/mod discomfort (edema)  Hold (Positioning): Full assist, staff holds infant at breast  LATCH Score: 3   Lactation Tools Discussed/Used    Interventions Interventions: Assisted with latch;Skin to skin;Breast massage;Hand express;Breast compression  Discharge    Consult Status Consult Status: Follow-up Date: 02/10/21 Follow-up type: In-patient    [name], Elta Gu[place]oupe 02/10/2021, 5:09 AM

## 2021-02-10 NOTE — Social Work (Signed)
CSW attempted to meet with MOB to complete assessment. MOB was pumping and requested CSW return at a later time. CSW will follow-up prior to [facility] discharge.  Darra Lis, MSW, [employer] [phone]

## 2021-02-10 NOTE — Progress Notes (Signed)
Vickie Sutton is a 20 y.o. G1P0 admitted at [redacted]w[redacted]d for SOL.  Subjective: Pushing .   Objective: BP 110/70   Pulse 95   Temp 98.7 F (37.1 C) (Oral)   Resp 18   SpO2 100%  No intake/output data recorded.  FHT:  FHR: 140 bpm, variability: moderate,  accelerations:  Present,  decelerations:  absent  SVE:   Dilation: 10 Effacement (%): 100 Station: Plus 2 Exam by:: Huntley Dec RNC  Labs: Lab Results  Component Value Date   WBC 12.6 (H) 02/09/2021   HGB 12.0 02/09/2021   HCT 36.4 02/09/2021   MCV 87.1 02/09/2021   PLT 237 02/09/2021    Assessment / Plan: Vickie Sutton is a 20 y.o. G1P0 admitted at [redacted]w[redacted]d for SOL.  #Labor: Pushing for one hour with minimal change. Pitocin started for suboptimal contraction pattern. Will labor down and reevaluate in 1-2 hours.  #Pain: epidural  #FWB: Cat I #ID: GBS neg #MOF: Breast #MOC:Unsure #Circ: Yes  Janet Berlin, MD 02/10/2021, 1:13 AM

## 2021-02-10 NOTE — Lactation Note (Signed)
This note was copied from a baby's chart. Lactation Consultation Note  Patient Name: Boy Dorinne Graeff JQZES'P Date: 02/10/2021   Age:22 hours  P1 mother whose infant is now 43 hours old.  This is a term baby at 39+3 weeks.  RN had set up a DEBP for mother and she pumped 18 mls of EBM the first time she pumped earlier today.  Recently she pumped and obtained 4 mls of EBM and seemed discouraged that she did not meet the same volume she had earlier.  Reassured mother that any volume is good and not to be discouraged.  The 18 mls was an exceptionally good amount for a baby at this age.  Mother reassured.  Mother's breasts are soft and nipples are very short shafted.  Provided breast shells with instructions for use and mother began using them.  Demonstrated pre-pumping and how to use her manual pump to assist with nipple eversion.  She will feed at least every three hours or sooner if baby shows cues.  Mother will continue hand expression, breast massage and pre-pumping prior to latching.  She will call her RN/LC for assistance with latching as needed.  Praised mother's efforts.  Father present.  Mother has a DEBP for home use.  RN updated.   Maternal Data    Feeding    LATCH Score                    Lactation Tools Discussed/Used    Interventions    Discharge    Consult Status Consult Status: Follow-up Date: 02/11/21 Follow-up type: In-patient    Little Ishikawa 02/10/2021, 5:12 PM

## 2021-02-11 NOTE — Lactation Note (Signed)
This note was copied from a baby's chart. Lactation Consultation Note  Patient Name: Vickie Sutton OKHTX'H Date: 02/11/2021 Reason for consult: Follow-up assessment;Mother's request;1st time breastfeeding;Term;Infant weight loss (-5% weight loss.) Age:22 hours P1, term female infant with -5% weight loss. Mom mostly been formula feeding and not latching infant at the breast. LC entered room, mom recently use hand pump and expressed 5 mls of colostrum in pump. Mom has flat nipples and thought infant would not latch to her breast. Mom open to having [place] assist her with latch, mom fitted with 24 mm NS, NS was pre-filled with 0.5 mls of EBM using a curve tip syringe. Infant sustained latch with 24 mm NS and infant was supplement at the breast with 5 mls of EBM and 4 mls of formula. Mom was pleased that infant is now latching at the breast and her plan is to latch infant first with every feeding going forward. Mom will supplement infant at the breast or with a bottle. Mom knows to BF infant according to cues, 8 to 12+ times within 24 hours, STS. Mom knows to call RN or LC if she needs further assistance with latching infant at the breast.  Maternal Datanf    Feeding Mother's Current Feeding Choice: Breast Milk and Formula Nipple Type: Slow - flow  LATCH Score Latch: Grasps breast easily, tongue down, lips flanged, rhythmical sucking. (Mom fitted with 24 mm NS and infant supplement with EBM and formula at breast with curve tip syringe.)  Audible Swallowing: Spontaneous and intermittent  Type of Nipple: Flat  Comfort (Breast/Nipple): Soft / non-tender  Hold (Positioning): Assistance needed to correctly position infant at breast and maintain latch.  LATCH Score: 8   Lactation Tools Discussed/Used Tools: Pump Breast pump type: Double-Electric Breast Pump;Manual  Interventions Interventions: Assisted with latch;Skin to skin;Breast compression;Adjust position;Support pillows;Position  options;Expressed milk;Education  Discharge    Consult Status Consult Status: Follow-up Date: 02/12/21 Follow-up type: In-patient    Vicente Serene 02/11/2021, 10:01 PM

## 2021-02-11 NOTE — Progress Notes (Signed)
CSW received consult for hx of Anxiety,Depression, ADD and panic attacks.  CSW met with MOB to offer support and complete assessment.    CSW met with MOB at bedside and introduced self, FOB present. CSW asked FOB to leave the room to speak with MOB privately, FOB left the room. CSW explained reason for consult. MOB was welcoming, pleasant, open, talkative and remained engaged during assessment. CSW and MOB discussed MOB's mental health history. MOB reported that she was diagnosed with depression while in middle school and reported seeing a psychiatrist during middle and high school. MOB denied any current depressive symptoms. MOB reported that she was diagnosed with anxiety last year. MOB reported that she has generalized anxiety and described her symptoms as racing thoughts, over thinking and catastrophizing. MOB reported that she also has panic attacks. MOB reported that she is not taking any medication nor participating in therapy to treat her mental health diagnoses. MOB reported that she was taking antidepressants prior to pregnancy and may restart her medication. CSW encouraged MOB to follow up with her OB provider about her plans to restart antidepressant medication, MOB agreed. CSW inquired about MOB's coping skills, MOB reported that breathing techniques and imagining being calm places are helpful coping skills. CSW positively affirmed MOB's healthy coping skills. CSW inquired about MOB's support system, MOB reported that FOB/Fiance and her mom are supports. MOB presented calm and with insight about her mental health history. MOB did not demonstrate any acute mental health signs/symptoms. CSW assessed for safety, MOB denied SI, HI and domestic violence.   CSW provided education regarding the baby blues period vs. perinatal mood disorders, discussed treatment and gave resources for mental health follow up if concerns arise.  CSW recommends self-evaluation during the postpartum time period using the New  Mom Checklist from Postpartum Progress and encouraged MOB to contact a medical professional if symptoms are noted at any time.    CSW provided review of Sudden Infant Death Syndrome (SIDS) precautions. MOB verbalized understanding and reported that they have a basinet and crib for infant to sleep in. MOB reported that she has all essential items needed to care for infant.   CSW identifies no further need for intervention and no barriers to discharge at this time.  Holly Iannaccone, LCSW Clinical Social Worker [facility] Cell#: [phone]   

## 2021-02-11 NOTE — Progress Notes (Signed)
POSTPARTUM PROGRESS NOTE  Post Partum Day 1  Subjective:  Vickie Sutton is a 20 y.o. G1P1001 s/p SVD at [redacted]w[redacted]d.  No acute events overnight.  Pt denies problems with ambulating, voiding or po intake.  She denies nausea or vomiting.  Pain is well controlled.  She has had flatus. She has not had bowel movement.  Lochia Moderate.   Objective: Blood pressure 97/73, pulse 64, temperature 97.8 F (36.6 C), temperature source Oral, resp. rate 18, SpO2 99 %, unknown if currently breastfeeding.  Physical Exam:  General: alert, cooperative and no distress Chest: no respiratory distress Heart:regular rate, distal pulses intact Abdomen: soft, nontender,  Uterine Fundus: firm, appropriately tender DVT Evaluation: No calf swelling or tenderness Extremities: no edema Skin: warm, dry  Recent Labs    02/09/21 1846  HGB 12.0  HCT 36.4    Assessment/Plan: Vickie Sutton is a 20 y.o. G1P1001 s/p SVD at [redacted]w[redacted]d   PPD#1 - Doing well Contraception: POPs at discharge  Feeding: breast - plans to meet with lactation today for continued assist with breastfeeding  Dispo: Plan for discharge later tonight if baby is discharged or tomorrow.    LOS: 2 days   Lajean Manes, CNM 02/11/2021, 8:40 AM

## 2021-02-12 MED ORDER — IBUPROFEN 600 MG PO TABS: 600.0000 mg | ORAL_TABLET | Freq: Four times a day (QID) | ORAL | 0 refills | Status: DC | PRN

## 2021-02-12 NOTE — Discharge Instructions (Signed)

## 2021-02-12 NOTE — Lactation Note (Signed)
This note was copied from a baby's chart. Lactation Consultation Note  Patient Name: Vickie Sutton HERDE'Y Date: 02/12/2021 Reason for consult: Follow-up assessment Age:22 hours   Mother breastfeeding infant when [facility] arrived in room. Observed that infant was sliding on and off the nipple shield. Observed milk in the shield. Mother leaning over the infant with infant lying on a pillow.   Mother reports that she is using a #24 NS and she was told that it was too big by other [place] and that we didn't have any #20 NS.  LC got a #20 NS and returned. Infant was so sleepy we were unable to get infant to sustain latch.   Discussed importance of latching infant with correct size and proper application. Taught mother to apply the nipple shield.  Mother advised to post pump for 15-20 mins after each feeding. At least 6 times daily.   Suggested to follow up with lactation consultant next week when milk comes to volume. Mother agreeable. A note will be sent to OP LC. Mother has a pump that she ordered from [facility].   Mother has been supplementing infant with ebm after each feeding.  Discussed treatment and prevention of engorgement.  Mother receptive to teaching and appreciates all education.  Maternal Data    Feeding Mother's Current Feeding Choice: Breast Milk  LATCH Score                    Lactation Tools Discussed/Used Tools: Nipple Shields Nipple shield size: 20 (at mothers request)  Interventions    Discharge Discharge Education: Engorgement and breast care;Warning signs for feeding baby;Outpatient recommendation;Outpatient Epic message sent  Consult Status Consult Status: Complete    Darla Lesches 02/12/2021, 12:16 PM

## 2021-02-12 NOTE — Discharge Summary (Signed)
Postpartum Discharge Summary     Patient Name: Vickie Sutton DOB: 10-11-2001 MRN: [id number]  Date of admission: 02/09/2021 Delivery date:02/10/2021  Delivering provider: [name]  Date of discharge: 02/12/2021  Admitting diagnosis: Decreased fetal movement [O36.8190] Intrauterine pregnancy: [redacted]w[redacted]d     Secondary diagnosis:  Active Problems:   POTS (postural orthostatic tachycardia syndrome)   Asthma   History of Roux-en-Y gastric bypass   Anxiety  Additional problems: none    Discharge diagnosis: Term Pregnancy Delivered                                              Post partum procedures:none Augmentation: AROM and Pitocin Complications: None  Hospital course: Onset of Labor With Vaginal Delivery      20 y.o. yo G1P1001 at [redacted]w[redacted]d was admitted in Latent Labor on 02/09/2021. She also had decreased FM, but was in early labor as well and progressed spontaneously to 8cm prior to having AROM and then a little Pitocin to increase ctx effectiveness. Patient had an uncomplicated labor course as follows:  Membrane Rupture Time/Date: 11:00 PM ,02/09/2021   Delivery Method:Vaginal, Spontaneous  Episiotomy: None  Lacerations:  Labial  Patient had an uncomplicated postpartum course.  She is ambulating, tolerating a regular diet, passing flatus, and urinating well. Patient is discharged home in stable condition on 02/12/21. She plans on using POPs for postpartum contraception, to be discussed at her PP visit.  Newborn Data: Birth date:02/10/2021  Birth time:4:04 AM  Gender:Female  Living status:Living  Apgars:9 ,9  Weight:3005 g (6lb 10oz)  Magnesium Sulfate received: No BMZ received: No Rhophylac:N/A MMR:N/A T-DaP:Given prenatally Flu: Yes Transfusion:No  Physical exam  Vitals:   02/11/21 0519 02/11/21 1453 02/11/21 2144 02/12/21 0550  BP: 97/73 93/60 107/65 (!) 104/56  Pulse: 64 76 76 (!) 59  Resp: $Remo'18 18 17 16  'fErBw$ Temp: 97.8 F (36.6 C) 98.1 F (36.7 C) 97.6 F (36.4  C) 98.3 F (36.8 C)  TempSrc: Oral Oral Oral Oral  SpO2: 99%  100% 99%   General: alert and cooperative Lochia: appropriate Uterine Fundus: firm Incision: N/A DVT Evaluation: No evidence of DVT seen on physical exam. Labs: Lab Results  Component Value Date   WBC 12.6 (H) 02/09/2021   HGB 12.0 02/09/2021   HCT 36.4 02/09/2021   MCV 87.1 02/09/2021   PLT 237 02/09/2021   CMP Latest Ref Rng & Units 11/28/2020  Glucose 70 - 99 mg/dL 84  BUN 6 - 20 mg/dL 6  Creatinine 0.44 - 1.00 mg/dL 0.46  Sodium 135 - 145 mmol/L 134(L)  Potassium 3.5 - 5.1 mmol/L 4.2  Chloride 98 - 111 mmol/L 101  CO2 22 - 32 mmol/L 25  Calcium 8.9 - 10.3 mg/dL 8.9  Total Protein 6.5 - 8.1 g/dL 6.0(L)  Total Bilirubin 0.3 - 1.2 mg/dL 0.5  Alkaline Phos 38 - 126 U/L 80  AST 15 - 41 U/L 19  ALT 0 - 44 U/L 15   Edinburgh Score: No flowsheet data found.   After visit meds:  Allergies as of 02/12/2021   No Known Allergies     Medication List    STOP taking these medications   Comfort Fit Maternity Supp Sm Misc   docusate sodium 100 MG capsule Commonly known as: COLACE   ferrous sulfate 325 (65 FE) MG tablet   folic acid  800 MCG tablet Commonly known as: FOLVITE   Mag-Oxide 200 MG Tabs Generic drug: Magnesium Oxide   VITAMIN B12 PO     TAKE these medications   albuterol 108 (90 Base) MCG/ACT inhaler Commonly known as: VENTOLIN HFA Inhale 2 puffs into the lungs 4 (four) times daily as needed for wheezing or shortness of breath.   ibuprofen 600 MG tablet Commonly known as: ADVIL Take 1 tablet (600 mg total) by mouth every 6 (six) hours as needed.   metoprolol succinate 25 MG 24 hr tablet Commonly known as: TOPROL-XL Take 0.5 tablets (12.5 mg total) by mouth at bedtime. What changed: when to take this   prenatal multivitamin Tabs tablet Take 1 tablet by mouth daily.        Discharge home in stable condition Infant Feeding: Bottle and Breast Infant Disposition:home with  mother Discharge instruction: per After Visit Summary and Postpartum booklet. Activity: Advance as tolerated. Pelvic rest for 6 weeks.  Diet: routine diet Future Appointments: Future Appointments  Date Time Provider [place]  03/17/2021 11:30 AM Myrtis Ser, CNM [facility]  04/21/2021  2:00 PM Satira Sark, MD [facility]     02/12/2021 Myrtis Ser, CNM  8:54 AM

## 2021-02-13 DIAGNOSIS — Z029 Encounter for administrative examinations, unspecified: Secondary | ICD-10-CM | POA: Diagnosis not present

## 2021-02-16 ENCOUNTER — Other Ambulatory Visit: Payer: 59 | Admitting: Obstetrics and Gynecology

## 2021-03-06 ENCOUNTER — Telehealth: Payer: Self-pay | Admitting: Women's Health

## 2021-03-06 NOTE — Telephone Encounter (Signed)
Pt states she's feeling depressed, states she has a history of depression & anxiety Thought she'd start to feel better but her newborn is 63 weeks old & symptoms aren't improving Wonders if this is something we can treat or should she seen her PCP?  Please advise & call pt

## 2021-03-07 ENCOUNTER — Ambulatory Visit: Payer: 59 | Admitting: Obstetrics & Gynecology

## 2021-03-07 ENCOUNTER — Encounter: Payer: Self-pay | Admitting: Obstetrics & Gynecology

## 2021-03-07 ENCOUNTER — Other Ambulatory Visit: Payer: Self-pay

## 2021-03-07 ENCOUNTER — Telehealth: Payer: Self-pay | Admitting: Obstetrics & Gynecology

## 2021-03-07 VITALS — BP 95/59 | HR 65 | Wt 163.0 lb

## 2021-03-07 DIAGNOSIS — T7840XA Allergy, unspecified, initial encounter: Secondary | ICD-10-CM | POA: Diagnosis not present

## 2021-03-07 DIAGNOSIS — F32A Depression, unspecified: Secondary | ICD-10-CM | POA: Diagnosis not present

## 2021-03-07 DIAGNOSIS — F419 Anxiety disorder, unspecified: Secondary | ICD-10-CM

## 2021-03-07 DIAGNOSIS — Z30011 Encounter for initial prescription of contraceptive pills: Secondary | ICD-10-CM

## 2021-03-07 DIAGNOSIS — R42 Dizziness and giddiness: Secondary | ICD-10-CM | POA: Diagnosis not present

## 2021-03-07 MED ORDER — SERTRALINE HCL 50 MG PO TABS: 50.0000 mg | ORAL_TABLET | Freq: Every day | ORAL | 6 refills | Status: DC

## 2021-03-07 MED ORDER — LO LOESTRIN FE 1 MG-10 MCG / 10 MCG PO TABS: 1.0000 | ORAL_TABLET | Freq: Every day | ORAL | 1 refills | Status: DC

## 2021-03-07 MED ORDER — NORETHIN ACE-ETH ESTRAD-FE 1-20 MG-MCG(24) PO TABS: 1.0000 | ORAL_TABLET | Freq: Every day | ORAL | 3 refills | Status: DC

## 2021-03-07 NOTE — Telephone Encounter (Signed)
Dr. Nelda Marseille to send in alternate med.

## 2021-03-07 NOTE — Telephone Encounter (Signed)
Per Izora Gala w/[facility]  - Lo Loestrin Fe is only covered by her insurance after a trial & failure of at least 2 generic oral contraceptives, no options given by insurance  Please advise

## 2021-03-07 NOTE — Patient Instructions (Signed)
HELPFUL CONTACTS:  Phone First. [employer]- available 24/7:  Your local number is: [phone] If you already have a service provider, call them first.  Have Support Come to You. Crisis situations are often best resolved at home. Mobile Crisis Teams are available 24  hours a day in all counties. Professional counselors will speak with you and your family  during a visit. They have an average response time of 2 hours. This service is provided  by: [facility] [phone]  [facility] [place]. [facility] [place], [place], [place] [address] [phone] Monday -- Friday - 8:00 a.m. - 5:00 p.m.  [facility] [address] [place], [place] [address] [phone] [email or web address]  Additional Resources: http://www.cook-miller.com/.pdf National Suicide Physiological scientist or at [employer] [phone]). [email or web address].fr

## 2021-03-07 NOTE — Telephone Encounter (Signed)
Called pt to schedule an appt for pp depression. No answer, left vm. Informed office staff to look for her call.

## 2021-03-07 NOTE — Addendum Note (Signed)
Addended by: Annalee Genta on: 03/07/2021 02:57 PM   Modules accepted: Orders

## 2021-03-07 NOTE — Progress Notes (Addendum)
POSTPARTUM VISIT Patient name: Macayla Ekdahl MRN [id number]  Date of birth: 2001-10-31 Chief Complaint:   Postpartum Care and Depression  History of Present Illness:   Damary Doland is a 20 y.o. G1P1001 s/p NSVD 02/10/2021.  Today she notes that she is feeling very overwhelmed and depressed. Pt notes h/o depression/anxiety in the past- cannot recall what she was on.  Previously seen at [facility].    At home with boyfriend, but doesn't feel like he is very supportive.  Notes anger towards her partner- denies thoughts of harming him or herself.   Edinburgh Postpartum Depression Screening: Positive 18  Edinburgh Postnatal Depression Scale - 03/07/21 1116      Edinburgh Postnatal Depression Scale:  In the Past 7 Days   I have been able to laugh and see the funny side of things. 2    I have looked forward with enjoyment to things. 1    I have blamed myself unnecessarily when things went wrong. 2    I have been anxious or worried for no good reason. 2    I have felt scared or panicky for no good reason. 1    Things have been getting on top of me. 2    I have been so unhappy that I have had difficulty sleeping. 2    I have felt sad or miserable. 2    I have been so unhappy that I have been crying. 3    The thought of harming myself has occurred to me. 1    Edinburgh Postnatal Depression Scale Total 18          Review of Systems:   Pertinent items are noted in HPI Denies Abnormal vaginal discharge w/ itching/odor/irritation, headaches, visual changes, shortness of breath, chest pain, abdominal pain, severe nausea/vomiting, or problems with urination or bowel movements. Pertinent History Reviewed:  Reviewed past medical,surgical, obstetrical and family history.  Reviewed problem list, medications and allergies. OB History  Gravida Para Term Preterm AB Living  1 1 1     1   SAB IAB Ectopic Multiple Live Births        0 1    # Outcome Date GA Lbr Len/2nd Weight Sex  Delivery Anes PTL Lv  1 Term 02/10/21 [redacted]w[redacted]d 00:40 / 04:23 6 lb 10 oz (3.005 kg) M Vag-Spont EPI  LIV   Physical Assessment:   Vitals:   03/07/21 1114  BP: (!) 95/59  Pulse: 65  Weight: 163 lb (73.9 kg)  Body mass index is 27.12 kg/m.       Physical Examination:   General appearance: alert, well appearing, and in no distress  Mental status: alert, oriented to person, place, and time  Psych: tearful but appropriate  Skin: warm & dry   Cardiovascular: normal heart rate noted   Respiratory: normal respiratory effort, no distress   Ext: no edema  Chaperone: N/A         No results found for this or any previous visit (from the past 24 hour(s)).  Assessment & Plan:  1) Postpartum depression -plan to start on zoloft 50mg  daily -advised pt to call [facility] for follow up -Crisis information given to patient and encouarged to follow up with any concerns  2) Contraceptive management- plan to start on OCPs now Not breastfeeding and wishes to stop lactating -Loloestrin not covered- will transition to different rx  OCP risk assessment: Pt denies personal history of VTE, stroke or heart attack.  Denies personal h/o breast  cancer.  Pt is either a non-smoker or smoker under the age of 22yo.  Denies h/o migraines with aura   Meds:  Meds ordered this encounter  Medications  . sertraline (ZOLOFT) 50 MG tablet    Sig: Take 1 tablet (50 mg total) by mouth daily.    Dispense:  30 tablet    Refill:  6  . Norethindrone-Ethinyl Estradiol-Fe Biphas (LO LOESTRIN FE) 1 MG-10 MCG / 10 MCG tablet    Sig: Take 1 tablet by mouth daily.    Dispense:  90 tablet    Refill:  1    Follow-up: as scheduled later this month  Janyth Pupa, DO Attending [place], [facility] for [employer], [place]

## 2021-03-07 NOTE — Telephone Encounter (Signed)
Pt returned the call and made an appt to be seen.

## 2021-03-10 DIAGNOSIS — F419 Anxiety disorder, unspecified: Secondary | ICD-10-CM | POA: Diagnosis not present

## 2021-03-10 DIAGNOSIS — Z6827 Body mass index (BMI) 27.0-27.9, adult: Secondary | ICD-10-CM | POA: Diagnosis not present

## 2021-03-13 ENCOUNTER — Telehealth: Payer: Self-pay

## 2021-03-13 DIAGNOSIS — R531 Weakness: Secondary | ICD-10-CM | POA: Diagnosis not present

## 2021-03-13 DIAGNOSIS — R55 Syncope and collapse: Secondary | ICD-10-CM | POA: Diagnosis not present

## 2021-03-13 NOTE — Telephone Encounter (Signed)
Hal Hope nurse  with the [facility] called and said she did a visit with the pt and scored a 17/30 on the Endinburgh, she also said that the pt was only taking half of the medication that she was prescribed for her depression (Zoloft ) and stated that the pt "feels overwhelmed".

## 2021-03-13 NOTE — Telephone Encounter (Signed)
Sent message to patient to check on her.

## 2021-03-17 ENCOUNTER — Ambulatory Visit (INDEPENDENT_AMBULATORY_CARE_PROVIDER_SITE_OTHER): Payer: 59 | Admitting: Advanced Practice Midwife

## 2021-03-17 ENCOUNTER — Other Ambulatory Visit: Payer: Self-pay

## 2021-03-17 ENCOUNTER — Encounter: Payer: Self-pay | Admitting: *Deleted

## 2021-03-17 ENCOUNTER — Encounter: Payer: Self-pay | Admitting: Advanced Practice Midwife

## 2021-03-17 DIAGNOSIS — F53 Postpartum depression: Secondary | ICD-10-CM

## 2021-03-17 DIAGNOSIS — O99345 Other mental disorders complicating the puerperium: Secondary | ICD-10-CM | POA: Diagnosis not present

## 2021-03-17 DIAGNOSIS — Z8659 Personal history of other mental and behavioral disorders: Secondary | ICD-10-CM | POA: Insufficient documentation

## 2021-03-17 NOTE — Progress Notes (Signed)
POSTPARTUM VISIT Patient name: Vickie Sutton MRN [id number]  Date of birth: Nov 23, 2001 Chief Complaint:   Postpartum Care  History of Present Illness:   Vickie Sutton is a 20 y.o. G34P1001 Hispanic female being seen today for a postpartum visit. She is 5 weeks postpartum following a spontaneous vaginal delivery at 39.3 gestational weeks. IOL: No. Anesthesia: epidural.  Laceration: R labial repair.  Complications: none. Inpatient contraception: no.   Pregnancy complicated by POTS (Toprol); hx gastic bypass. Tobacco use: no. Substance use disorder: no. Last pap smear: never (turned 19 in Aug 2021) Next pap smear due: this year No LMP recorded.  Postpartum course has been complicated by postpartum depression, started on Zoloft 77m last week. Bleeding thin lochia. Bowel function is constipated at times- takes stool softener daily; sometimes hemorrhoid will bleed and is using Tucks for this. Bladder function is normal. Urinary incontinence? Yes- feels it is only a few drops; fecal incontinence? No Patient is not sexually active. Last sexual activity: prior to delivery.  Desired contraception: OCPs. Patient does not know about a pregnancy in the future.  Desired family size is unsure about the number of children.   Upstream - 03/17/21 1136      Pregnancy Intention Screening   Does the patient want to become pregnant in the next year? No    Does the patient's partner want to become pregnant in the next year? No    Would the patient like to discuss contraceptive options today? No      Contraception Wrap Up   Current Method Oral Contraceptive    End Method Oral Contraceptive    Contraception Counseling Provided No          The pregnancy intention screening data noted above was reviewed. The patient has already elected to proceed with Oral Contraceptive.   Edinburgh Postpartum Depression Screening: Positive , started Zoloft 557mlast week and is in communication with [facility] PCP at  [facility] will refer her to counseling prn  Edinburgh Postnatal Depression Scale - 03/17/21 1135      Edinburgh Postnatal Depression Scale:  In the Past 7 Days   I have been able to laugh and see the funny side of things. 1    I have looked forward with enjoyment to things. 1    I have blamed myself unnecessarily when things went wrong. 1    I have been anxious or worried for no good reason. 2    I have felt scared or panicky for no good reason. 0    Things have been getting on top of me. 2    I have been so unhappy that I have had difficulty sleeping. 1    I have felt sad or miserable. 2    I have been so unhappy that I have been crying. 1    The thought of harming myself has occurred to me. 1    Edinburgh Postnatal Depression Scale Total 12          Baby's course has been uncomplicated. Baby is feeding by bottle. Infant has a pediatrician/family doctor? Yes.  Childcare strategy if returning to work/school: didn't discuss.  Pt has material needs met for her and baby: Yes.   Review of Systems:   Pertinent items are noted in HPI Denies Abnormal vaginal discharge w/ itching/odor/irritation, headaches, visual changes, shortness of breath, chest pain, abdominal pain, severe nausea/vomiting, or problems with urination or bowel movements. Pertinent History Reviewed:  Reviewed past medical,surgical, obstetrical and  family history.  Reviewed problem list, medications and allergies. OB History  Gravida Para Term Preterm AB Living  _0 SAB IAB Ectopic Multiple Live Births        0 1    # Outcome Date GA Lbr Len/2nd Weight Sex Delivery Anes PTL Lv  1 Term 02/10/21 64w3d00:40 / 04:23 6 lb 10 oz (3.005 kg) M Vag-Spont EPI  LIV   Physical Assessment:   Vitals:   03/17/21 1139  BP: 100/60  Pulse: 61  Weight: 163 lb (73.9 kg)  Body mass index is 27.12 kg/m.       Physical Examination:   General appearance: alert, well appearing, and in no distress  Mental status: alert,  oriented to person, place, and time  Skin: warm & dry   Cardiovascular: normal heart rate noted   Respiratory: normal respiratory effort, no distress   Breasts: deferred, no complaints   Abdomen: soft, non-tender   Pelvic: normal external genitalia, vulva, vagina, cervix, uterus and adnexa; bleeding present  Rectal: sm hemorrhoids  Extremities: no edema        No results found for this or any previous visit (from the past 24 hour(s)).  Assessment & Plan:  1) Postpartum exam 2) Five wks s/p spontaneous vaginal delivery 3) bottle feeding 4) Depression screening- positive, on Zoloft 513mx 1wk, stable but not improving yet; will keep us/PCP updated 5) Contraception management- started LoLoestrin last week 6) Sm amt urinary leaking- declines PT referral today but will call if desires  Essential components of care per ACOG recommendations:  1.  Mood and well being:  . If positive depression screen, discussed and plan developed.  . If using tobacco we discussed reduction/cessation and risk of relapse . If current substance abuse, we discussed and referral to local resources was offered.   2. Infant care and feeding:  . If breastfeeding, discussed returning to work, pumping, breastfeeding-associated pain, guidance regarding return to fertility while lactating if not using another method. If needed, patient was provided with a letter to be allowed to pump q 2-3hrs to support lactation in a private location with access to a refrigerator to store breastmilk.   . Recommended that all caregivers be immunized for flu, pertussis and other preventable communicable diseases . If pt does not have material needs met for her/baby, referred to local resources for help obtaining these.  3. Sexuality, contraception and birth spacing . Provided guidance regarding sexuality, management of dyspareunia, and resumption of intercourse . Discussed avoiding interpregnancy interval <43m3m and recommended birth  spacing of 18 months  4. Sleep and fatigue . Discussed coping options for fatigue and sleep disruption . Encouraged family/partner/community support of 4 hrs of uninterrupted sleep to help with mood and fatigue  5. Physical recovery  . If pt had a C/S, assessed incisional pain and providing guidance on normal vs prolonged recovery . If pt had a laceration, perineal healing and pain reviewed.  . If urinary or fecal incontinence, discussed management and referred to PT or uro/gyn if indicated  . Patient is safe to resume physical activity. Discussed attainment of healthy weight.  6.  Chronic disease management . Discussed pregnancy complications if any, and their implications for future childbearing and long-term maternal health. . Review recommendations for prevention of recurrent pregnancy complications, such as 17 hydroxyprogesterone caproate to reduce risk for recurrent PTB no, or aspirin to reduce risk of preeclampsia no. . Pt had GDM: No. If yes,  2hr GTT scheduled: not applicable. . Reviewed medications and non-pregnant dosing including consideration of whether pt is breastfeeding using a reliable resource such as LactMed: not applicable . Referred for f/u w/ PCP or subspecialist providers as indicated: has PCP already  7. Health maintenance . Mammogram at 20yo or earlier if indicated . Pap smears as indicated- needs to have her first one; does not want today  Meds: No orders of the defined types were placed in this encounter.   Follow-up: Return in about 3 months (around 06/17/2021) for Pap & Physical; f/u contraception.   No orders of the defined types were placed in this encounter.   Myrtis Ser CNM 03/17/2021 1:28 PM

## 2021-03-20 ENCOUNTER — Telehealth: Payer: Self-pay

## 2021-03-20 NOTE — Telephone Encounter (Signed)
Pt is needing forms sent in for her short term disability showing that she has postpartum depression sent in so that she can get paid until she goes back to work on May 5th, 2022.

## 2021-03-22 DIAGNOSIS — K649 Unspecified hemorrhoids: Secondary | ICD-10-CM | POA: Diagnosis not present

## 2021-03-29 DIAGNOSIS — F419 Anxiety disorder, unspecified: Secondary | ICD-10-CM | POA: Diagnosis not present

## 2021-04-07 DIAGNOSIS — L987 Excessive and redundant skin and subcutaneous tissue: Secondary | ICD-10-CM | POA: Diagnosis not present

## 2021-04-07 DIAGNOSIS — Z9884 Bariatric surgery status: Secondary | ICD-10-CM | POA: Diagnosis not present

## 2021-04-07 DIAGNOSIS — K649 Unspecified hemorrhoids: Secondary | ICD-10-CM | POA: Diagnosis not present

## 2021-04-07 DIAGNOSIS — K5900 Constipation, unspecified: Secondary | ICD-10-CM | POA: Diagnosis not present

## 2021-04-07 DIAGNOSIS — K912 Postsurgical malabsorption, not elsewhere classified: Secondary | ICD-10-CM | POA: Diagnosis not present

## 2021-04-07 DIAGNOSIS — Z48815 Encounter for surgical aftercare following surgery on the digestive system: Secondary | ICD-10-CM | POA: Diagnosis not present

## 2021-04-07 DIAGNOSIS — Z713 Dietary counseling and surveillance: Secondary | ICD-10-CM | POA: Diagnosis not present

## 2021-04-17 ENCOUNTER — Encounter: Payer: Self-pay | Admitting: Cardiology

## 2021-04-17 ENCOUNTER — Other Ambulatory Visit: Payer: Self-pay

## 2021-04-17 ENCOUNTER — Ambulatory Visit (INDEPENDENT_AMBULATORY_CARE_PROVIDER_SITE_OTHER): Payer: 59 | Admitting: Cardiology

## 2021-04-17 VITALS — BP 116/74 | HR 84 | Ht 65.0 in | Wt 165.0 lb

## 2021-04-17 DIAGNOSIS — G90A Postural orthostatic tachycardia syndrome (POTS): Secondary | ICD-10-CM

## 2021-04-17 DIAGNOSIS — I498 Other specified cardiac arrhythmias: Secondary | ICD-10-CM

## 2021-04-17 NOTE — Patient Instructions (Signed)
Medication Instructions:     Take Toprol in the morning   *If you need a refill on your cardiac medications before your next appointment, please call your pharmacy*   Lab Work: None today  If you have labs (blood work) drawn today and your tests are completely normal, you will receive your results only by: Marland Kitchen MyChart Message (if you have MyChart) OR . A paper copy in the mail If you have any lab test that is abnormal or we need to change your treatment, we will call you to review the results.   Testing/Procedures: None today   Follow-Up: At [facility], you and your health needs are our priority.  As part of our continuing mission to provide you with exceptional heart care, we have created designated Provider Care Teams.  These Care Teams include your primary Cardiologist (physician) and Advanced Practice Providers (APPs -  Physician Assistants and Nurse Practitioners) who all work together to provide you with the care you need, when you need it.  We recommend signing up for the patient portal called "MyChart".  Sign up information is provided on this After Visit Summary.  MyChart is used to connect with patients for Virtual Visits (Telemedicine).  Patients are able to view lab/test results, encounter notes, upcoming appointments, etc.  Non-urgent messages can be sent to your provider as well.   To learn more about what you can do with MyChart, go to [email or web address].    Your next appointment:   3 month(s)  The format for your next appointment:   In Person  Provider:   Rozann Lesches, MD   Other Instructions None

## 2021-04-17 NOTE — Progress Notes (Signed)
Cardiology Office Note  Date: 04/17/2021   ID: Aleigh, Grunden 05/03/2001, MRN [id number]  PCP:  Rosalee Kaufman, PA-C  Cardiologist:  Rozann Lesches, MD Electrophysiologist:  None   Chief Complaint  Patient presents with  . Cardiac follow-up    History of Present Illness: Vickie Sutton is a 20 y.o. female last seen in January.  She is here for a follow-up visit.  She delivered her son without complications in the interim, he is here with her today.  She states that she has been getting enough rest, eating well, also hydrating.  Still has spells of lightheadedness as before, not all associated with tachycardia or palpitations however.  She has been taking Toprol-XL 12.5 mg once in the evening, we discussed moving this to the morning which is what we had done prior to her pregnancy.  Past Medical History:  Diagnosis Date  . ADD (attention deficit disorder)   . Anxiety   . Asthma   . Depression   . Migraine   . Panic attacks   . Polycystic ovarian syndrome   . POTS (postural orthostatic tachycardia syndrome)     Past Surgical History:  Procedure Laterality Date  . ESOPHAGOGASTRODUODENOSCOPY    . LAPAROTOMY N/A 09/07/2020   Procedure: EXPLORATORY LAPAROTOMY WITH EXTRACTION  OVARIAN CYST;  Surgeon: Florian Buff, MD;  Location: [place];  Service: Gynecology;  Laterality: N/A;  . ROUX-EN-Y GASTRIC BYPASS     August 2020 - [facility]  . TOOTH EXTRACTION N/A 12/12/2015   Procedure: EXTRACTION MOLARS - teeth one, sixteen, seventeen and thirty-two;  Surgeon: Diona Browner, DDS;  Location: [place];  Service: Oral Surgery;  Laterality: N/A;    Current Outpatient Medications  Medication Sig Dispense Refill  . albuterol (VENTOLIN HFA) 108 (90 Base) MCG/ACT inhaler Inhale 2 puffs into the lungs 4 (four) times daily as needed for wheezing or shortness of breath.    . metoprolol succinate (TOPROL-XL) 25 MG 24 hr tablet Take 12.5 mg by mouth daily.    . Norethindrone  Acetate-Ethinyl Estrad-FE (LOESTRIN 24 FE) 1-20 MG-MCG(24) tablet Take 1 tablet by mouth daily. 90 tablet 3  . Prenatal Vit-Fe Fumarate-FA (PRENATAL MULTIVITAMIN) TABS tablet Take 1 tablet by mouth daily.     . sertraline (ZOLOFT) 50 MG tablet Take 1 tablet (50 mg total) by mouth daily. (Patient taking differently: Take 25 mg by mouth daily.) 30 tablet 6   No current facility-administered medications for this visit.   Allergies:  Patient has no known allergies.   ROS: No orthopnea or PND.  Physical Exam: VS:  BP 116/74   Pulse 84   Ht 5\' 5"  (1.651 m)   Wt 165 lb (74.8 kg)   SpO2 96%   BMI 27.46 kg/m , BMI Body mass index is 27.46 kg/m.  Wt Readings from Last 3 Encounters:  04/17/21 165 lb (74.8 kg)  03/17/21 163 lb (73.9 kg)  03/07/21 163 lb (73.9 kg)    General: Patient appears comfortable at rest. HEENT: Conjunctiva and lids normal, wearing a mask. Lungs: Clear to auscultation, nonlabored breathing at rest. Cardiac: Regular rate and rhythm, no S3 or significant systolic murmur, no pericardial rub. Extremities: No pitting edema.  ECG:  An ECG dated 11/28/2020 was personally reviewed today and demonstrated:  Sinus rhythm with nonspecific ST changes.  Recent Labwork: 11/28/2020: ALT 15; AST 19; BUN 6; Creatinine, Ser 0.46; Magnesium 1.6; Potassium 4.2; Sodium 134 02/09/2021: Hemoglobin 12.0; Platelets 237   Other Studies Reviewed  Today:  Echocardiogram2/16/2021: :1. Left ventricular ejection fraction, by estimation, is 55%. The left  ventricle has normal function. The left ventricle has no regional wall  motion abnormalities. Left ventricular diastolic parameters were normal.  2. Right ventricular systolic function is normal. The right ventricular  size is normal. There is normal pulmonary artery systolic pressure. The  estimated right ventricular systolic pressure is 55.7 mmHg.  3. The mitral valve is grossly normal. No evidence of mitral valve  regurgitation.  4.  The aortic valve is tricuspid. Aortic valve regurgitation is not  visualized.  5. The inferior vena cava is normal in size with greater than 50%  respiratory variability, suggesting right atrial pressure of 3 mmHg.   Assessment and Plan:  Autonomic dysfunction and possible POTS, although not all symptoms are necessarily consistent with this.  We have recommended conservative measures including hydration, compression stockings, also low-dose Toprol-XL 12.5 mg daily.  We will move this dose to the morning at this point.  Continue with efforts at maintaining a regular exercise plan.  Plan to follow-up with orthostatic measurements at her next visit.  Medication Adjustments/Labs and Tests Ordered: Current medicines are reviewed at length with the patient today.  Concerns regarding medicines are outlined above.   Tests Ordered: No orders of the defined types were placed in this encounter.   Medication Changes: No orders of the defined types were placed in this encounter.   Disposition:  Follow up 3 months.  Signed, Satira Sark, MD, [facility] 04/17/2021 2:08 PM    [facility] [address] [place], [place] [address] Phone: [phone]; Fax: [phone]

## 2021-04-21 ENCOUNTER — Ambulatory Visit: Payer: 59 | Admitting: Cardiology

## 2021-05-05 ENCOUNTER — Emergency Department (HOSPITAL_COMMUNITY)
Admission: EM | Admit: 2021-05-05 | Discharge: 2021-05-05 | Disposition: A | Discharge status: Home / Self Care | Payer: 59 | Attending: Emergency Medicine | Admitting: Emergency Medicine

## 2021-05-05 ENCOUNTER — Encounter (HOSPITAL_COMMUNITY): Payer: Self-pay | Admitting: *Deleted

## 2021-05-05 DIAGNOSIS — R002 Palpitations: Secondary | ICD-10-CM | POA: Diagnosis not present

## 2021-05-05 DIAGNOSIS — Z79899 Other long term (current) drug therapy: Secondary | ICD-10-CM | POA: Insufficient documentation

## 2021-05-05 DIAGNOSIS — R42 Dizziness and giddiness: Secondary | ICD-10-CM | POA: Diagnosis not present

## 2021-05-05 DIAGNOSIS — R000 Tachycardia, unspecified: Secondary | ICD-10-CM | POA: Insufficient documentation

## 2021-05-05 DIAGNOSIS — F410 Panic disorder [episodic paroxysmal anxiety] without agoraphobia: Secondary | ICD-10-CM | POA: Insufficient documentation

## 2021-05-05 DIAGNOSIS — J45909 Unspecified asthma, uncomplicated: Secondary | ICD-10-CM | POA: Insufficient documentation

## 2021-05-05 LAB — CBC WITH DIFFERENTIAL/PLATELET
Abs Immature Granulocytes: 0.01 10*3/uL (ref 0.00–0.07)
Basophils Absolute: 0 10*3/uL (ref 0.0–0.1)
Basophils Relative: 0 %
Eosinophils Absolute: 0.1 10*3/uL (ref 0.0–0.5)
Eosinophils Relative: 1 %
HCT: 37.5 % (ref 36.0–46.0)
Hemoglobin: 12.2 g/dL (ref 12.0–15.0)
Immature Granulocytes: 0 %
Lymphocytes Relative: 26 %
Lymphs Abs: 1.6 10*3/uL (ref 0.7–4.0)
MCH: 29.3 pg (ref 26.0–34.0)
MCHC: 32.5 g/dL (ref 30.0–36.0)
MCV: 89.9 fL (ref 80.0–100.0)
Monocytes Absolute: 0.5 10*3/uL (ref 0.1–1.0)
Monocytes Relative: 8 %
Neutro Abs: 4.1 10*3/uL (ref 1.7–7.7)
Neutrophils Relative %: 65 %
Platelets: 221 10*3/uL (ref 150–400)
RBC: 4.17 MIL/uL (ref 3.87–5.11)
RDW: 13.7 % (ref 11.5–15.5)
WBC: 6.3 10*3/uL (ref 4.0–10.5)
nRBC: 0 % (ref 0.0–0.2)

## 2021-05-05 LAB — RAPID URINE DRUG SCREEN, HOSP PERFORMED
Amphetamines: NOT DETECTED
Barbiturates: NOT DETECTED
Benzodiazepines: NOT DETECTED
Cocaine: NOT DETECTED
Opiates: NOT DETECTED
Tetrahydrocannabinol: NOT DETECTED

## 2021-05-05 LAB — BASIC METABOLIC PANEL
Anion gap: 5 (ref 5–15)
BUN: 12 mg/dL (ref 6–20)
CO2: 29 mmol/L (ref 22–32)
Calcium: 9.1 mg/dL (ref 8.9–10.3)
Chloride: 107 mmol/L (ref 98–111)
Creatinine, Ser: 0.6 mg/dL (ref 0.44–1.00)
GFR, Estimated: >60 mL/min (ref >60)
Glucose, Bld: 77 mg/dL (ref 70–99)
Potassium: 3.7 mmol/L (ref 3.5–5.1)
Sodium: 141 mmol/L (ref 135–145)

## 2021-05-05 LAB — TSH: TSH: 1.4 u[IU]/mL (ref 0.350–4.500)

## 2021-05-05 LAB — PREGNANCY, URINE: Preg Test, Ur: NEGATIVE

## 2021-05-05 NOTE — ED Triage Notes (Signed)
States she had a panic attack while working today, history of same

## 2021-05-05 NOTE — ED Provider Notes (Signed)
Emergency Medicine Provider Triage Evaluation Note  Vickie Sutton , a 20 y.o. female  was evaluated in triage.  Pt complains of rapid heart beat. She has a history of POTS, anxiety and panic attacks (treated with lorazapam). 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 She had to sit down and breath into a bag. Reports her heart rate was 160-180s. She felt lightheaded and had chest pain for a few minutes. The chest pain resolved.   Review of Systems  Positive: Palpitations, anxiety, SOB, chest pain Negative: Nausea, vomiting, syncope   Physical Exam  BP 114/76   Pulse 87   Temp 98.4 F (36.9 C) (Oral)   Resp 20   LMP 04/28/2021   SpO2 100%  Gen:   Awake, no distress   Resp:  Normal effort  MSK:   Moves extremities without difficulty. Other:  S1S2  Medical Decision Making  Medically screening exam initiated at 3:03 PM.  Appropriate orders placed.  Aliene Altes Gramajo was informed that the remainder of the evaluation will be completed by another provider, this initial triage assessment does not replace that evaluation, and the importance of remaining in the ED until their evaluation is complete.     Sherrill Raring, PA-C 05/05/21 1505    Elnora Morrison, MD 05/06/21 [phone]

## 2021-05-05 NOTE — ED Provider Notes (Signed)
[facility] EMERGENCY DEPARTMENT Provider Note   CSN: [id number] Arrival date & time: 05/05/21  1437     History Chief Complaint  Patient presents with  . Panic Attack    Vickie Sutton is a 20 y.o. female.  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 There was no clear trigger. She felt her heart acutely start racing for a few minutes. She reports her HR was 160-180s. She had chest pain during the episode. She also had to sit down and breath into a bag. She currently denies chest pain but feels lightheaded. She states this does not feel like a normal panic attack to her.   Reports she usually takes lorazapam daily for panic attacks. She had her medicine after feeling tachycardic today.   Past Medical History:  Diagnosis Date  . ADD (attention deficit disorder)   . Anxiety   . Asthma   . Depression   . Migraine   . Panic attacks   . Polycystic ovarian syndrome   . POTS (postural orthostatic tachycardia syndrome)     Patient Active Problem List   Diagnosis Date Noted  . Postpartum depression 03/17/2021  . History of Roux-en-Y gastric bypass 08/08/2020  . PCOS (polycystic ovarian syndrome) 08/08/2020  . Anxiety 08/08/2020  . Syncope 07/25/2020  . POTS (postural orthostatic tachycardia syndrome) 05/03/2020  . Asthma 03/23/2019    Past Surgical History:  Procedure Laterality Date  . ESOPHAGOGASTRODUODENOSCOPY    . LAPAROTOMY N/A 09/07/2020   Procedure: EXPLORATORY LAPAROTOMY WITH EXTRACTION  OVARIAN CYST;  Surgeon: Florian Buff, MD;  Location: [place];  Service: Gynecology;  Laterality: N/A;  . ROUX-EN-Y GASTRIC BYPASS     August 2020 - [facility]  . TOOTH EXTRACTION N/A 12/12/2015   Procedure: EXTRACTION MOLARS - teeth one, sixteen, seventeen and thirty-two;  Surgeon: Diona Browner, DDS;  Location: [place];  Service:  Oral Surgery;  Laterality: N/A;     OB History    Gravida  1   Para  1   Term  1   Preterm      AB      Living  1     SAB      IAB      Ectopic      Multiple  0   Live Births  1           Family History  Problem Relation Age of Onset  . Obesity Mother   . Diabetes Mellitus II Maternal Grandmother   . Heart disease Maternal Grandmother   . Yves Dill Parkinson White syndrome Brother   . Seizures Maternal Aunt     Social History   Tobacco Use  . Smoking status: Never Smoker  . Smokeless tobacco: Never Used  Vaping Use  . Vaping Use: Never used  Substance Use Topics  . Alcohol use: No  . Drug use: No    Home Medications Prior to Admission medications   Medication Sig Start Date End Date Taking? Authorizing Provider  albuterol (VENTOLIN HFA) 108 (90 Base) MCG/ACT inhaler Inhale 2 puffs into the lungs 4 (four) times daily as needed for wheezing or shortness of breath.   Yes [provider]  metoprolol succinate (TOPROL-XL) 25 MG 24 hr tablet Take 12.5 mg by mouth daily.   Yes [provider]  Norethindrone Acetate-Ethinyl Estrad-FE (LOESTRIN  24 FE) 1-20 MG-MCG(24) tablet Take 1 tablet by mouth daily. 03/07/21 06/05/21 Yes Janyth Pupa, DO  Prenatal Vit-Fe Fumarate-FA (PRENATAL MULTIVITAMIN) TABS tablet Take 1 tablet by mouth daily.    Yes [provider]  sertraline (ZOLOFT) 50 MG tablet Take 1 tablet (50 mg total) by mouth daily. Patient taking differently: Take 25 mg by mouth daily. 03/07/21 04/06/21 Yes Janyth Pupa, DO    Allergies    Patient has no known allergies.  Review of Systems   Review of Systems  Constitutional: Negative for chills and fever.  HENT: Negative for ear pain and sore throat.   Eyes: Negative for pain and visual disturbance.  Respiratory: Negative for cough and shortness of breath.   Cardiovascular: Positive for chest pain and palpitations.  Gastrointestinal: Negative for abdominal pain and vomiting.   Genitourinary: Negative for dysuria and hematuria.  Musculoskeletal: Negative for arthralgias and back pain.  Skin: Negative for color change and rash.  Neurological: Negative for seizures and syncope.  Psychiatric/Behavioral: The patient is nervous/anxious.   All other systems reviewed and are negative.   Physical Exam Updated Vital Signs BP 114/76   Pulse 87   Temp 98.4 F (36.9 C) (Oral)   Resp 20   LMP 04/28/2021   SpO2 100%   Physical Exam Vitals and nursing note reviewed. Exam conducted with a chaperone present.  Constitutional:      Appearance: Normal appearance.  HENT:     Head: Normocephalic and atraumatic.  Eyes:     General: No scleral icterus.       Right eye: No discharge.        Left eye: No discharge.     Extraocular Movements: Extraocular movements intact.     Pupils: Pupils are equal, round, and reactive to light.  Cardiovascular:     Rate and Rhythm: Normal rate and regular rhythm.     Pulses: Normal pulses.     Heart sounds: Normal heart sounds. No murmur heard. No friction rub. No gallop.   Pulmonary:     Effort: Pulmonary effort is normal. No respiratory distress.     Breath sounds: Normal breath sounds.     Comments: Speaking in complete sentences Abdominal:     General: Abdomen is flat. Bowel sounds are normal. There is no distension.     Palpations: Abdomen is soft.     Tenderness: There is no abdominal tenderness.  Skin:    General: Skin is warm and dry.     Coloration: Skin is not jaundiced.  Neurological:     Mental Status: She is alert. Mental status is at baseline.     Coordination: Coordination normal.     ED Results / Procedures / Treatments   Labs (all labs ordered are listed, but only abnormal results are displayed) Labs Reviewed  TSH  BASIC METABOLIC PANEL  CBC WITH DIFFERENTIAL/PLATELET  RAPID URINE DRUG SCREEN, HOSP PERFORMED    EKG None  Radiology No results found.  Procedures Procedures   Medications  Ordered in ED Medications - No data to display  ED Course  I have reviewed the triage vital signs and the nursing notes.  Pertinent labs & imaging results that were available during my care of the patient were reviewed by me and considered in my medical decision making (see chart for details).  Clinical Course as of 05/05/21 1757  Fri May 05, 2021  1732 TSH: 1.400 [HS]  1756 Tetrahydrocannabinol: NONE DETECTED [HS]    Clinical Course User Index [  HS] Sherrill Raring, PA-C   MDM Rules/Calculators/A&P                         Vitals stable, PE reassuring. EKG shows normal sinus rhythm. Does not rule out that she had an arrhythmia earlier, but no signs of SVT or VT while on monitor during ED visit.  CBC: No anemia. No signs of infection (no elevated WBC) BMP: no electrolyte derangement TSH: Normal limits. No signs of thyroid storm  UDS: negative for substances   Doubt ACS given age, lack of risk factors, and lack of EKG findings.Doubt dehydration given lack of findings on lab work. Doubt PTX given clear lung sounds. Doubt AAA given age and lack of risk factors.  Suspect transient arrhythmia vs. Panic attack.   Patient already has a cardiologist she sees. I advised her make an appointment to follow up with him this week for further evaluation.   Discussed HPI, physical exam and plan of care for this patient with attending Ankit Nanavati. The attending physician evaluated this patient as part of a shared visit and agrees with plan of care.   Final Clinical Impression(s) / ED Diagnoses Final diagnoses:  None    Rx / DC Orders ED Discharge Orders    None       Sherrill Raring, PA-C 05/05/21 [place], Ankit, MD 05/12/21 1514

## 2021-05-05 NOTE — Discharge Instructions (Signed)
Please follow up with your cardiologist for a heart monitor. Your labs were reassuring. No signs of ACS. If you feel worse please come back. Otherwise, we saw no signs of thyroid disease or ACS.

## 2021-06-16 ENCOUNTER — Encounter: Payer: Self-pay | Admitting: Women's Health

## 2021-06-16 ENCOUNTER — Ambulatory Visit (INDEPENDENT_AMBULATORY_CARE_PROVIDER_SITE_OTHER): Payer: 59 | Admitting: Women's Health

## 2021-06-16 ENCOUNTER — Other Ambulatory Visit (HOSPITAL_COMMUNITY)
Admission: RE | Admit: 2021-06-16 | Discharge: 2021-06-16 | Disposition: A | Discharge status: Home / Self Care | Payer: 59 | Source: Ambulatory Visit | Attending: Obstetrics & Gynecology | Admitting: Obstetrics & Gynecology

## 2021-06-16 ENCOUNTER — Other Ambulatory Visit: Payer: Self-pay

## 2021-06-16 VITALS — BP 99/60 | HR 69 | Ht 65.0 in | Wt 160.0 lb

## 2021-06-16 DIAGNOSIS — Z0000 Encounter for general adult medical examination without abnormal findings: Secondary | ICD-10-CM | POA: Diagnosis not present

## 2021-06-16 DIAGNOSIS — R238 Other skin changes: Secondary | ICD-10-CM | POA: Diagnosis not present

## 2021-06-16 DIAGNOSIS — Z01419 Encounter for gynecological examination (general) (routine) without abnormal findings: Secondary | ICD-10-CM

## 2021-06-16 DIAGNOSIS — R233 Spontaneous ecchymoses: Secondary | ICD-10-CM

## 2021-06-16 DIAGNOSIS — Z1329 Encounter for screening for other suspected endocrine disorder: Secondary | ICD-10-CM

## 2021-06-16 DIAGNOSIS — F418 Other specified anxiety disorders: Secondary | ICD-10-CM

## 2021-06-16 NOTE — Progress Notes (Signed)
WELL-WOMAN EXAMINATION Patient name: Vickie Sutton MRN [id number]  Date of birth: 12/18/2001 Chief Complaint:   Gynecologic Exam (Follow up on birth control; it's working good!!! Bruises easily)  History of Present Illness:   Vickie Sutton is a 20 y.o. G35P1001 Hispanic female being seen today for a routine well-woman exam. 78mths postpartum, bottlefeeding. Doing well on LoLoestrin rx'd after baby.  Current complaints: bruises easily. Dep/anx on zoloft 50mg , having marital issues/stress, is interested in [facility] referral  PCP: [facility]      Patient's last menstrual period was 05/24/2021. The current method of family planning is OCP (estrogen/progesterone).  Last pap never. Results were: N/A. H/O abnormal pap: no Last mammogram: never. Results were: N/A. Family h/o breast cancer: no Last colonoscopy: never. Results were: N/A. Family h/o colorectal cancer: no  Depression screen [facility] 2/9 06/16/2021 08/08/2020  Decreased Interest 1 0  Down, Depressed, Hopeless 1 0  PHQ - 2 Score 2 0  Altered sleeping 0 0  Tired, decreased energy 2 2  Change in appetite 0 0  Feeling bad or failure about yourself  0 0  Trouble concentrating 0 0  Moving slowly or fidgety/restless 0 0  Suicidal thoughts 0 0  PHQ-9 Score 4 2     GAD 7 : Generalized Anxiety Score 06/16/2021 08/08/2020  Nervous, Anxious, on Edge 1 1  Control/stop worrying 0 0  Worry too much - different things 1 1  Trouble relaxing 0 0  Restless 0 0  Easily annoyed or irritable 1 1  Afraid - awful might happen 0 0  Total GAD 7 Score 3 3     Review of Systems:   Pertinent items are noted in HPI Denies any headaches, blurred vision, fatigue, shortness of breath, chest pain, abdominal pain, abnormal vaginal discharge/itching/odor/irritation, problems with periods, bowel movements, urination, or intercourse unless otherwise stated above. Pertinent History Reviewed:  Reviewed past medical,surgical, social and family history.   Reviewed problem list, medications and allergies. Physical Assessment:   Vitals:   06/16/21 0934  BP: 99/60  Pulse: 69  Weight: 160 lb (72.6 kg)  Height: 5\' 5"  (1.651 m)  Body mass index is 26.63 kg/m.        Physical Examination:   General appearance - well appearing, and in no distress  Mental status - alert, oriented to person, place, and time  Psych:  She has a normal mood and affect  Skin - warm and dry, normal color, no suspicious lesions noted  Chest - effort normal, all lung fields clear to auscultation bilaterally  Heart - normal rate and regular rhythm  Neck:  midline trachea, no thyromegaly or nodules  Breasts - breasts appear normal, no suspicious masses, no skin or nipple changes or  axillary nodes  Abdomen - soft, nontender, nondistended, no masses or organomegaly  Pelvic - VULVA: normal appearing vulva with no masses, tenderness or lesions  VAGINA: normal appearing vagina with normal color and discharge, no lesions  CERVIX: normal appearing cervix without discharge or lesions, no CMT  Thin prep pap is done w/ HR HPV cotesting  UTERUS: uterus is felt to be normal size, shape, consistency and nontender   ADNEXA: No adnexal masses or tenderness noted.  Extremities:  No swelling or varicosities noted  Chaperone: Levy Pupa    No results found for this or any previous visit (from the past 24 hour(s)).  Assessment & Plan:  1) Well-Woman Exam  2) Bruises easily> will check cbc  3) Dep/anx> on zoloft  50mg , current marital issues, doesn't feel dose needs to be increased, is interested in [facility] referral-entered  Labs/procedures today: pap, labs as below  Mammogram: @ 22yo, or sooner if problems Colonoscopy: @ 22yo, or sooner if problems  Orders Placed This Encounter  Procedures   CBC   TSH   Amb ref to [place]: No orders of the defined types were placed in this encounter.   Follow-up: Return in about 1 year (around 06/16/2022) for  Physical.  [place], WHNP-BC 06/16/2021 10:10 AM

## 2021-06-17 LAB — CBC
Hematocrit: 39 % (ref 34.0–46.6)
Hemoglobin: 13 g/dL (ref 11.1–15.9)
MCH: 28.4 pg (ref 26.6–33.0)
MCHC: 33.3 g/dL (ref 31.5–35.7)
MCV: 85 fL (ref 79–97)
Platelets: 228 10*3/uL (ref 150–450)
RBC: 4.57 x10E6/uL (ref 3.77–5.28)
RDW: 12.8 % (ref 11.7–15.4)
WBC: 5.5 10*3/uL (ref 3.4–10.8)

## 2021-06-17 LAB — TSH: TSH: 1.04 u[IU]/mL (ref 0.450–4.500)

## 2021-06-21 LAB — CYTOLOGY - PAP
Chlamydia: NEGATIVE
Comment: NEGATIVE
Comment: NORMAL
Diagnosis: NEGATIVE
Neisseria Gonorrhea: NEGATIVE

## 2021-07-11 ENCOUNTER — Telehealth: Payer: Self-pay | Admitting: Cardiology

## 2021-07-11 DIAGNOSIS — Z6825 Body mass index (BMI) 25.0-25.9, adult: Secondary | ICD-10-CM | POA: Diagnosis not present

## 2021-07-11 DIAGNOSIS — Z9884 Bariatric surgery status: Secondary | ICD-10-CM | POA: Diagnosis not present

## 2021-07-11 DIAGNOSIS — F419 Anxiety disorder, unspecified: Secondary | ICD-10-CM | POA: Diagnosis not present

## 2021-07-11 NOTE — Telephone Encounter (Signed)
Mailbox full

## 2021-07-11 NOTE — Telephone Encounter (Signed)
Returned call to patient.  Stated that she is feeling some better now.  No active chest pains & HR is down to 75.  Stated that she was driving and out of no where - she started feeling palpitations & looked at apple watch - said her heart rate was 170.  States that she feels like the panic attack came after that because is scared her when she saw the elevated number.  States that she does feel really tired now and does have a migraine.  She has been taking her Toprol as prescribed - 12.5mg  every morning.  She does already have OV scheduled with Melina Copa in [place] office on 07/18/2021.

## 2021-07-11 NOTE — Telephone Encounter (Signed)
Patient called stating that she was driving to work and states that her heart started racing. She pulled off the road HR was 170 and started having tightness in chest. She then started hyperventilating. Started having a massive headache, fatigue now.  [phone].

## 2021-07-12 NOTE — Telephone Encounter (Signed)
Mailbox full

## 2021-07-17 NOTE — Telephone Encounter (Signed)
Patient notified and verbalized understanding.  She will keep her appointment as planned for tomorrow with Melina Copa.

## 2021-07-17 NOTE — Progress Notes (Signed)
Virtual Visit via Telephone Note   This visit type was conducted due to patient request due to childcare issues.  Due to her co-morbid illnesses, this patient is at least at moderate risk for complications without adequate follow up.  The patient did not have access to video technology/had technical difficulties with video requiring transitioning to audio format only (telephone).  All issues noted in this document were discussed and addressed.  No physical exam could be performed with this format.  Please refer to the patient's chart for her  consent to telehealth for [facility].   The patient was identified using 2 identifiers.  Date:  07/18/2021   ID:  Vickie, Sutton 10-27-2001, MRN [id number]  Patient Location: Home, requested virtual visit due to child care Provider Location: Office/Clinic  PCP:  Rosalee Kaufman, PA-C  Cardiologist:  Rozann Lesches, MD Electrophysiologist:  None   Evaluation Performed:  Follow-Up Visit  Chief Complaint:  palpitations  History of Present Illness:    Vickie Sutton is a 20 y.o. female with ADD, anxiety, depression, asthma, migraines, panic attacks, PCOS, POTS, gastric bypass surgery in 2020, palpitations who presents for follow-up.  She has a prior history of syncope most often in the context of standing. This occurred even prior to her gastric bypass surgery, so was not specifically a post-operative problem. The first time she established care in our office in 11/2019. Orthostatic VS were normal at the initial encounter, but in later visits was noted to have orthostatic rise in HR felt consistent with POTS - although not all symptoms were necessarily consistent with this. She did wear a cardiac monitor 11/2019-12/2019 showing predominantly NSR, rare PACs/PVCs, episodes of tachycardia - fastest episodes were potentially sinus but could not completely exclude SVT, peak HR 150s-190s. Prior metanephrine/catecholamine testing was  reported to be normal. She has a special needs brother with WPW, diagnosed after episode of syncope. Per Dr. Domenic Polite, she has no history of preexcitation and her QT interval was previously normal. She does report a prior history of syncope while on the treadmill that occurred at higher heart rates, so now tries to be mindful of targeting an exercise HR in the 130-140 range. She also walks 10,000 steps a day regularly (works at [employer]). Additional testing includes 2D echo 01/2020 EF 55%, no significant abnormalities seen. She has been treated with lose dose Toprol.  She presents today virtually for evaluation of palpitations. In general she does not feel her POTS symptoms have been well controlled recently - tends to get orthostasis fairly frequently and has to enact countermeasures to prevent syncope. However, recently she had an episode of tachy-palpitations while driving that really concerned her since she was sitting down at the time. She felt a sensation of chest tightness/SOB prototypical of her previous of palpitations. Her Apple watch indicated a HR 160-170s. These palpitations then triggered a sense of anxiety, followed by lightheadedness, felt like she was moving even though she was sitting still, vision felt off. She carried out mindfulness exercises she was previously suggested to try in the context of panic attacks. The palpitations lasted about 5 minutes in duration before they subsided. No associated syncope. It felt like abrupt onset but more gradual offset. HR was subsequently in the 90s the rest of the day and she felt tired. Looking back she's had 3 total episodes of palpitations in the last 1 month. She had labwork at [place] a few days later and was told her kidneys were off.  She has also noted a 21lb weight loss quicker than intended and early satiety, prompting that visit.   Labwork independently reviewed: 05/2021 TSH, CBC wnl 04/2021 UDS neg, upreg neg, K 3.7, Cr 0.60    Past  Medical History:  Diagnosis Date   ADD (attention deficit disorder)    Anxiety    Asthma    Depression    Migraine    Panic attacks    Polycystic ovarian syndrome    POTS (postural orthostatic tachycardia syndrome)    Past Surgical History:  Procedure Laterality Date   ESOPHAGOGASTRODUODENOSCOPY     LAPAROTOMY N/A 09/07/2020   Procedure: EXPLORATORY LAPAROTOMY WITH EXTRACTION  OVARIAN CYST;  Surgeon: Florian Buff, MD;  Location: [place];  Service: Gynecology;  Laterality: N/A;   ROUX-EN-Y GASTRIC BYPASS     August 2020 - [facility]   TOOTH EXTRACTION N/A 12/12/2015   Procedure: EXTRACTION MOLARS - teeth one, sixteen, seventeen and thirty-two;  Surgeon: Diona Browner, DDS;  Location: [place];  Service: Oral Surgery;  Laterality: N/A;     Current Meds  Medication Sig   albuterol (VENTOLIN HFA) 108 (90 Base) MCG/ACT inhaler Inhale 2 puffs into the lungs 4 (four) times daily as needed for wheezing or shortness of breath.   escitalopram (LEXAPRO) 20 MG tablet Take 20 mg by mouth daily.   metoprolol succinate (TOPROL-XL) 25 MG 24 hr tablet Take 12.5 mg by mouth daily.   Multiple Vitamin (MULTIVITAMIN) tablet Take 1 tablet by mouth daily.   Norethin Ace-Eth Estrad-FE (LOESTRIN 24 FE PO) Take by mouth.     Allergies:   Patient has no known allergies.   Social History   Tobacco Use   Smoking status: Never   Smokeless tobacco: Never  Vaping Use   Vaping Use: Never used  Substance Use Topics   Alcohol use: No   Drug use: No     Family Hx: The patient's family history includes Diabetes Mellitus II in her maternal grandmother; Heart disease in her maternal grandmother; Obesity in her mother; Pneumonia in her son; Seizures in her maternal aunt; Yves Dill Parkinson White syndrome in her brother.  ROS:   Please see the history of present illness.    All other systems reviewed and are negative.   Prior CV studies:   The following studies were reviewed today:  2D Echo 01/2020  1. Left  ventricular ejection fraction, by estimation, is 55%. The left  ventricle has normal function. The left ventricle has no regional wall  motion abnormalities. Left ventricular diastolic parameters were normal.   2. Right ventricular systolic function is normal. The right ventricular  size is normal. There is normal pulmonary artery systolic pressure. The  estimated right ventricular systolic pressure is 123XX123 mmHg.   3. The mitral valve is grossly normal. No evidence of mitral valve  regurgitation.   4. The aortic valve is tricuspid. Aortic valve regurgitation is not  visualized.   5. The inferior vena cava is normal in size with greater than 50%  respiratory variability, suggesting right atrial pressure of 3 mmHg.   Monitor 12/2019 Zio patch reviewed.  11 days 9 hours analyzed.  Predominant rhythm is sinus with heart rate ranging from 44 bpm up to 199 bpm and average heart rate 74 bpm.  Rare PACs and PVCs were noted representing less than 1% of total beats.  Fastest episodes of tachycardia are potentially sinus, although cannot completely exclude SVT.  There does not look to be an abrupt transition from  normal to very rapid heart rate however based on available information.    Labs/Other Tests and Data Reviewed:    EKG:  An ECG dated 05/05/21 was personally reviewed today and demonstrated:  NSR 68bpm, ST upsloping II, III, avF, V3-V6, nonspecific STTW change in V2, no overt pre-exitation noted but does have short PR 181m and inferior Q waves  Recent Labs: 11/28/2020: ALT 15; Magnesium 1.6 05/05/2021: BUN 12; Creatinine, Ser 0.60; Potassium 3.7; Sodium 141 06/16/2021: Hemoglobin 13.0; Platelets 228; TSH 1.040   Recent Lipid Panel No results found for: CHOL, TRIG, HDL, CHOLHDL, LDLCALC, LDLDIRECT  Wt Readings from Last 3 Encounters:  07/18/21 149 lb (67.6 kg)  06/16/21 160 lb (72.6 kg)  04/17/21 165 lb (74.8 kg)     Objective:    Vital Signs:  Ht '5\' 5"'$  (1.651 m)   Wt 149 lb (67.6 kg)    BMI 24.79 kg/m    VS reviewed. General - calm, polite female in no acute distress Pulm - No labored breathing, no coughing during visit, no audible wheezing, speaking in full sentences Neuro - A+Ox3, no slurred speech, answers questions appropriately Psych - Pleasant affect  ASSESSMENT & PLAN:    1. Palpitations - recommend monitor for further evaluation. She does not think a 2-week Zio will be long enough to definitively capture an episode so will plan for 30 day event monitor. I requested nurse obtain copy of labs from [facility] we can review most recent labs. Ideally would be looking for BMET, Mg, TSH, CBC. She feels this episode was somewhat unusual because it happened at rest. It is also a challenging clinical picture because the episodes are concomitantly understandably associated with sense of panic although it is unclear which is the chicken or the egg.  2. POTS - she does not feel orthostasis symptoms are well controlled. Toprol has been as helpful lately. She cannot titrate the dose further due to baseline low BP. We went over the POTS lifestyle guidelines and I sent her these in the form of a Mychart message (see message). I asked her to reply with her BP and HR and it was 108/62 and pulse of 80. We discussed action items like increasing sodium intake, trial of compression hose as outlined and elevating the head of her bed. She does report having recent bloodwork looking at many different levels and was told something might be wrong with her kidneys. Will request a copy of these labs as above. If she had not had any evaluation for adrenal insufficiency, this is something that could be considered. I considered whether perhaps her gastric bypass induced some sort of peripheral neuropathy (ie related to B12/folate issues) but her symptoms pre-dated her surgery including when her weight was 300lb. I will defer to primary care on the workup of her weight loss and early satiety.  3. Family  history of WPW - this is an interesting component of her history. In the setting of her history of palpitations, syncope, EKG, and family history of WPW, I would like to request the opinion of EP consultation. She is agreeable to referral.   Time:   Today, I have spent 15 minutes with the patient with telehealth technology discussing the above problems.     Medication Adjustments/Labs and Tests Ordered: Current medicines are reviewed at length with the patient today.  Testing and concerns regarding medicines are outlined above.    Follow Up:  Refer to EP as above. Also tentatively arrange f/u with Dr. MDomenic Polite  in 3 months.  Signed, Charlie Pitter, PA-C  07/18/2021 3:53 PM    [facility]

## 2021-07-18 ENCOUNTER — Encounter: Payer: Self-pay | Admitting: Physician Assistant

## 2021-07-18 ENCOUNTER — Telehealth: Payer: Self-pay | Admitting: Physician Assistant

## 2021-07-18 ENCOUNTER — Other Ambulatory Visit: Payer: Self-pay

## 2021-07-18 ENCOUNTER — Telehealth (INDEPENDENT_AMBULATORY_CARE_PROVIDER_SITE_OTHER): Payer: 59 | Admitting: Physician Assistant

## 2021-07-18 VITALS — Ht 65.0 in | Wt 149.0 lb

## 2021-07-18 DIAGNOSIS — Z8249 Family history of ischemic heart disease and other diseases of the circulatory system: Secondary | ICD-10-CM

## 2021-07-18 DIAGNOSIS — I498 Other specified cardiac arrhythmias: Secondary | ICD-10-CM

## 2021-07-18 DIAGNOSIS — G90A Postural orthostatic tachycardia syndrome (POTS): Secondary | ICD-10-CM

## 2021-07-18 DIAGNOSIS — R002 Palpitations: Secondary | ICD-10-CM

## 2021-07-18 NOTE — Patient Instructions (Signed)
Medication Instructions:  Your physician recommends that you continue on your current medications as directed. Please refer to the Current Medication list given to you today.  *If you need a refill on your cardiac medications before your next appointment, please call your pharmacy*   Lab Work: None today  If you have labs (blood work) drawn today and your tests are completely normal, you will receive your results only by: [place] (if you have MyChart) OR A paper copy in the mail If you have any lab test that is abnormal or we need to change your treatment, we will call you to review the results.   Testing/Procedures:Your physician has recommended that you wear an event monitor for 30 days. Event monitors are medical devices that record the heart's electrical activity. Doctors most often [place] these monitors to diagnose arrhythmias. Arrhythmias are problems with the speed or rhythm of the heartbeat. The monitor is a small, portable device. You can wear one while you do your normal daily activities. This is usually used to diagnose what is causing palpitations/syncope (passing out).  Follow-Up: At [facility], you and your health needs are our priority.  As part of our continuing mission to provide you with exceptional heart care, we have created designated Provider Care Teams.  These Care Teams include your primary Cardiologist (physician) and Advanced Practice Providers (APPs -  Physician Assistants and Nurse Practitioners) who all work together to provide you with the care you need, when you need it.  We recommend signing up for the patient portal called "MyChart".  Sign up information is provided on this After Visit Summary.  MyChart is used to connect with patients for Virtual Visits (Telemedicine).  Patients are able to view lab/test results, encounter notes, upcoming appointments, etc.  Non-urgent messages can be sent to your provider as well.   To learn more about what you can do  with MyChart, go to [email or web address].    Your next appointment:   3 month(s)  The format for your next appointment:   In Person  Provider:   Rozann Lesches, MD   Other Instructions You have been referred to Electrophysiology, Dr.Taylor

## 2021-07-18 NOTE — Telephone Encounter (Signed)
  Patient Consent for Virtual Visit        Vickie Sutton has provided verbal consent on 07/18/2021 for a virtual visit (video or telephone).   CONSENT FOR VIRTUAL VISIT FOR:  Vickie Sutton  By participating in this virtual visit I agree to the following:  I hereby voluntarily request, consent and authorize [facility] and its employed or contracted physicians, physician assistants, nurse practitioners or other licensed health care professionals (the Practitioner), to provide me with telemedicine health care services (the "Services") as deemed necessary by the treating Practitioner. I acknowledge and consent to receive the Services by the Practitioner via telemedicine. I understand that the telemedicine visit will involve communicating with the Practitioner through live audiovisual communication technology and the disclosure of certain medical information by electronic transmission. I acknowledge that I have been given the opportunity to request an in-person assessment or other available alternative prior to the telemedicine visit and am voluntarily participating in the telemedicine visit.  I understand that I have the right to withhold or withdraw my consent to the use of telemedicine in the course of my care at any time, without affecting my right to future care or treatment, and that the Practitioner or I may terminate the telemedicine visit at any time. I understand that I have the right to inspect all information obtained and/or recorded in the course of the telemedicine visit and may receive copies of available information for a reasonable fee.  I understand that some of the potential risks of receiving the Services via telemedicine include:  Delay or interruption in medical evaluation due to technological equipment failure or disruption; Information transmitted may not be sufficient (e.g. poor resolution of images) to allow for appropriate medical decision making by the  Practitioner; and/or  In rare instances, security protocols could fail, causing a breach of personal health information.  Furthermore, I acknowledge that it is my responsibility to provide information about my medical history, conditions and care that is complete and accurate to the best of my ability. I acknowledge that Practitioner's advice, recommendations, and/or decision may be based on factors not within their control, such as incomplete or inaccurate data provided by me or distortions of diagnostic images or specimens that may result from electronic transmissions. I understand that the practice of medicine is not an exact science and that Practitioner makes no warranties or guarantees regarding treatment outcomes. I acknowledge that a copy of this consent can be made available to me via my patient portal ([facility]), or I can request a printed copy by calling the office of [facility].    I understand that my insurance will be billed for this visit.   I have read or had this consent read to me. I understand the contents of this consent, which adequately explains the benefits and risks of the Services being provided via telemedicine.  I have been provided ample opportunity to ask questions regarding this consent and the Services and have had my questions answered to my satisfaction. I give my informed consent for the services to be provided through the use of telemedicine in my medical care

## 2021-07-21 ENCOUNTER — Ambulatory Visit: Payer: 59 | Admitting: Cardiology

## 2021-07-22 ENCOUNTER — Ambulatory Visit (INDEPENDENT_AMBULATORY_CARE_PROVIDER_SITE_OTHER): Payer: 59

## 2021-07-22 DIAGNOSIS — Z8249 Family history of ischemic heart disease and other diseases of the circulatory system: Secondary | ICD-10-CM | POA: Diagnosis not present

## 2021-07-22 DIAGNOSIS — I498 Other specified cardiac arrhythmias: Secondary | ICD-10-CM | POA: Diagnosis not present

## 2021-07-22 DIAGNOSIS — G90A Postural orthostatic tachycardia syndrome (POTS): Secondary | ICD-10-CM

## 2021-07-22 DIAGNOSIS — R002 Palpitations: Secondary | ICD-10-CM | POA: Diagnosis not present

## 2021-07-24 ENCOUNTER — Other Ambulatory Visit: Payer: Self-pay

## 2021-07-24 NOTE — Addendum Note (Signed)
Addended by: Barbarann Ehlers A on: 07/24/2021 08:20 AM   Modules accepted: Orders

## 2021-07-26 ENCOUNTER — Telehealth: Payer: Self-pay | Admitting: Physician Assistant

## 2021-07-26 DIAGNOSIS — R002 Palpitations: Secondary | ICD-10-CM

## 2021-07-26 NOTE — Telephone Encounter (Signed)
Pt notified and verbalized understanding. Pt will get magnesium lab drawn at [facility]

## 2021-07-26 NOTE — Telephone Encounter (Signed)
Labs reviewed from [facility]. Potassium, thyroid fine. Would have her come in for a magnesium level at her next convenience (non-fasting). Thanks!  Kayda Allers PA-C

## 2021-08-08 DIAGNOSIS — R7989 Other specified abnormal findings of blood chemistry: Secondary | ICD-10-CM | POA: Diagnosis not present

## 2021-08-15 ENCOUNTER — Other Ambulatory Visit: Payer: Self-pay

## 2021-08-15 ENCOUNTER — Encounter: Payer: Self-pay | Admitting: Internal Medicine

## 2021-08-15 ENCOUNTER — Ambulatory Visit (INDEPENDENT_AMBULATORY_CARE_PROVIDER_SITE_OTHER): Payer: 59 | Admitting: Internal Medicine

## 2021-08-15 ENCOUNTER — Telehealth: Payer: Self-pay | Admitting: Medical

## 2021-08-15 VITALS — BP 98/68 | HR 76 | Ht 65.0 in | Wt 151.2 lb

## 2021-08-15 DIAGNOSIS — I498 Other specified cardiac arrhythmias: Secondary | ICD-10-CM

## 2021-08-15 DIAGNOSIS — R002 Palpitations: Secondary | ICD-10-CM

## 2021-08-15 DIAGNOSIS — G90A Postural orthostatic tachycardia syndrome (POTS): Secondary | ICD-10-CM

## 2021-08-15 NOTE — Progress Notes (Signed)
HPI [name] is referred today by Vickie Sutton for evaluation of palpitations and an abnormal ECG. She has a long h/o anxiety. She worn a cardiac monitor for a couple of weeks. She has a very short PR interval and early transition on her 12 lead ECG though no clear cut ventricular pre-excitation. She denies a h/o sudden palpitations but does have a h/o sudden sensation of anxiety and panic attacks which have been present for almost 10 years. She has no warning and denies a trigger. She also has a h/o morbid obesity and tells me she has lost over 170 lbs. She has preserved LV function by echo. She is currently wearing a heart monitor. Her brother has WPW.  No Known Allergies   Current Outpatient Medications  Medication Sig Dispense Refill   albuterol (VENTOLIN HFA) 108 (90 Base) MCG/ACT inhaler Inhale 2 puffs into the lungs 4 (four) times daily as needed for wheezing or shortness of breath.     escitalopram (LEXAPRO) 20 MG tablet Take 20 mg by mouth daily.     metoprolol succinate (TOPROL-XL) 25 MG 24 hr tablet Take 12.5 mg by mouth daily.     Multiple Vitamin (MULTIVITAMIN) tablet Take 1 tablet by mouth daily.     Norethin Ace-Eth Estrad-FE (LOESTRIN 24 FE PO) Take by mouth.     No current facility-administered medications for this visit.     Past Medical History:  Diagnosis Date   ADD (attention deficit disorder)    Anxiety    Asthma    Depression    Migraine    Panic attacks    Polycystic ovarian syndrome    POTS (postural orthostatic tachycardia syndrome)     ROS:   All systems reviewed and negative except as noted in the HPI.   Past Surgical History:  Procedure Laterality Date   ESOPHAGOGASTRODUODENOSCOPY     LAPAROTOMY N/A 09/07/2020   Procedure: EXPLORATORY LAPAROTOMY WITH EXTRACTION  OVARIAN CYST;  Surgeon: Florian Buff, MD;  Location: [facility];  Service: Gynecology;  Laterality: N/A;   ROUX-EN-Y GASTRIC BYPASS     August 2020 - [facility]   TOOTH EXTRACTION N/A  12/12/2015   Procedure: EXTRACTION MOLARS - teeth one, sixteen, seventeen and thirty-two;  Surgeon: Diona Browner, DDS;  Location: [facility] [place] [facility];  Service: Oral Surgery;  Laterality: N/A;     Family History  Problem Relation Age of Onset   Diabetes Mellitus II Maternal Grandmother    Heart disease Maternal Grandmother    Obesity Mother    Yves Dill Parkinson White syndrome Brother    Seizures Maternal Aunt    Pneumonia Son      Social History   Socioeconomic History   Marital status: Single    Spouse name: Not on file   Number of children: Not on file   Years of education: Not on file   Highest education level: Not on file  Occupational History   Not on file  Tobacco Use   Smoking status: Never   Smokeless tobacco: Never  Vaping Use   Vaping Use: Never used  Substance and Sexual Activity   Alcohol use: No   Drug use: No   Sexual activity: Not Currently    Birth control/protection: Pill  Other Topics Concern   Not on file  Social History Narrative   Not on file   Social Determinants of Health   Financial Resource Strain: Medium Risk   Difficulty of Paying Living Expenses: Somewhat hard  Food Insecurity:  No Food Insecurity   Worried About Charity fundraiser in the Last Year: Never true   Ran Out of Food in the Last Year: Never true  Transportation Needs: No Transportation Needs   Lack of Transportation (Medical): No   Lack of Transportation (Non-Medical): No  Physical Activity: Insufficiently Active   Days of Exercise per Week: 3 days   Minutes of Exercise per Session: 30 min  Stress: Stress Concern Present   Feeling of Stress : Rather much  Social Connections: Socially Isolated   Frequency of Communication with Friends and Family: More than three times a week   Frequency of Social Gatherings with Friends and Family: Three times a week   Attends Religious Services: Never   Active Member of Clubs or Organizations: No   Attends Archivist Meetings: Never    Marital Status: Never married  Human resources officer Violence: Not At Risk   Fear of Current or Ex-Partner: No   Emotionally Abused: No   Physically Abused: No   Sexually Abused: No     BP 98/68   Pulse 76   Ht '5\' 5"'$  (1.651 m)   Wt 151 lb 3.2 oz (68.6 kg)   SpO2 98%   BMI 25.16 kg/m   Physical Exam:  Well appearing 20 yo woman, NAD HEENT: Unremarkable Neck:  6 cm JVD, no thyromegally Lymphatics:  No adenopathy Back:  No CVA tenderness Lungs:  Clear with no wheezes HEART:  Regular rate rhythm, no murmurs, no rubs, no clicks Abd:  soft, positive bowel sounds, no organomegally, no rebound, no guarding Ext:  2 plus pulses, no edema, no cyanosis, no clubbing Skin:  No rashes no nodules Neuro:  CN II through XII intact, motor grossly intact  EKG - nsr with a short PR and early pre-cordial transition with a large R wave in V1.   Assess/Plan:  Abnormal ECG - she has no documented clear cut SVT. I suspect that all so far is due to sinus tachycardia. That she has episodes of unprovoked anxiety raises the question of whether she could have an occult pathway. If her heart monitor is abnormal, we might consider insertion of an ILR.  Orthostasis - she has some dizziness and HR's of 170. She has a normal echo. She is encouraged to maintain an adequate sodium intake and hydration.  Obesity - amazingly she has lost 170 after gastric bypass. She is currently at an ideal weight.  Vickie Sutton Vickie Dilling,MD

## 2021-08-15 NOTE — Telephone Encounter (Signed)
   Patient called the after hours line reporting palpitations and chest discomfort. She reports she was driving to work this morning and felt "off". She looked at her apple watch and HR was reported to be in the 140s. She got to work and sat in the parking lot at which time she continue to have a racing heart beat sensation and chest discomfort. She has had similar episodes in the past and was undergoing a 30 day cardiac monitor after a telemedicine visit with Melina Copa, PA-C 06/2021. Unfortunately she was not wearing her monitor at the time of her symptoms this morning as the battery had died, per her report. At the time of my call her chest discomfort had resolve, though she still felt woozy. HR was in the 120s. She is scheduled to see Dr. Lovena Le this afternoon at 2:15pm. I discussed importance of making this appointment. We reviewed ED precautions should symptoms worsen or she have a syncopal episode. I advised against driving at this time to avoid risk of harm to self/others should symptoms worsen. Patient was appreciative of the call and in agreement with the plan.   Abigail Butts, PA-C 08/15/21; 7:08 AM

## 2021-08-15 NOTE — Patient Instructions (Signed)
Medication Instructions:  Your physician recommends that you continue on your current medications as directed. Please refer to the Current Medication list given to you today.  *If you need a refill on your cardiac medications before your next appointment, please call your pharmacy*   Lab Work: NONE   If you have labs (blood work) drawn today and your tests are completely normal, you will receive your results only by: [place] (if you have MyChart) OR A paper copy in the mail If you have any lab test that is abnormal or we need to change your treatment, we will call you to review the results.   Testing/Procedures: NONE    Follow-Up: At [facility], you and your health needs are our priority.  As part of our continuing mission to provide you with exceptional heart care, we have created designated Provider Care Teams.  These Care Teams include your primary Cardiologist (physician) and Advanced Practice Providers (APPs -  Physician Assistants and Nurse Practitioners) who all work together to provide you with the care you need, when you need it.  We recommend signing up for the patient portal called "MyChart".  Sign up information is provided on this After Visit Summary.  MyChart is used to connect with patients for Virtual Visits (Telemedicine).  Patients are able to view lab/test results, encounter notes, upcoming appointments, etc.  Non-urgent messages can be sent to your provider as well.   To learn more about what you can do with MyChart, go to [email or web address].    Your next appointment:    Pending results of heart monitor   The format for your next appointment:   In Person  Provider:   Cristopher Peru, MD   Other Instructions Thank you for choosing [facility]!

## 2021-09-20 DIAGNOSIS — J029 Acute pharyngitis, unspecified: Secondary | ICD-10-CM | POA: Diagnosis not present

## 2021-09-20 DIAGNOSIS — J069 Acute upper respiratory infection, unspecified: Secondary | ICD-10-CM | POA: Diagnosis not present

## 2021-10-31 ENCOUNTER — Ambulatory Visit: Payer: 59 | Admitting: Cardiology

## 2021-10-31 ENCOUNTER — Ambulatory Visit: Payer: 59 | Admitting: Internal Medicine

## 2021-11-25 DIAGNOSIS — Z20822 Contact with and (suspected) exposure to covid-19: Secondary | ICD-10-CM | POA: Diagnosis not present

## 2021-11-25 DIAGNOSIS — M791 Myalgia, unspecified site: Secondary | ICD-10-CM | POA: Diagnosis not present

## 2021-11-25 DIAGNOSIS — J101 Influenza due to other identified influenza virus with other respiratory manifestations: Secondary | ICD-10-CM | POA: Diagnosis not present

## 2021-11-25 DIAGNOSIS — L03115 Cellulitis of right lower limb: Secondary | ICD-10-CM | POA: Diagnosis not present

## 2021-11-30 ENCOUNTER — Encounter: Payer: Self-pay | Admitting: Cardiology

## 2021-11-30 ENCOUNTER — Ambulatory Visit (INDEPENDENT_AMBULATORY_CARE_PROVIDER_SITE_OTHER): Payer: 59 | Admitting: Cardiology

## 2021-11-30 ENCOUNTER — Other Ambulatory Visit: Payer: Self-pay

## 2021-11-30 VITALS — BP 114/62 | HR 97 | Ht 64.0 in | Wt 145.0 lb

## 2021-11-30 DIAGNOSIS — R002 Palpitations: Secondary | ICD-10-CM

## 2021-11-30 NOTE — Patient Instructions (Addendum)
Medication Instructions:  Your physician recommends that you continue on your current medications as directed. Please refer to the Current Medication list given to you today.  Labwork: none  Testing/Procedures: none  Follow-Up: Your physician recommends that you schedule a follow-up appointment in: 6 months Your physician recommends that you schedule a follow-up appointment with Dr. Lovena Le.  Any Other Special Instructions Will Be Listed Below (If Applicable).  If you need a refill on your cardiac medications before your next appointment, please call your pharmacy.

## 2021-11-30 NOTE — Progress Notes (Signed)
Cardiology Office Note  Date: 11/30/2021   ID: [facility] 07/27/2001, MRN [id number]  PCP:  Rosalee Kaufman, PA-C  Cardiologist:  Rozann Lesches, MD Electrophysiologist:  None   Chief Complaint  Patient presents with   Cardiac follow-up    History of Present Illness: V[facility]ie [facility] is a 20 y.o. female last assessed via telehealth encounter in July by Ms. Dunn PA-C and subsequently evaluated in consultation by Dr. Lovena Le in August, I reviewed the notes.  She is here for a routine visit.  She continues to experience intermittent episodes of rapid heartbeat.  This can happen both being seated and standing.  No associated syncope but does feel short of breath and sometimes has a pinching sensation in her chest.  It is not entirely clear however these episodes are sudden in onset or offset.  Orthostatic vital signs by our previous assessment was more consistent with POTS, but there is still some concern that this could be PSVT with a concealed pathway.  She has not had a definitive arrhythmia by cardiac monitoring.  She remains on Toprol-XL 12.5 mg daily and tends to run a relatively low blood pressure at baseline.  We discussed referral back to Dr. Lovena Le as they had discussed possibility of ILR.  Past Medical History:  Diagnosis Date   ADD (attention deficit disorder)    Anxiety    Asthma    Depression    Migraine    Panic attacks    Polycystic ovarian syndrome    POTS (postural orthostatic tachycardia syndrome)     Past Surgical History:  Procedure Laterality Date   ESOPHAGOGASTRODUODENOSCOPY     LAPAROTOMY N/A 09/07/2020   Procedure: EXPLORATORY LAPAROTOMY WITH EXTRACTION  OVARIAN CYST;  Surgeon: Florian Buff, MD;  Location: [place];  Service: Gynecology;  Laterality: N/A;   ROUX-EN-Y GASTRIC BYPASS     August 2020 - [facility]   TOOTH EXTRACTION N/A 12/12/2015   Procedure: EXTRACTION MOLARS - teeth one, sixteen, seventeen and thirty-two;  Surgeon: Diona Browner, DDS;  Location: [place];  Service: Oral Surgery;  Laterality: N/A;    Current Outpatient Medications  Medication Sig Dispense Refill   albuterol (VENTOLIN HFA) 108 (90 Base) MCG/ACT inhaler Inhale 2 puffs into the lungs 4 (four) times daily as needed for wheezing or shortness of breath.     escitalopram (LEXAPRO) 20 MG tablet Take 20 mg by mouth daily.     metoprolol succinate (TOPROL-XL) 25 MG 24 hr tablet Take 12.5 mg by mouth daily.     Multiple Vitamin (MULTIVITAMIN) tablet Take 1 tablet by mouth daily.     Norethin Ace-Eth Estrad-FE (LOESTRIN 24 FE PO) Take by mouth.     No current facility-administered medications for this visit.   Allergies:  Patient has no known allergies.   ROS: No frank syncope.  Physical Exam: VS:  BP 114/62   Pulse 97   Ht 5\' 4"  (1.626 m)   Wt 145 lb (65.8 kg)   SpO2 100%   BMI 24.89 kg/m , BMI Body mass index is 24.89 kg/m.  Wt Readings from Last 3 Encounters:  11/30/21 145 lb (65.8 kg)  08/15/21 151 lb 3.2 oz (68.6 kg)  07/18/21 149 lb (67.6 kg)    General: Patient appears comfortable at rest. HEENT: Conjunctiva and lids normal, wearing a mask. Lungs: Clear to auscultation, nonlabored breathing at rest. Cardiac: Regular rate and rhythm, no S3 or significant systolic murmur, no pericardial rub.  ECG:  An ECG dated 05/05/2021 was personally reviewed today and demonstrated:  SInus rhythm with short PR interval, no delta waves, inferior Q waves, nonspecific T wave changes V1 and V2.  Recent Labwork: 05/05/2021: BUN 12; Creatinine, Ser 0.60; Potassium 3.7; Sodium 141 06/16/2021: Hemoglobin 13.0; Platelets 228; TSH 1.040   Other Studies Reviewed Today:  Echocardiogram 02/09/2020:  1. Left ventricular ejection fraction, by estimation, is 55%. The left  ventricle has normal function. The left ventricle has no regional wall  motion abnormalities. Left ventricular diastolic parameters were normal.   2. Right ventricular systolic function is  normal. The right ventricular  size is normal. There is normal pulmonary artery systolic pressure. The  estimated right ventricular systolic pressure is 31.5 mmHg.   3. The mitral valve is grossly normal. No evidence of mitral valve  regurgitation.   4. The aortic valve is tricuspid. Aortic valve regurgitation is not  visualized.   5. The inferior vena cava is normal in size with greater than 50%  respiratory variability, suggesting right atrial pressure of 3 mmHg.   Cardiac monitor August 2022: [employer] monitor reviewed.  30 days analyzed.  Predominant rhythm is sinus with heart rate ranging from 54 bpm in sinus bradycardia up to 158 bpm in sinus tachycardia.  There were no obvious arrhythmias documented, no pauses.  Symptoms reported as rapid heartbeat and dizziness did not necessarily correspond to a particular heart rate.  Assessment and Plan:  History of recurring palpitations and elevation in heart rate.  Prior orthostatic measurements have raised possibility of autonomic dysfunction/POTS.  There is still some concern that she may have PSVT with a concealed pathway.  Her ECG is not diagnostic of WPW however.  She has had evaluation by Dr. Lovena Le and discussion of possible ILR with persistent symptoms.  30-day cardiac monitor in August did not show any definite arrhythmias.  Plan to continue current dose of Toprol-XL, continue to encourage hydration, schedule follow-up with Dr. Lovena Le for further discussion of ILR.  Medication Adjustments/Labs and Tests Ordered: Current medicines are reviewed at length with the patient today.  Concerns regarding medicines are outlined above.   Tests Ordered: No orders of the defined types were placed in this encounter.   Medication Changes: No orders of the defined types were placed in this encounter.   Disposition:  Follow up  6 months.  Signed, Satira Sark, MD, [facility] 11/30/2021 3:20 PM    [facility] [place], [place], [place] [address] Phone: [phone]; Fax: [phone]

## 2021-12-15 ENCOUNTER — Encounter (HOSPITAL_COMMUNITY): Payer: Self-pay

## 2021-12-15 ENCOUNTER — Emergency Department (HOSPITAL_COMMUNITY)
Admission: EM | Admit: 2021-12-15 | Discharge: 2021-12-15 | Disposition: A | Discharge status: Home / Self Care | Payer: 59 | Attending: Emergency Medicine | Admitting: Emergency Medicine

## 2021-12-15 DIAGNOSIS — R55 Syncope and collapse: Secondary | ICD-10-CM | POA: Diagnosis not present

## 2021-12-15 DIAGNOSIS — Z7952 Long term (current) use of systemic steroids: Secondary | ICD-10-CM | POA: Diagnosis not present

## 2021-12-15 DIAGNOSIS — J45909 Unspecified asthma, uncomplicated: Secondary | ICD-10-CM | POA: Insufficient documentation

## 2021-12-15 DIAGNOSIS — R002 Palpitations: Secondary | ICD-10-CM | POA: Diagnosis not present

## 2021-12-15 DIAGNOSIS — R0789 Other chest pain: Secondary | ICD-10-CM | POA: Diagnosis not present

## 2021-12-15 LAB — CBC WITH DIFFERENTIAL/PLATELET
Abs Immature Granulocytes: 0.02 10*3/uL (ref 0.00–0.07)
Basophils Absolute: 0 10*3/uL (ref 0.0–0.1)
Basophils Relative: 0 %
Eosinophils Absolute: 0 10*3/uL (ref 0.0–0.5)
Eosinophils Relative: 0 %
HCT: 39.1 % (ref 36.0–46.0)
Hemoglobin: 12.4 g/dL (ref 12.0–15.0)
Immature Granulocytes: 0 %
Lymphocytes Relative: 19 %
Lymphs Abs: 1.8 10*3/uL (ref 0.7–4.0)
MCH: 27.1 pg (ref 26.0–34.0)
MCHC: 31.7 g/dL (ref 30.0–36.0)
MCV: 85.4 fL (ref 80.0–100.0)
Monocytes Absolute: 0.4 10*3/uL (ref 0.1–1.0)
Monocytes Relative: 4 %
Neutro Abs: 7.4 10*3/uL (ref 1.7–7.7)
Neutrophils Relative %: 77 %
Platelets: 259 10*3/uL (ref 150–400)
RBC: 4.58 MIL/uL (ref 3.87–5.11)
RDW: 15.1 % (ref 11.5–15.5)
WBC: 9.7 10*3/uL (ref 4.0–10.5)
nRBC: 0 % (ref 0.0–0.2)

## 2021-12-15 LAB — BASIC METABOLIC PANEL
Anion gap: 10 (ref 5–15)
BUN: 14 mg/dL (ref 6–20)
CO2: 21 mmol/L — ABNORMAL LOW (ref 22–32)
Calcium: 9.1 mg/dL (ref 8.9–10.3)
Chloride: 104 mmol/L (ref 98–111)
Creatinine, Ser: 0.69 mg/dL (ref 0.44–1.00)
GFR, Estimated: >60 mL/min (ref >60)
Glucose, Bld: 80 mg/dL (ref 70–99)
Potassium: 4 mmol/L (ref 3.5–5.1)
Sodium: 135 mmol/L (ref 135–145)

## 2021-12-15 LAB — I-STAT BETA HCG BLOOD, ED (MC, WL, AP ONLY): I-stat hCG, quantitative: <5 m[IU]/mL (ref <5)

## 2021-12-15 LAB — TROPONIN I (HIGH SENSITIVITY)
Troponin I (High Sensitivity): 6 ng/L (ref <18)
Troponin I (High Sensitivity): 7 ng/L (ref <18)

## 2021-12-15 MED ORDER — SODIUM CHLORIDE 0.9 % IV BOLUS
1000.0000 mL | Freq: Once | INTRAVENOUS | Status: AC
  Administered 2021-12-15: 13:00:00 1000 mL via INTRAVENOUS

## 2021-12-15 MED ORDER — SODIUM CHLORIDE 0.9 % IV BOLUS
1000.0000 mL | Freq: Once | INTRAVENOUS | Status: AC
  Administered 2021-12-15: 11:00:00 1000 mL via INTRAVENOUS

## 2021-12-15 MED ORDER — SODIUM CHLORIDE 0.9 % IV BOLUS: 1000.0000 mL | Freq: Once | INTRAVENOUS | Status: DC

## 2021-12-15 NOTE — ED Notes (Signed)
MD notified re: the pts BP, verbal for pt to receive NS Bolus

## 2021-12-15 NOTE — ED Provider Notes (Signed)
[facility] EMERGENCY DEPARTMENT Provider Note   CSN: [id number] Arrival date & time: 12/15/21  2706     History Chief Complaint  Patient presents with   Loss of Consciousness    [facility] is a 20 y.o. female.  HPI  Patient with history of POTS, asthma, anxiety presents due to syncopal event.  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  She was caught, did not hit her head.  Denies any chest pain during the incident, did not feel dizzy preceding it, eating and drinking normally at home.  Reports this morning she felt like her heart was racing, her chest felt tight.  She has had episodes like this previously, went to work thinking it would pass.    She is currently on Topamax, followed by cardiology.  Last Holter monitor worn in August of this year without any signs of arrhythmia, there was a few episodes isolated of sinus tach up to the 160s.  No recent changes in her medicine, most recent visit with cardiology 11/30/2021.  They feel this is more consistent with POTS, there is discussion about implantable monitoring to assess for PSVT.  Her speech is somewhat slow, not particularly slurred.  Reports this is happened on 1 episode previously, unclear why.  States last time it resolved independently with 3 to 4 hours of observation.    Past Medical History:  Diagnosis Date   ADD (attention deficit disorder)    Anxiety    Asthma    Depression    Migraine    Panic attacks    Polycystic ovarian syndrome    POTS (postural orthostatic tachycardia syndrome)     Patient Active Problem List   Diagnosis Date Noted   Postpartum depression 03/17/2021   History of Roux-en-Y gastric bypass 08/08/2020   PCOS (polycystic ovarian syndrome) 08/08/2020   Anxiety 08/08/2020   Syncope 07/25/2020   POTS (postural orthostatic tachycardia syndrome) 05/03/2020   Asthma 03/23/2019    Past Surgical History:  Procedure  Laterality Date   ESOPHAGOGASTRODUODENOSCOPY     LAPAROTOMY N/A 09/07/2020   Procedure: EXPLORATORY LAPAROTOMY WITH EXTRACTION  OVARIAN CYST;  Surgeon: Florian Buff, MD;  Location: [place];  Service: Gynecology;  Laterality: N/A;   ROUX-EN-Y GASTRIC BYPASS     August 2020 - [facility]   TOOTH EXTRACTION N/A 12/12/2015   Procedure: EXTRACTION MOLARS - teeth one, sixteen, seventeen and thirty-two;  Surgeon: Diona Browner, DDS;  Location: [place];  Service: Oral Surgery;  Laterality: N/A;     OB History     Gravida  1   Para  1   Term  1   Preterm      AB      Living  1      SAB      IAB      Ectopic      Multiple  0   Live Births  1           Family History  Problem Relation Age of Onset   Diabetes Mellitus II Maternal Grandmother    Heart disease Maternal Grandmother    Obesity Mother    Yves Dill Parkinson White syndrome Brother    Seizures Maternal Aunt    Pneumonia Son     Social History   Tobacco Use   Smoking status: Never   Smokeless tobacco: Never  Vaping Use   Vaping Use: Never used  Substance Use Topics   Alcohol use: No   Drug use: No    Home Medications Prior to Admission medications   Medication Sig Start Date End Date Taking? Authorizing Provider  albuterol (VENTOLIN HFA) 108 (90 Base) MCG/ACT inhaler Inhale 2 puffs into the lungs 4 (four) times daily as needed for wheezing or shortness of breath.    [provider]  escitalopram (LEXAPRO) 20 MG tablet Take 20 mg by mouth daily. 07/11/21   [provider]  metoprolol succinate (TOPROL-XL) 25 MG 24 hr tablet Take 12.5 mg by mouth daily.    [provider]  Multiple Vitamin (MULTIVITAMIN) tablet Take 1 tablet by mouth daily.    [provider]  Norethin Ace-Eth Estrad-FE (LOESTRIN 24 FE PO) Take by mouth.    [provider]    Allergies    Patient has no known allergies.  Review of Systems   Review of Systems  Constitutional:  Negative for fever.   Respiratory:  Positive for chest tightness. Negative for shortness of breath.   Cardiovascular:  Positive for palpitations. Negative for chest pain.  Gastrointestinal:  Negative for nausea and vomiting.  Genitourinary:  Negative for dysuria.  Musculoskeletal:  Negative for gait problem and neck stiffness.  Neurological:  Positive for syncope and speech difficulty.       Slowed speech   Psychiatric/Behavioral:  The patient is not nervous/anxious.    Physical Exam Updated Vital Signs BP 97/74    Pulse 81    Resp 18    Ht 5\' 4"  (1.626 m)    Wt 65.7 kg    SpO2 98%    BMI 24.86 kg/m   Physical Exam Vitals and nursing note reviewed. Exam conducted with a chaperone present.  Constitutional:      Appearance: Normal appearance.  HENT:     Head: Normocephalic and atraumatic.  Eyes:     General: No scleral icterus.       Right eye: No discharge.        Left eye: No discharge.     Extraocular Movements: Extraocular movements intact.     Pupils: Pupils are equal, round, and reactive to light.  Cardiovascular:     Rate and Rhythm: Normal rate and regular rhythm.     Pulses: Normal pulses.     Heart sounds: Normal heart sounds. No murmur heard.   No friction rub. No gallop.  Pulmonary:     Effort: Pulmonary effort is normal. No respiratory distress.     Breath sounds: Normal breath sounds.  Abdominal:     General: Abdomen is flat. Bowel sounds are normal. There is no distension.     Palpations: Abdomen is soft.     Tenderness: There is no abdominal tenderness.  Skin:    General: Skin is warm and dry.     Coloration: Skin is not jaundiced.  Neurological:     Mental Status: She is alert. Mental status is at baseline.     Coordination: Coordination normal.   ED Results / Procedures / Treatments   Labs (all labs ordered are listed, but only abnormal results are displayed) Labs Reviewed - No data to display  EKG EKG Interpretation  Date/Time:  Friday December 15 2021 09:56:20  EST Ventricular Rate:  72 PR Interval:    QRS Duration: 111 QT Interval:  418 QTC Calculation: 458 R Axis:   75 Text Interpretation: No significant change since last tracing Confirmed by Isla Pence [phone]) on 12/15/2021 9:59:41 AM  Radiology No results found.  Procedures Procedures   Medications Ordered in ED Medications - No data to display  ED Course  I have reviewed the triage vital signs and the nursing notes.  Pertinent labs & imaging results that were available during my care of the patient were reviewed by me and considered in my medical decision making (see chart for details).    MDM Rules/Calculators/A&P                         Vitals are stable, patient is nontoxic.  Her speech was initially slowed for nursing staff, normal for me.  She denies any chest pain, no chest tightness this morning so initial troponin ordered.  Does not seem consistent with ACS, EKG is unchanged from prior.  To be arrhythmia versus POTS.  She is PERC negative, doubt this is a PE.  No gross electrolyte derangement, no leukocytosis or anemia contributory to her symptoms.  There were some slight orthostatic changes consistent with POTS.  Patient reports feeling much improved after fluids.  She is followed by Dr. Domenic Polite with cardiology and Dr. Lovena Le.  Advised to follow-up with them, do not think additional work-up needed at this time.  Discussed HPI, physical exam and plan of care for this patient with attending Dr. Isla Pence. The attending physician evaluated this patient as part of a shared visit and agrees with plan of care.      Final Clinical Impression(s) / ED Diagnoses Final diagnoses:  None    Rx / DC Orders ED Discharge Orders     None        Sherrill Raring, Hershal Coria 12/15/21 1635    Isla Pence, MD 12/16/21 [phone]

## 2021-12-15 NOTE — ED Triage Notes (Signed)
Pt NT from 300, and passed out.  Pt was slide down a wall and assisted to the floor.  Pt alert, words slurred, no hx of seizures, pt has a valve deformity which is reported to why she has these issues.  BS 153

## 2021-12-15 NOTE — Discharge Instructions (Signed)
Continue current medication unless directed otherwise by her cardiology. Call Dr. Myles Gip office and let them know what happened today.  See if they want to see you before January or if they have any additional medication changes. Take the weekend off, return to work next week when feeling better. Continue drink plenty of fluids at home.

## 2021-12-24 NOTE — L&D Delivery Note (Addendum)
Delivery Note Vickie [facility] is a 21 y.o. female G2P1001 admitted for spontaneous onset of labor. She progressed to full cervical dilation with no augmentation.   At 11:38 AM a viable and healthy female was delivered via Vaginal, Spontaneous (Presentation: Left Occiput Posterior).  APGAR: 7, 9; weight pending. Placenta status: Spontaneous, Intact.  Cord: 3 vessels with the following complications: None.  Anesthesia: Epidural Episiotomy: None Lacerations: Labial abrasion not requiring repair Est. Blood Loss (mL): 263  Mom to postpartum.  Baby to Couplet care / Skin to Skin.  Ethelene Hal, MD 11/10/2022, 12:01 PM   Fellow ATTESTATION  I was present and gloved for this delivery and agree with the above documentation in the resident's note:  Uncomplicated SVD. Good APGARs, baby at mom's bedside.  Stormy Card, MD/[facility] [employer] [facility] (Faculty Practice) 11/10/2022, 1:00 PM

## 2021-12-28 ENCOUNTER — Other Ambulatory Visit: Payer: Self-pay

## 2021-12-28 ENCOUNTER — Encounter: Payer: Self-pay | Admitting: Internal Medicine

## 2021-12-28 ENCOUNTER — Ambulatory Visit (INDEPENDENT_AMBULATORY_CARE_PROVIDER_SITE_OTHER): Payer: Medicaid Other | Admitting: Internal Medicine

## 2021-12-28 VITALS — BP 104/62 | HR 70 | Ht 65.0 in | Wt 147.4 lb

## 2021-12-28 DIAGNOSIS — G90A Postural orthostatic tachycardia syndrome (POTS): Secondary | ICD-10-CM

## 2021-12-28 NOTE — Progress Notes (Signed)
HPI Ms. Janota returns today for followup. She is a pleasant 21 yo woman with a h/o autonomic dysfunction and syncope. She has a short PR interval but no evidence of ventricular pre-excitation. She has a h/o syncope and her symptoms are very typical for autonomic dysfunction. She works at [employer] and was sent to the ED after an episode of syncope at work. She did not eat breakfast that morning, began to feel badly and passed out. She had a prodrome where she felt badly, her vision got blurry and after was diaphoretic and weak. She did not lose continence or bite her tongue. She was back to normal 4-5 hours later. She was noted to have some garbled speech and there was initially a question of a neurological event.  No Known Allergies   Current Outpatient Medications  Medication Sig Dispense Refill   albuterol (VENTOLIN HFA) 108 (90 Base) MCG/ACT inhaler Inhale 2 puffs into the lungs 4 (four) times daily as needed for wheezing or shortness of breath.     escitalopram (LEXAPRO) 20 MG tablet Take 20 mg by mouth daily.     metoprolol succinate (TOPROL-XL) 25 MG 24 hr tablet Take 12.5 mg by mouth daily.     Multiple Vitamin (MULTIVITAMIN) tablet Take 1 tablet by mouth daily.     Norethin Ace-Eth Estrad-FE (LOESTRIN 24 FE PO) Take by mouth.     No current facility-administered medications for this visit.     Past Medical History:  Diagnosis Date   ADD (attention deficit disorder)    Anxiety    Asthma    Depression    Migraine    Panic attacks    Polycystic ovarian syndrome    POTS (postural orthostatic tachycardia syndrome)     ROS:   All systems reviewed and negative except as noted in the HPI.   Past Surgical History:  Procedure Laterality Date   ESOPHAGOGASTRODUODENOSCOPY     LAPAROTOMY N/A 09/07/2020   Procedure: EXPLORATORY LAPAROTOMY WITH EXTRACTION  OVARIAN CYST;  Surgeon: Florian Buff, MD;  Location: [place];  Service: Gynecology;  Laterality: N/A;   ROUX-EN-Y  GASTRIC BYPASS     August 2020 - [facility]   TOOTH EXTRACTION N/A 12/12/2015   Procedure: EXTRACTION MOLARS - teeth one, sixteen, seventeen and thirty-two;  Surgeon: Diona Browner, DDS;  Location: [place];  Service: Oral Surgery;  Laterality: N/A;     Family History  Problem Relation Age of Onset   Diabetes Mellitus II Maternal Grandmother    Heart disease Maternal Grandmother    Obesity Mother    Yves Dill Parkinson White syndrome Brother    Seizures Maternal Aunt    Pneumonia Son      Social History   Socioeconomic History   Marital status: Single    Spouse name: Not on file   Number of children: Not on file   Years of education: Not on file   Highest education level: Not on file  Occupational History   Not on file  Tobacco Use   Smoking status: Never   Smokeless tobacco: Never  Vaping Use   Vaping Use: Never used  Substance and Sexual Activity   Alcohol use: No   Drug use: No   Sexual activity: Not Currently    Birth control/protection: Pill  Other Topics Concern   Not on file  Social History Narrative   Not on file   Social Determinants of Health   Financial Resource Strain: Medium Risk  Difficulty of Paying Living Expenses: Somewhat hard  Food Insecurity: No Food Insecurity   Worried About Running Out of Food in the Last Year: Never true   Ran Out of Food in the Last Year: Never true  Transportation Needs: No Transportation Needs   Lack of Transportation (Medical): No   Lack of Transportation (Non-Medical): No  Physical Activity: Insufficiently Active   Days of Exercise per Week: 3 days   Minutes of Exercise per Session: 30 min  Stress: Stress Concern Present   Feeling of Stress : Rather much  Social Connections: Socially Isolated   Frequency of Communication with Friends and Family: More than three times a week   Frequency of Social Gatherings with Friends and Family: Three times a week   Attends Religious Services: Never   Active Member of Clubs or  Organizations: No   Attends Archivist Meetings: Never   Marital Status: Never married  Human resources officer Violence: Not At Risk   Fear of Current or Ex-Partner: No   Emotionally Abused: No   Physically Abused: No   Sexually Abused: No     BP 104/62    Pulse 70    Ht 5\' 5"  (1.651 m)    Wt 147 lb 6.4 oz (66.9 kg)    SpO2 100%    BMI 24.53 kg/m   Physical Exam:  Well appearing NAD HEENT: Unremarkable Neck:  No JVD, no thyromegally Lymphatics:  No adenopathy Back:  No CVA tenderness Lungs:  Clear with no wheezes HEART:  Regular rate rhythm, no murmurs, no rubs, no clicks Abd:  soft, positive bowel sounds, no organomegally, no rebound, no guarding Ext:  2 plus pulses, no edema, no cyanosis, no clubbing Skin:  No rashes no nodules Neuro:  CN II through XII intact, motor grossly intact  Assess/Plan:  Syncope - the etiology is not due to an arrhythmia. She has classic symptoms of vasomotor syncope. I discussed the importance of avoiding caffeine and ETOH, I also reminded her on the importance of eating extra salt in her diet. I do not think she needs florinef or midodrine at this time.  Abnormal ECG - she has a short PR but no pre-excitation. She has never had documented SVT. We will follow. I do not think an ILR is indicated at this time. Carleene Overlie Crucita Lacorte,MD

## 2021-12-28 NOTE — Patient Instructions (Signed)
Medication Instructions:  Your physician recommends that you continue on your current medications as directed. Please refer to the Current Medication list given to you today.   Avoid Caffeine and Alcohol Increase Salt intake  Your provider may prescribe Midodrine Florinef   *If you need a refill on your cardiac medications before your next appointment, please call your pharmacy*   Lab Work: NONE   If you have labs (blood work) drawn today and your tests are completely normal, you will receive your results only by: [place] (if you have MyChart) OR A paper copy in the mail If you have any lab test that is abnormal or we need to change your treatment, we will call you to review the results.   Testing/Procedures: NONE    Follow-Up: At [facility], you and your health needs are our priority.  As part of our continuing mission to provide you with exceptional heart care, we have created designated Provider Care Teams.  These Care Teams include your primary Cardiologist (physician) and Advanced Practice Providers (APPs -  Physician Assistants and Nurse Practitioners) who all work together to provide you with the care you need, when you need it.  We recommend signing up for the patient portal called "MyChart".  Sign up information is provided on this After Visit Summary.  MyChart is used to connect with patients for Virtual Visits (Telemedicine).  Patients are able to view lab/test results, encounter notes, upcoming appointments, etc.  Non-urgent messages can be sent to your provider as well.   To learn more about what you can do with MyChart, go to [email or web address].    Your next appointment:   3-4 month(s)  The format for your next appointment:   In Person  Provider:   Cristopher Peru, MD    Other Instructions Thank you for choosing [facility]!

## 2022-01-16 ENCOUNTER — Encounter (HOSPITAL_COMMUNITY): Payer: Self-pay | Admitting: Emergency Medicine

## 2022-01-16 ENCOUNTER — Emergency Department (HOSPITAL_COMMUNITY)
Admission: EM | Admit: 2022-01-16 | Discharge: 2022-01-16 | Disposition: A | Discharge status: Home / Self Care | Payer: Medicaid Other | Attending: Emergency Medicine | Admitting: Emergency Medicine

## 2022-01-16 ENCOUNTER — Emergency Department (HOSPITAL_COMMUNITY): Payer: Medicaid Other

## 2022-01-16 ENCOUNTER — Other Ambulatory Visit: Payer: Self-pay

## 2022-01-16 DIAGNOSIS — R079 Chest pain, unspecified: Secondary | ICD-10-CM | POA: Diagnosis not present

## 2022-01-16 DIAGNOSIS — R002 Palpitations: Secondary | ICD-10-CM | POA: Insufficient documentation

## 2022-01-16 DIAGNOSIS — R55 Syncope and collapse: Secondary | ICD-10-CM | POA: Insufficient documentation

## 2022-01-16 LAB — CBG MONITORING, ED: Glucose-Capillary: 74 mg/dL (ref 70–99)

## 2022-01-16 LAB — CBC WITH DIFFERENTIAL/PLATELET
Abs Immature Granulocytes: 0.02 10*3/uL (ref 0.00–0.07)
Basophils Absolute: 0 10*3/uL (ref 0.0–0.1)
Basophils Relative: 0 %
Eosinophils Absolute: 0.1 10*3/uL (ref 0.0–0.5)
Eosinophils Relative: 1 %
HCT: 37.4 % (ref 36.0–46.0)
Hemoglobin: 11.9 g/dL — ABNORMAL LOW (ref 12.0–15.0)
Immature Granulocytes: 0 %
Lymphocytes Relative: 21 %
Lymphs Abs: 1.4 10*3/uL (ref 0.7–4.0)
MCH: 27.4 pg (ref 26.0–34.0)
MCHC: 31.8 g/dL (ref 30.0–36.0)
MCV: 86.2 fL (ref 80.0–100.0)
Monocytes Absolute: 0.4 10*3/uL (ref 0.1–1.0)
Monocytes Relative: 6 %
Neutro Abs: 5 10*3/uL (ref 1.7–7.7)
Neutrophils Relative %: 72 %
Platelets: 233 10*3/uL (ref 150–400)
RBC: 4.34 MIL/uL (ref 3.87–5.11)
RDW: 14.8 % (ref 11.5–15.5)
WBC: 6.9 10*3/uL (ref 4.0–10.5)
nRBC: 0 % (ref 0.0–0.2)

## 2022-01-16 LAB — COMPREHENSIVE METABOLIC PANEL
ALT: 14 U/L (ref 0–44)
AST: 24 U/L (ref 15–41)
Albumin: 4.6 g/dL (ref 3.5–5.0)
Alkaline Phosphatase: 94 U/L (ref 38–126)
Anion gap: 4 — ABNORMAL LOW (ref 5–15)
BUN: 12 mg/dL (ref 6–20)
CO2: 25 mmol/L (ref 22–32)
Calcium: 9.2 mg/dL (ref 8.9–10.3)
Chloride: 107 mmol/L (ref 98–111)
Creatinine, Ser: 0.69 mg/dL (ref 0.44–1.00)
GFR, Estimated: >60 mL/min (ref >60)
Glucose, Bld: 62 mg/dL — ABNORMAL LOW (ref 70–99)
Potassium: 4.1 mmol/L (ref 3.5–5.1)
Sodium: 136 mmol/L (ref 135–145)
Total Bilirubin: 0.6 mg/dL (ref 0.3–1.2)
Total Protein: 8.5 g/dL — ABNORMAL HIGH (ref 6.5–8.1)

## 2022-01-16 LAB — TROPONIN I (HIGH SENSITIVITY): Troponin I (High Sensitivity): 7 ng/L (ref <18)

## 2022-01-16 LAB — I-STAT BETA HCG BLOOD, ED (MC, WL, AP ONLY): I-stat hCG, quantitative: <5 m[IU]/mL (ref <5)

## 2022-01-16 MED ORDER — SODIUM CHLORIDE 0.9 % IV BOLUS
1000.0000 mL | Freq: Once | INTRAVENOUS | Status: AC
  Administered 2022-01-16: 10:00:00 1000 mL via INTRAVENOUS

## 2022-01-16 NOTE — ED Provider Notes (Signed)
[facility] EMERGENCY DEPARTMENT Provider Note   CSN: [id number] Arrival date & time: 01/16/22  0844     History  Chief Complaint  Patient presents with   Chest Pain    Vickie Sutton is a 21 y.o. female.  HPI 21 year old female presents with near syncope and chest pain/palpitations.  She has been dealing with palpitations similar to this for quite some time.  She states she has been having these episodes for years.  She currently follows with Dr. Lovena Le of cardiology.  She often has high heart rates when she is coming into work and today states her heart rate was in the 150s.  While she was in her room sitting down she started to feel lightheaded, get blurry vision, and coworkers told her she did not look well.  She was having some sharp chest pain.  She has had this chest pain many times before over the years with these similar near syncope episodes.  Eventually she came down to the emergency department.  Chest pain is not gone but is still present and about a 3 out of 10.  It is sharp in the middle of her chest.  Any type of movement makes it worse and sometimes deep breaths.  No leg swelling, recent travel, or history of DVT/PE.  Home Medications Prior to Admission medications   Medication Sig Start Date End Date Taking? Authorizing Provider  albuterol (VENTOLIN HFA) 108 (90 Base) MCG/ACT inhaler Inhale 2 puffs into the lungs 4 (four) times daily as needed for wheezing or shortness of breath.    [provider]  escitalopram (LEXAPRO) 20 MG tablet Take 20 mg by mouth daily. 07/11/21   [provider]  metoprolol succinate (TOPROL-XL) 25 MG 24 hr tablet Take 12.5 mg by mouth daily.    [provider]  Multiple Vitamin (MULTIVITAMIN) tablet Take 1 tablet by mouth daily.    [provider]  Norethin Ace-Eth Estrad-FE (LOESTRIN 24 FE PO) Take by mouth.    [provider]      Allergies    Patient has no known allergies.    Review of  Systems   Review of Systems  Respiratory:  Positive for shortness of breath.   Cardiovascular:  Positive for chest pain and palpitations. Negative for leg swelling.  Neurological:  Positive for light-headedness.   Physical Exam Updated Vital Signs BP 95/64    Pulse 76    Temp 98.1 F (36.7 C) (Oral)    Resp 13    Ht 5\' 5"  (1.651 m)    Wt 66.9 kg    LMP 12/23/2021 (Exact Date)    SpO2 100%    BMI 24.53 kg/m  Physical Exam Vitals and nursing note reviewed.  Constitutional:      General: She is not in acute distress.    Appearance: She is well-developed. She is not ill-appearing or diaphoretic.  HENT:     Head: Normocephalic and atraumatic.  Cardiovascular:     Rate and Rhythm: Normal rate and regular rhythm.     Heart sounds: Normal heart sounds. No murmur heard. Pulmonary:     Effort: Pulmonary effort is normal.     Breath sounds: Normal breath sounds.  Abdominal:     Palpations: Abdomen is soft.     Tenderness: There is no abdominal tenderness.  Skin:    General: Skin is warm and dry.  Neurological:     Mental Status: She is alert.    ED Results / Procedures /  Treatments   Labs (all labs ordered are listed, but only abnormal results are displayed) Labs Reviewed  COMPREHENSIVE METABOLIC PANEL - Abnormal; Notable for the following components:      Result Value   Glucose, Bld 62 (*)    Total Protein 8.5 (*)    Anion gap 4 (*)    All other components within normal limits  CBC WITH DIFFERENTIAL/PLATELET - Abnormal; Notable for the following components:   Hemoglobin 11.9 (*)    All other components within normal limits  I-STAT BETA HCG BLOOD, ED ([facility], [facility], [facility] ONLY)  CBG MONITORING, ED  TROPONIN I (HIGH SENSITIVITY)    EKG EKG Interpretation  Date/Time:  Tuesday January 16 2022 08:48:10 EST Ventricular Rate:  85 PR Interval:  124 QRS Duration: 106 QT Interval:  387 QTC Calculation: 461 R Axis:   80 Text Interpretation: Sinus rhythm no acute ST/T changes similar to  Dec 2022 Confirmed by Sherwood Gambler [phone]) on 01/16/2022 9:22:18 AM  Radiology DG Chest Portable 1 View  Result Date: 01/16/2022 CLINICAL DATA:  chest pain, near syncope EXAM: PORTABLE CHEST 1 VIEW COMPARISON:  None. FINDINGS: The cardiomediastinal silhouette is within normal limits. No pleural effusion. No pneumothorax. No mass or consolidation. No acute osseous abnormality. IMPRESSION: No acute findings in the chest. Electronically Signed   By: Albin Felling M.D.   On: 01/16/2022 09:44    Procedures Procedures    Medications Ordered in ED Medications  sodium chloride 0.9 % bolus 1,000 mL (0 mLs Intravenous Stopped 01/16/22 1057)    ED Course/ Medical Decision Making/ A&P                            Patient presents with recurrent palpitations and near syncope.  This seems similar to prior occurrences per her and chart review.  I have reviewed her most recent cardiology visit with Dr. Lovena Le which indicates he has a high concern for POTS.  She has soft blood pressures here but is not hypotensive and this is similar to prior.  She is not tachycardic here.  ECG without acute ischemia and has been personally interpreted by me.  Labs have been reviewed and interpreted and she has a very mild anemia and mildly low glucose though on fingerstick it is normal.  Troponin negative.  She is not pregnant.  Chest x-ray reviewed and interpreted by me with no emergent findings such as pneumonia or pneumothorax.  Given she has chest pain similar to what she is having today every time she had these episodes for a couple years, I think ACS, PE, etc. is all quite unlikely.  I do not think further work-up is needed.  We did discuss that technically she would need a second troponin given the time of onset and presentation and then troponin.  She declines and given the lower suspicion of ACS I think this is reasonable.  We will have her follow-up with cardiology.        Final Clinical Impression(s) / ED  Diagnoses Final diagnoses:  Near syncope    Rx / DC Orders ED Discharge Orders     None         Sherwood Gambler, MD 01/16/22 1529

## 2022-01-16 NOTE — Discharge Instructions (Signed)
If you develop recurrent, continued, or worsening chest pain, shortness of breath, fever, vomiting, abdominal or back pain, or any other new/concerning symptoms then return to the ER for evaluation.  

## 2022-01-16 NOTE — ED Triage Notes (Signed)
Pt to the ED from 300 with chest pain and anxiety that began when her heart rate began to rise and she felt as though she could not catch her breath.

## 2022-01-16 NOTE — ED Notes (Signed)
Provided patient with crackers. Peanut butter, and juice

## 2022-01-31 ENCOUNTER — Telehealth: Payer: Self-pay | Admitting: Cardiology

## 2022-01-31 NOTE — Telephone Encounter (Signed)
°  Are you calling in reference to your FMLA or disability form? yes  What is your question in regards to FMLA or disability form? Patient states she had requesting FMLA paperwork a couple months ago, but did not have the money for the forms. She wants to know if the filled out forms are still there   Do you need copies of your medical records? no  Are you waiting on a nurse to call you back with results or are you wanting copies of your results? no    Please route to Medical Records or your medical records site representative

## 2022-02-08 NOTE — Telephone Encounter (Signed)
Pt has called in again to find out about the FMLA paper.  She stated she is needing this to give to her work   [employer] number [phone]

## 2022-02-08 NOTE — Telephone Encounter (Signed)
Called patient. No Answer.  We need to find out which location she dropped off the FMLA papers and also if she has a receipt showing she paid. See note in chart on 01/31/2022.

## 2022-02-13 ENCOUNTER — Telehealth: Payer: Self-pay | Admitting: Cardiology

## 2022-02-13 NOTE — Telephone Encounter (Signed)
Forms received from [name] on 02/13/2022. Completed patient authorization attached. Forms will be given to Dr. Domenic Polite on 02/14/2022 (due to time of day(  VTS  02/13/2022 505pm

## 2022-02-13 NOTE — Telephone Encounter (Signed)
Spoke with patient today in regards to [facility] paper work.  She stated that she is going to have to start over with paper work. She will bringing it into the office today for Dr. Domenic Polite. She was notified of the $29.00 charge.

## 2022-02-16 ENCOUNTER — Encounter: Payer: Self-pay | Admitting: Cardiology

## 2022-02-16 ENCOUNTER — Other Ambulatory Visit: Payer: Self-pay

## 2022-02-16 ENCOUNTER — Ambulatory Visit (INDEPENDENT_AMBULATORY_CARE_PROVIDER_SITE_OTHER): Payer: BLUE CROSS/BLUE SHIELD | Admitting: Cardiology

## 2022-02-16 VITALS — BP 102/58 | HR 85 | Ht 64.0 in | Wt 148.0 lb

## 2022-02-16 DIAGNOSIS — R55 Syncope and collapse: Secondary | ICD-10-CM | POA: Diagnosis not present

## 2022-02-16 NOTE — Progress Notes (Signed)
Cardiology Office Note  Date: 02/16/2022   ID: Brisa, Auth Apr 28, 2001, MRN [id number]  PCP:  Rosalee Kaufman, PA-C  Cardiologist:  Rozann Lesches, MD Electrophysiologist:  None   Chief Complaint  Patient presents with   Cardiac follow-up    History of Present Illness: Vickie Sutton is a 21 y.o. female last seen in the office in January by Dr. Lovena Le.  She had interval evaluation by Dr. Saralyn Pilar in early February  for second opinion regarding her symptoms.  She completed wearing a cardiac monitor and has follow-up with him to review the results.  She tells me that she has continued to have episodes of syncope which by description sound most consistent with neurocardiogenic syncope.  We did entertain the possibility of POTS based on initial evaluation in the past with diagnostic orthostatic vital signs at that time.  Syncope however is not a hallmark of POTS, so another process is certainly reasonable to consider.  She has had no definite PSVT or high suspicion of preexcitation per EP evaluation by Dr. Lovena Le.  She also has pending neurology consultation in March.  Orthostatic vital signs today were normal.  Supine blood pressure 108/60 with heart rate 68, seated blood pressure 108/54 with heart rate 71, standing blood pressure 106/60 with heart rate 74, and standing blood pressure after 3 minutes of 106/62 with heart rate 81.  I reviewed her medications, she remains on low-dose Toprol-XL.  I did talk with her about the possibility of considering low-dose midodrine with continued symptoms, but it would be best to follow-up on her cardiac monitor results first to make sure that there is not another process to be considered.  She states that she had 3 syncopal events while wearing the cardiac monitor.  She has had to miss work due to symptoms, states that she has accrued points due to this.  Past Medical History:  Diagnosis Date   ADD (attention deficit disorder)     Anxiety    Asthma    Depression    Migraine    Panic attacks    Polycystic ovarian syndrome    Syncope    Neurocardiogenic suspected    Past Surgical History:  Procedure Laterality Date   ESOPHAGOGASTRODUODENOSCOPY     LAPAROTOMY N/A 09/07/2020   Procedure: EXPLORATORY LAPAROTOMY WITH EXTRACTION  OVARIAN CYST;  Surgeon: Florian Buff, MD;  Location: [facility];  Service: Gynecology;  Laterality: N/A;   ROUX-EN-Y GASTRIC BYPASS     August 2020 - [facility]   TOOTH EXTRACTION N/A 12/12/2015   Procedure: EXTRACTION MOLARS - teeth one, sixteen, seventeen and thirty-two;  Surgeon: Diona Browner, DDS;  Location: [facility];  Service: Oral Surgery;  Laterality: N/A;    Current Outpatient Medications  Medication Sig Dispense Refill   albuterol (VENTOLIN HFA) 108 (90 Base) MCG/ACT inhaler Inhale 2 puffs into the lungs 4 (four) times daily as needed for wheezing or shortness of breath.     escitalopram (LEXAPRO) 10 MG tablet Take 10 mg by mouth daily.     ferrous sulfate 325 (65 FE) MG tablet Take 325 mg by mouth daily with breakfast.     metoprolol succinate (TOPROL-XL) 25 MG 24 hr tablet Take 12.5 mg by mouth daily.     Multiple Vitamin (MULTIVITAMIN) tablet Take 1 tablet by mouth daily.     [name] 1-20 MG-MCG(24) tablet Take 1 tablet by mouth daily. (Patient not taking: Reported on 02/16/2022)     No current facility-administered  medications for this visit.   Allergies:  Patient has no known allergies.   ROS: No orthopnea or PND.  No chest pain.  Physical Exam: VS:  BP (!) 102/58    Pulse 85    Ht 5\' 4"  (1.626 m)    Wt 148 lb (67.1 kg)    SpO2 96%    BMI 25.40 kg/m , BMI Body mass index is 25.4 kg/m.  Wt Readings from Last 3 Encounters:  02/16/22 148 lb (67.1 kg)  01/16/22 147 lb 6.4 oz (66.9 kg)  12/28/21 147 lb 6.4 oz (66.9 kg)    General: Patient appears comfortable at rest. HEENT: Conjunctiva and lids normal, wearing a mask. Neck: Supple, no elevated JVP or carotid bruits, no  thyromegaly. Cardiac: Regular rate and rhythm, no S3 or significant systolic murmur, no pericardial rub.  ECG:  An ECG dated 01/16/2022 was personally reviewed today and demonstrated:  Sinus rhythm with repolarization changes and short PR interval.  Recent Labwork: 06/16/2021: TSH 1.040 01/16/2022: ALT 14; AST 24; BUN 12; Creatinine, Ser 0.69; Hemoglobin 11.9; Platelets 233; Potassium 4.1; Sodium 136   Other Studies Reviewed Today:  Echocardiogram 02/09/2020:  1. Left ventricular ejection fraction, by estimation, is 55%. The left  ventricle has normal function. The left ventricle has no regional wall  motion abnormalities. Left ventricular diastolic parameters were normal.   2. Right ventricular systolic function is normal. The right ventricular  size is normal. There is normal pulmonary artery systolic pressure. The  estimated right ventricular systolic pressure is 41.2 mmHg.   3. The mitral valve is grossly normal. No evidence of mitral valve  regurgitation.   4. The aortic valve is tricuspid. Aortic valve regurgitation is not  visualized.   5. The inferior vena cava is normal in size with greater than 50%  respiratory variability, suggesting right atrial pressure of 3 mmHg.   Cardiac monitor August 2022: [employer] monitor reviewed.  30 days analyzed.  Predominant rhythm is sinus with heart rate ranging from 54 bpm in sinus bradycardia up to 158 bpm in sinus tachycardia.  There were no obvious arrhythmias documented, no pauses.  Symptoms reported as rapid heartbeat and dizziness did not necessarily correspond to a particular heart rate.  Assessment and Plan:  Recurrent syncope, at this point neurocardiogenic etiology is suspected based on description.  She was not orthostatic today.  Although POTS had been considered in the past based on previous orthostatic vital signs, syncope is not a hallmark of this condition and would not be a full explanation.  Arrhythmia still is not suspected to  be of high likelihood.  She just wore a cardiac monitor per Dr. Saralyn Pilar and plans to follow-up with him in about a week for review.  For now continue Toprol-XL at current dose.  I would suggest starting midodrine 2.5 mg twice daily next if cardiac monitor shows no arrhythmia.  She also has pending neurology consultation.  We have already discussed adequate hydration and liberalizing salt.  Follow-up arranged.   Medication Adjustments/Labs and Tests Ordered: Current medicines are reviewed at length with the patient today.  Concerns regarding medicines are outlined above.   Tests Ordered: No orders of the defined types were placed in this encounter.   Medication Changes: No orders of the defined types were placed in this encounter.   Disposition:  Follow up  1 month.  Signed, Satira Sark, MD, [facility] 02/16/2022 2:49 PM    [facility] [address] [place], [place] [address] Phone: [phone]; Fax: [phone]

## 2022-02-16 NOTE — Patient Instructions (Signed)
Medication Instructions:  Your physician recommends that you continue on your current medications as directed. Please refer to the Current Medication list given to you today.   Labwork: None today  Testing/Procedures: None today  Follow-Up: 1 month  Any Other Special Instructions Will Be Listed Below (If Applicable).  If you need a refill on your cardiac medications before your next appointment, please call your pharmacy.  

## 2022-02-22 ENCOUNTER — Telehealth: Payer: Self-pay | Admitting: Cardiology

## 2022-02-22 NOTE — Telephone Encounter (Signed)
Patient went to her 2 opinion today and wanted Dr. Domenic Polite to review that information on chart. As well she calling in bout her FMLA papers. Please advise  ?

## 2022-02-22 NOTE — Telephone Encounter (Signed)
Patient called stating that she was seen by a Cardiologist at [facility] 02/22/2022. She is wanting to know if Dr. Domenic Polite wants to have the doctor at [facility] to complete the [facility] paperwork . ?

## 2022-02-22 NOTE — Telephone Encounter (Signed)
Spoke to pt who verbalized understanding.  

## 2022-02-26 ENCOUNTER — Institutional Professional Consult (permissible substitution): Payer: Medicaid Other | Admitting: Neurology

## 2022-02-26 ENCOUNTER — Ambulatory Visit: Payer: Medicaid Other | Admitting: Neurology

## 2022-02-26 ENCOUNTER — Encounter: Payer: Self-pay | Admitting: Neurology

## 2022-02-26 VITALS — BP 106/64 | HR 66 | Ht 64.0 in | Wt 149.5 lb

## 2022-02-26 DIAGNOSIS — R55 Syncope and collapse: Secondary | ICD-10-CM | POA: Diagnosis not present

## 2022-02-26 DIAGNOSIS — R258 Other abnormal involuntary movements: Secondary | ICD-10-CM

## 2022-02-26 MED ORDER — LAMOTRIGINE 100 MG PO TABS: 100.0000 mg | ORAL_TABLET | Freq: Two times a day (BID) | ORAL | 11 refills | Status: DC

## 2022-02-26 MED ORDER — LAMOTRIGINE 25 MG PO TABS: ORAL_TABLET | ORAL | 0 refills | Status: DC

## 2022-02-26 NOTE — Progress Notes (Signed)
Chief Complaint  Patient presents with   New Patient (Initial Visit)    Room 14 w/ boyfriend, Vickie Sutton. Reports having multiple events of the following symptoms: blurred vision, increased heart rate, passing out. Not a lot of memory during the height of episode. Witnessed muscle locking, slurred speech, disorientation. Typically, 2-5 minutes. Takes a couple of hours to return to baseline.       ASSESSMENT AND PLAN  Vickie Sutton is a 21 y.o. female  Daytime syncope episode  That is associated with tachycardia, improved by beta-blocker metoprolol Nighttime body jerking movement, difficulty breathing  Family history of seizure  Need to rule out possibility of nocturnal seizure, MRI of the brain with and without contrast  EEG  Discussed with patient, she preferred treatment, lamotrigine 25 mg titrating to 100 mg twice daily History of bariatric surgery with 170 pounds weight loss  Laboratory evaluation including TSH, B12  DIAGNOSTIC DATA (LABS, IMAGING, TESTING) - I reviewed patient records, labs, notes, testing and imaging myself where available. Laboratory evaluation in January 2023: CBC showed hemoglobin of 11.9, normal CMP  MEDICAL HISTORY:  Vickie Sutton is a 21 year old female, seen in request by her primary care PA Vickie Sutton for evaluation of passing out episode, she is accompanied by her boyfriend at today's visit February 26, 2022  I reviewed and summarized the referring note. PMHX. Syncope Axniety Bariatric surgery in 2019, lost her weight from 320 to149.  She had a history of morbidly obesity, bariatric surgery in 2019 weight loss from 320 to 149, laboratory evaluation in January 2023 showed mild anemia hemoglobin of 11.9  She had few years history of frequent passing out spells, presented with sudden onset tachycardia, blurry vision, fainting sensation, oftentimes she can brace herself, sliding down to the floor, even while driving, she would have enough  warning time to pull over, episodes usually last about 2 to 5 minutes, some severe spells are followed by extreme fatigue afterwards, lasting for couple hours  She was seen by cardiologist, 14 days Holter monitor in February 1-15 reviewed predominant sinus rhythm, intermittent sinus tachycardia, infrequent PACs and PVCs, and 17 beats atrial run, she was seen by [facility] cardiologist, diagnosed with tachycardia induced syncope, improved by beta-blocker treatment  2D echo previously showed no significant abnormality  In addition to the daytime passing out spells that is associated with heart palpitation, sometimes panic feelings, hyperventilation, she also have night time spells since 2023, was witnessed by her boyfriend, 2 similar episode, in her sleep, she had sudden body tensed up, jerking movements last for couple minutes, difficulty Ajovy awakening, no bowel or bladder incontinence  There was also a different episode, she suddenly stopped breathing, holding her breath for couple of minutes, body tensed up  Her maternal aunt had a history of seizure, her one-year-old nephew from her sister also has seizure her  PHYSICAL EXAM:   Vitals:   02/26/22 0724  BP: 106/64  Pulse: 66  Weight: 149 lb 8 oz (67.8 kg)  Height: '5\' 4"'$  (1.626 m)   Not recorded     Body mass index is 25.66 kg/m.  PHYSICAL EXAMNIATION:  Gen: NAD, conversant, well nourised, well groomed                     Cardiovascular: Regular rate rhythm, no peripheral edema, warm, nontender. Eyes: Conjunctivae clear without exudates or hemorrhage Neck: Supple, no carotid bruits. Pulmonary: Clear to auscultation bilaterally   NEUROLOGICAL EXAM:  MENTAL STATUS: Speech:  Speech is normal; fluent and spontaneous with normal comprehension.  Cognition:     Orientation to time, place and person     Normal recent and remote memory     Normal Attention span and concentration     Normal Language, naming, repeating,spontaneous  speech     Fund of knowledge   CRANIAL NERVES: CN II: Visual fields are full to confrontation. Pupils are round equal and briskly reactive to light. CN III, IV, VI: extraocular movement are normal. No ptosis. CN V: Facial sensation is intact to light touch CN VII: Face is symmetric with normal eye closure  CN VIII: Hearing is normal to causal conversation. CN IX, X: Phonation is normal. CN XI: Head turning and shoulder shrug are intact  MOTOR: There is no pronator drift of out-stretched arms. Muscle bulk and tone are normal. Muscle strength is normal.  REFLEXES: Reflexes are 2+ and symmetric at the biceps, triceps, knees, and ankles. Plantar responses are flexor.  SENSORY: Intact to light touch, pinprick and vibratory sensation are intact in fingers and toes.  COORDINATION: There is no trunk or limb dysmetria noted.  GAIT/STANCE: Posture is normal. Gait is steady with normal steps, base, arm swing, and turning. Heel and toe walking are normal. Tandem gait is normal.  Romberg is absent.  REVIEW OF SYSTEMS:  Full 14 system review of systems performed and notable only for as above All other review of systems were negative.   ALLERGIES: No Known Allergies  HOME MEDICATIONS: Current Outpatient Medications  Medication Sig Dispense Refill   albuterol (VENTOLIN HFA) 108 (90 Base) MCG/ACT inhaler Inhale 2 puffs into the lungs 4 (four) times daily as needed for wheezing or shortness of breath.     escitalopram (LEXAPRO) 10 MG tablet Take 10 mg by mouth daily.     ferrous sulfate 325 (65 FE) MG tablet Take 325 mg by mouth daily with breakfast.     [name] 1-20 MG-MCG(24) tablet Take 1 tablet by mouth daily.     metoprolol succinate (TOPROL-XL) 25 MG 24 hr tablet Take 12.5 mg by mouth daily.     Multiple Vitamin (MULTIVITAMIN) tablet Take 1 tablet by mouth daily.     No current facility-administered medications for this visit.    PAST MEDICAL HISTORY: Past Medical History:   Diagnosis Date   ADD (attention deficit disorder)    Anxiety    Asthma    Depression    Migraine    Panic attacks    Polycystic ovarian syndrome    POTS (postural orthostatic tachycardia syndrome)    Syncope    Neurocardiogenic suspected    PAST SURGICAL HISTORY: Past Surgical History:  Procedure Laterality Date   ESOPHAGOGASTRODUODENOSCOPY     LAPAROTOMY N/A 09/07/2020   Procedure: EXPLORATORY LAPAROTOMY WITH EXTRACTION  OVARIAN CYST;  Surgeon: Florian Buff, MD;  Location: [facility];  Service: Gynecology;  Laterality: N/A;   ROUX-EN-Y GASTRIC BYPASS     August 2020 - [facility]   TOOTH EXTRACTION N/A 12/12/2015   Procedure: EXTRACTION MOLARS - teeth one, sixteen, seventeen and thirty-two;  Surgeon: Diona Browner, DDS;  Location: [facility];  Service: Oral Surgery;  Laterality: N/A;    FAMILY HISTORY: Family History  Problem Relation Age of Onset   Diabetes Mellitus II Maternal Grandmother    Heart disease Maternal Grandmother    Obesity Mother    Delorse Limber White syndrome Brother    Seizures Maternal Aunt    Pneumonia Son  SOCIAL HISTORY: Social History   Socioeconomic History   Marital status: Single    Spouse name: Not on file   Number of children: 1   Years of education: some college   Highest education level: Not on file  Occupational History   Occupation: Chartered certified accountant at [employer]  Tobacco Use   Smoking status: Never   Smokeless tobacco: Never  Vaping Use   Vaping Use: Never used  Substance and Sexual Activity   Alcohol use: No   Drug use: No   Sexual activity: Not Currently    Birth control/protection: Pill  Other Topics Concern   Not on file  Social History Narrative   Right-handed.   No daily caffeine.   Lives at home with boyfriend and son.    Social Determinants of Health   Financial Resource Strain: Medium Risk   Difficulty of Paying Living Expenses: Somewhat hard  Food Insecurity: No Food Insecurity   Worried About Charity fundraiser in the Last  Year: Never true   Ran Out of Food in the Last Year: Never true  Transportation Needs: No Transportation Needs   Lack of Transportation (Medical): No   Lack of Transportation (Non-Medical): No  Physical Activity: Insufficiently Active   Days of Exercise per Week: 3 days   Minutes of Exercise per Session: 30 min  Stress: Stress Concern Present   Feeling of Stress : Rather much  Social Connections: Socially Isolated   Frequency of Communication with Friends and Family: More than three times a week   Frequency of Social Gatherings with Friends and Family: Three times a week   Attends Religious Services: Never   Active Member of Clubs or Organizations: No   Attends Archivist Meetings: Never   Marital Status: Never married  Human resources officer Violence: Not At Risk   Fear of Current or Ex-Partner: No   Emotionally Abused: No   Physically Abused: No   Sexually Abused: No      Marcial Pacas, M.D. Ph.D.  [facility] [address], [place], [place] [address] Ph: [phone] Fax: [phone]  CC:  Vickie Bal, PA-C [place],  [place] [address]  Rosalee Kaufman, [place]

## 2022-02-27 ENCOUNTER — Telehealth: Payer: Self-pay | Admitting: Neurology

## 2022-02-27 LAB — VITAMIN D 25 HYDROXY (VIT D DEFICIENCY, FRACTURES): Vit D, 25-Hydroxy: 25 ng/mL — ABNORMAL LOW (ref 30.0–100.0)

## 2022-02-27 LAB — FOLATE: Folate: 8.5 ng/mL (ref >3.0)

## 2022-02-27 LAB — TSH: TSH: 1.61 u[IU]/mL (ref 0.450–4.500)

## 2022-02-27 LAB — VITAMIN B12: Vitamin B-12: 264 pg/mL (ref 232–1245)

## 2022-02-27 LAB — FERRITIN: Ferritin: 6 ng/mL — ABNORMAL LOW (ref 15–150)

## 2022-02-27 LAB — RPR: RPR Ser Ql: NONREACTIVE

## 2022-02-27 NOTE — Telephone Encounter (Signed)
Left voicemail for pt (as per DPR) informed of results. Informed pt of decreased ferritin and vitamin D level. Instructed pt to begin supplements and follow up with PCP for further guidance. Left office number for a call back with any questions or concerns. ?

## 2022-02-27 NOTE — Telephone Encounter (Signed)
Please call patient, laboratory evaluation showed ? ?Significantly decreased ferritin, level is only 6, I have forwarded the lab result to her primary care Vickie Kaufman, PA-C she should start over-the-counter iron supplement now, and contact her primary care for further guidance ? ?Vitamin D deficiency, level was mildly decreased 25, she would benefit over-the-counter vitamin D3 supplement, 1000 units daily ? ?Rest of the laboratory evaluation showed no significant abnormalities. ?

## 2022-02-28 ENCOUNTER — Telehealth: Payer: Self-pay | Admitting: Neurology

## 2022-02-28 NOTE — Telephone Encounter (Signed)
[facility] pending faxed notes.  °

## 2022-03-05 ENCOUNTER — Ambulatory Visit: Payer: Medicaid Other | Admitting: Neurology

## 2022-03-05 DIAGNOSIS — R55 Syncope and collapse: Secondary | ICD-10-CM | POA: Diagnosis not present

## 2022-03-05 DIAGNOSIS — R258 Other abnormal involuntary movements: Secondary | ICD-10-CM

## 2022-03-07 NOTE — Telephone Encounter (Signed)
[facility]m Kaufmann: [id number] (exp. 02/28/22 to 04/29/22) order sent to GI. They will reach out to the patient to schedule.  ?

## 2022-03-14 ENCOUNTER — Ambulatory Visit: Payer: Medicaid Other | Admitting: Student

## 2022-03-20 ENCOUNTER — Ambulatory Visit: Payer: Medicaid Other | Admitting: Student

## 2022-03-24 ENCOUNTER — Other Ambulatory Visit: Payer: Self-pay | Admitting: Neurology

## 2022-03-24 ENCOUNTER — Ambulatory Visit
Admission: RE | Admit: 2022-03-24 | Discharge: 2022-03-24 | Disposition: A | Discharge status: Home / Self Care | Payer: Medicaid Other | Source: Ambulatory Visit | Attending: Neurology | Admitting: Neurology

## 2022-03-24 DIAGNOSIS — R55 Syncope and collapse: Secondary | ICD-10-CM | POA: Diagnosis not present

## 2022-03-24 DIAGNOSIS — R258 Other abnormal involuntary movements: Secondary | ICD-10-CM

## 2022-03-26 ENCOUNTER — Other Ambulatory Visit: Payer: Medicaid Other

## 2022-03-26 ENCOUNTER — Other Ambulatory Visit (INDEPENDENT_AMBULATORY_CARE_PROVIDER_SITE_OTHER): Payer: Medicaid Other

## 2022-03-26 VITALS — BP 92/57 | HR 62 | Ht 65.0 in | Wt 148.6 lb

## 2022-03-26 DIAGNOSIS — Z3201 Encounter for pregnancy test, result positive: Secondary | ICD-10-CM

## 2022-03-26 DIAGNOSIS — Z3200 Encounter for pregnancy test, result unknown: Secondary | ICD-10-CM

## 2022-03-26 LAB — POCT URINE PREGNANCY: Preg Test, Ur: POSITIVE — AB

## 2022-03-26 NOTE — Progress Notes (Addendum)
? ?  NURSE VISIT- PREGNANCY CONFIRMATION  ? ?SUBJECTIVE:  ?Vickie Sutton is a 21 y.o. G58P1001 female at 34w0dby certain LMP of Patient's last menstrual period was 02/19/2022 (exact date). Here for pregnancy confirmation.  Home pregnancy test: positive x 3   She reports no complaints.  She is taking prenatal vitamins.   ? ?OBJECTIVE:  ?BP (!) 92/57 (BP Location: Right Arm, Patient Position: Sitting, Cuff Size: Normal)   Pulse 62   Ht '5\' 5"'$  (1.651 m)   Wt 148 lb 9.6 oz (67.4 kg)   LMP 02/19/2022 (Exact Date)   Breastfeeding No   BMI 24.73 kg/m?   ?Appears well, in no apparent distress ? ?Results for orders placed or performed in visit on 03/26/22 (from the past 24 hour(s))  ?POCT urine pregnancy  ? Collection Time: 03/26/22 10:41 AM  ?Result Value Ref Range  ? Preg Test, Ur Positive (A) Negative  ? ? ?ASSESSMENT: ?Positive pregnancy test, 528w0dy LMP   ? ?PLAN: ?Schedule for dating ultrasound in 2-4 weeks ?Prenatal vitamins: continue   ?Nausea medicines: not currently needed   ?OB packet given: Yes ? ?Gita Dilger A Carmello Cabiness  ?03/26/2022 ?10:46 AM  ?

## 2022-03-28 ENCOUNTER — Ambulatory Visit: Payer: Medicaid Other | Admitting: Cardiology

## 2022-03-29 ENCOUNTER — Emergency Department (HOSPITAL_COMMUNITY)
Admission: EM | Admit: 2022-03-29 | Discharge: 2022-03-29 | Disposition: A | Discharge status: Home / Self Care | Payer: Medicaid Other | Attending: Emergency Medicine | Admitting: Emergency Medicine

## 2022-03-29 ENCOUNTER — Telehealth: Payer: Self-pay | Admitting: *Deleted

## 2022-03-29 ENCOUNTER — Emergency Department (HOSPITAL_COMMUNITY): Payer: Medicaid Other

## 2022-03-29 ENCOUNTER — Encounter (HOSPITAL_COMMUNITY): Payer: Self-pay | Admitting: *Deleted

## 2022-03-29 ENCOUNTER — Other Ambulatory Visit: Payer: Self-pay

## 2022-03-29 DIAGNOSIS — O209 Hemorrhage in early pregnancy, unspecified: Secondary | ICD-10-CM | POA: Insufficient documentation

## 2022-03-29 DIAGNOSIS — R102 Pelvic and perineal pain: Secondary | ICD-10-CM | POA: Insufficient documentation

## 2022-03-29 DIAGNOSIS — R5383 Other fatigue: Secondary | ICD-10-CM | POA: Diagnosis not present

## 2022-03-29 DIAGNOSIS — Z3A00 Weeks of gestation of pregnancy not specified: Secondary | ICD-10-CM | POA: Diagnosis not present

## 2022-03-29 DIAGNOSIS — O4690 Antepartum hemorrhage, unspecified, unspecified trimester: Secondary | ICD-10-CM

## 2022-03-29 LAB — URINALYSIS, ROUTINE W REFLEX MICROSCOPIC
Bilirubin Urine: NEGATIVE
Glucose, UA: NEGATIVE mg/dL
Hgb urine dipstick: NEGATIVE
Ketones, ur: NEGATIVE mg/dL
Nitrite: NEGATIVE
Protein, ur: NEGATIVE mg/dL
Specific Gravity, Urine: 1.02 (ref 1.005–1.030)
pH: 7 (ref 5.0–8.0)

## 2022-03-29 LAB — HCG, QUANTITATIVE, PREGNANCY: hCG, Beta Chain, Quant, S: 607 m[IU]/mL — ABNORMAL HIGH (ref <5)

## 2022-03-29 LAB — CBC WITH DIFFERENTIAL/PLATELET
Abs Immature Granulocytes: 0.04 10*3/uL (ref 0.00–0.07)
Basophils Absolute: 0 10*3/uL (ref 0.0–0.1)
Basophils Relative: 0 %
Eosinophils Absolute: 0 10*3/uL (ref 0.0–0.5)
Eosinophils Relative: 0 %
HCT: 39.5 % (ref 36.0–46.0)
Hemoglobin: 12.2 g/dL (ref 12.0–15.0)
Immature Granulocytes: 0 %
Lymphocytes Relative: 15 %
Lymphs Abs: 1.7 10*3/uL (ref 0.7–4.0)
MCH: 25.9 pg — ABNORMAL LOW (ref 26.0–34.0)
MCHC: 30.9 g/dL (ref 30.0–36.0)
MCV: 83.9 fL (ref 80.0–100.0)
Monocytes Absolute: 0.4 10*3/uL (ref 0.1–1.0)
Monocytes Relative: 3 %
Neutro Abs: 9.3 10*3/uL — ABNORMAL HIGH (ref 1.7–7.7)
Neutrophils Relative %: 82 %
Platelets: 229 10*3/uL (ref 150–400)
RBC: 4.71 MIL/uL (ref 3.87–5.11)
RDW: 15.1 % (ref 11.5–15.5)
WBC: 11.4 10*3/uL — ABNORMAL HIGH (ref 4.0–10.5)
nRBC: 0 % (ref 0.0–0.2)

## 2022-03-29 LAB — COMPREHENSIVE METABOLIC PANEL
ALT: 17 U/L (ref 0–44)
AST: 25 U/L (ref 15–41)
Albumin: 4.2 g/dL (ref 3.5–5.0)
Alkaline Phosphatase: 80 U/L (ref 38–126)
Anion gap: 10 (ref 5–15)
BUN: 12 mg/dL (ref 6–20)
CO2: 24 mmol/L (ref 22–32)
Calcium: 9.1 mg/dL (ref 8.9–10.3)
Chloride: 104 mmol/L (ref 98–111)
Creatinine, Ser: 0.53 mg/dL (ref 0.44–1.00)
GFR, Estimated: >60 mL/min (ref >60)
Glucose, Bld: 92 mg/dL (ref 70–99)
Potassium: 3.9 mmol/L (ref 3.5–5.1)
Sodium: 138 mmol/L (ref 135–145)
Total Bilirubin: 1 mg/dL (ref 0.3–1.2)
Total Protein: 7.6 g/dL (ref 6.5–8.1)

## 2022-03-29 LAB — URINALYSIS, MICROSCOPIC (REFLEX): Bacteria, UA: NONE SEEN

## 2022-03-29 LAB — ABO/RH: ABO/RH(D): B POS

## 2022-03-29 IMAGING — DX DG CHEST 1V PORT
1 series · 1 of 1 positions shown · non-contrast
Comparison: None.

CLINICAL DATA: chest pain, near syncope

EXAM:
PORTABLE CHEST 1 VIEW

[chest ap]
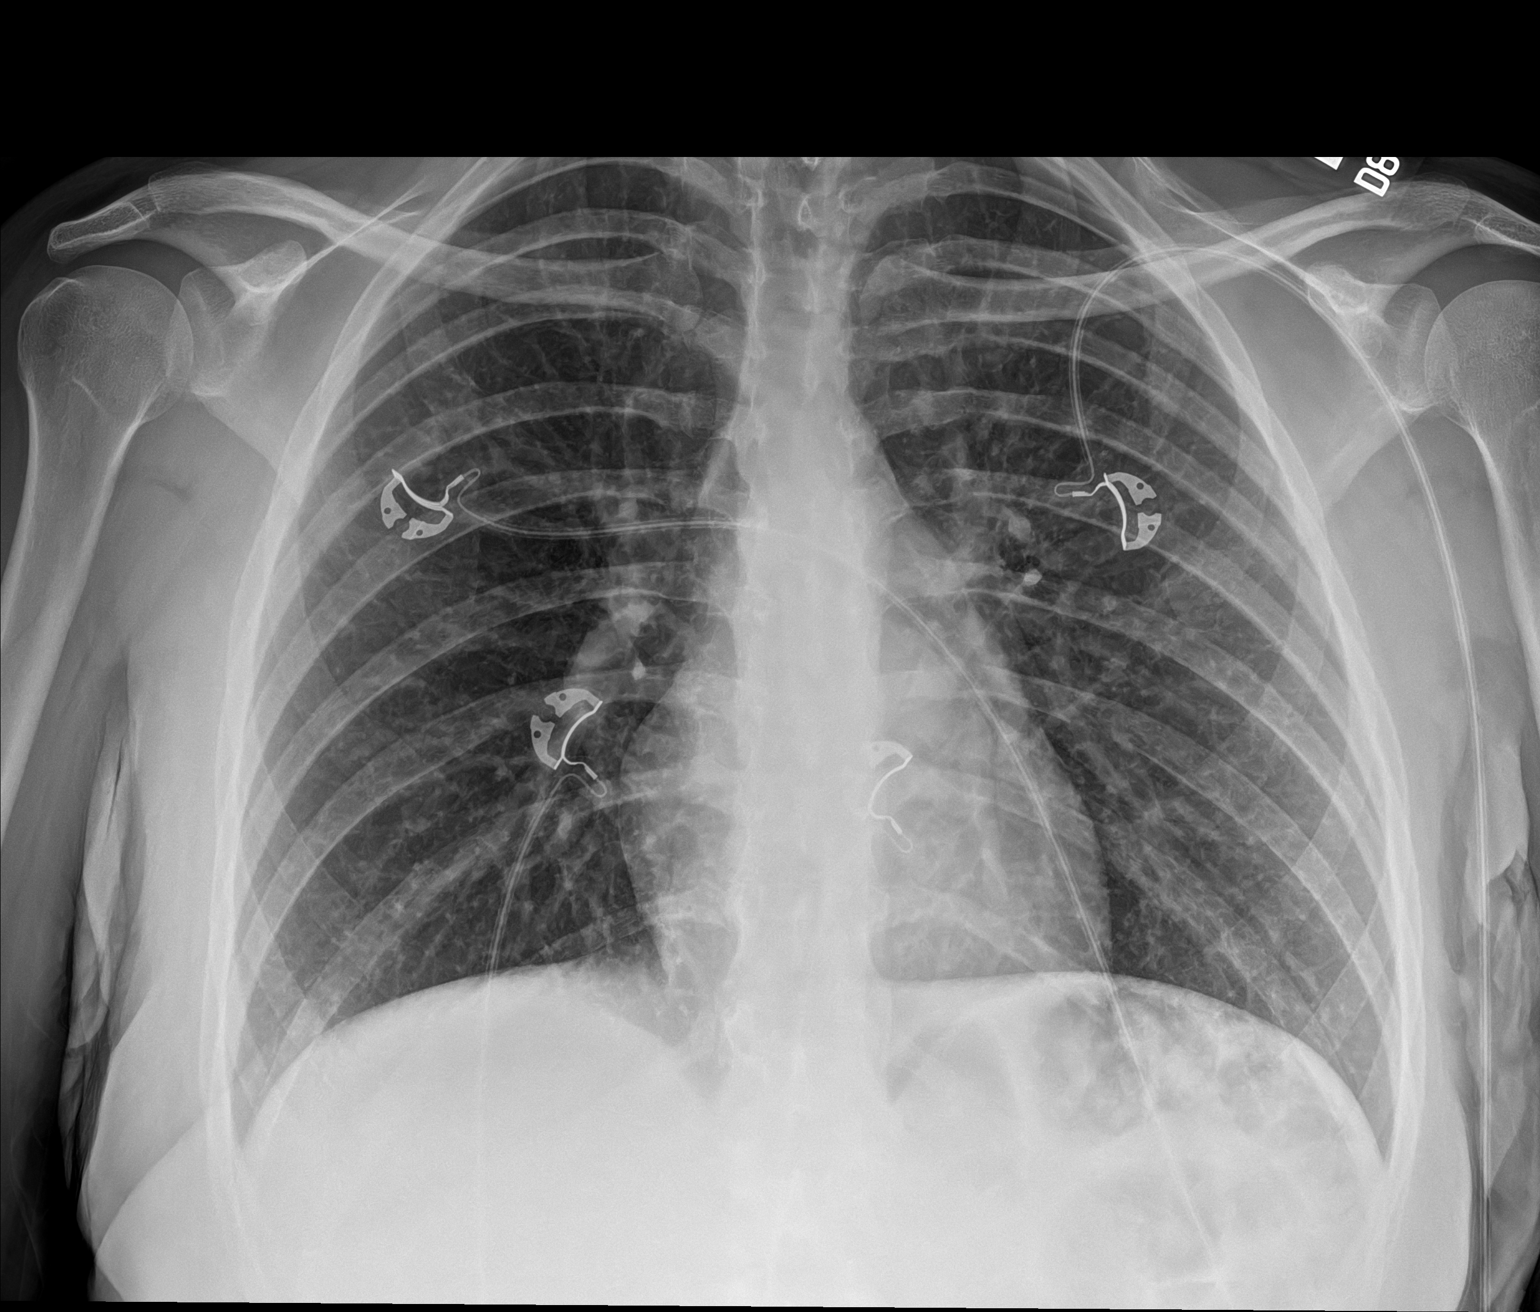

[1 of 1 positions shown; findings below may reference images not displayed]

FINDINGS: The cardiomediastinal silhouette is within normal limits. No pleural
effusion. No pneumothorax. No mass or consolidation. No acute
osseous abnormality.
IMPRESSION: No acute findings in the chest.

## 2022-03-29 MED ORDER — SODIUM CHLORIDE 0.9 % IV BOLUS
1000.0000 mL | Freq: Once | INTRAVENOUS | Status: AC
  Administered 2022-03-29: 1000 mL via INTRAVENOUS

## 2022-03-29 MED ORDER — DOXYLAMINE-PYRIDOXINE 10-10 MG PO TBEC: 2.0000 | DELAYED_RELEASE_TABLET | Freq: Every evening | ORAL | 0 refills | Status: DC | PRN

## 2022-03-29 NOTE — ED Provider Notes (Signed)
?[facility] ?Provider Note ? ? ?CSN: [id number] ?Arrival date & time: 03/29/22  3295 ? ?  ? ?History ? ?Chief Complaint  ?Patient presents with  ? Vaginal Bleeding  ? ? ?Vickie Sutton is a 21 y.o. female, LMP 02/19/22, confirmed pregnancy presenting for evaluation of low pelvic cramping describing mid pelvis pain which radiates into her bilateral lower pelvic regions in association with seeing traces of light blood but only when she wipes after urinating x2 days.  She denies dysuria or increased urinary frequency.  She states her pain reminds her of pain experienced with a large cyst that developed on her right ovary when she was pregnant with her first child and required surgical intervention.  Has hx of PCOS.  She denies fevers or chills.  She does have significant nausea and vomiting, usually midday which has been present for several days as well.  She did not have any nausea or vomiting symptoms with her first pregnancy.  She is currently on a prenatal vitamin.  She does endorse generalized fatigue.  She has a history of chronic problems with low blood pressure, tachycardia and episodes of syncope for which she is under the care of cardiology and neurology.  She was recently taken off of her metoprolol due to this new pregnancy.  She denies palpitations, chest pain shortness of breath. ? ?The history is provided by the patient and a significant other.  ? ?  ? ?Home Medications ?Prior to Admission medications   ?Medication Sig Start Date End Date Taking? Authorizing Provider  ?albuterol (VENTOLIN HFA) 108 (90 Base) MCG/ACT inhaler Inhale 2 puffs into the lungs 4 (four) times daily as needed for wheezing or shortness of breath.   Yes [provider]  ?Doxylamine-Pyridoxine (DICLEGIS) 10-10 MG TBEC Take 2 tablets by mouth at bedtime as needed (nausea). If nausea persists beyond the first 2 days, may increase to 1 tab PO qam 03/29/22  Yes Jesslyn Viglione, [name], PA-C  ?escitalopram (LEXAPRO) 10 MG  tablet Take 10 mg by mouth daily. 02/14/22  Yes [provider]  ?ferrous sulfate 325 (65 FE) MG tablet Take 325 mg by mouth daily with breakfast.   Yes [provider]  ?Multiple Vitamins-Minerals (ZINC PO) Take 1 tablet by mouth daily.   Yes [provider]  ?Prenatal Vit-Fe Fumarate-FA (PRENATAL MULTIVITAMIN) TABS tablet Take 1 tablet by mouth daily at 12 noon.   Yes [provider]  ?lamoTRIgine (LAMICTAL) 100 MG tablet Take 1 tablet (100 mg total) by mouth 2 (two) times daily. ?Patient not taking: Reported on 03/29/2022 02/26/22   Marcial Pacas, MD  ?   ? ?Allergies    ?Patient has no known allergies.   ? ?Review of Systems   ?Review of Systems  ?Constitutional:  Negative for fever.  ?HENT:  Negative for congestion and sore throat.   ?Eyes: Negative.   ?Respiratory:  Negative for chest tightness and shortness of breath.   ?Cardiovascular:  Negative for chest pain.  ?Gastrointestinal:  Positive for nausea and vomiting. Negative for abdominal pain.  ?Genitourinary:  Positive for pelvic pain. Negative for decreased urine volume, dysuria and vaginal discharge.  ?     ? Hematuria vs vaginal spotting  ?Musculoskeletal:  Negative for arthralgias, joint swelling and neck pain.  ?Skin: Negative.  Negative for rash and wound.  ?Neurological:  Negative for dizziness, weakness, light-headedness, numbness and headaches.  ?Psychiatric/Behavioral: Negative.    ? ?Physical Exam ?Updated Vital Signs ?BP (!) 100/58   Pulse 66  Temp 99.1 ?F (37.3 ?C) (Oral)   Resp 16   Ht '5\' 5"'$  (1.651 m)   Wt 67.1 kg   LMP 02/19/2022 (Exact Date)   SpO2 100%   BMI 24.63 kg/m?  ?Physical Exam ?Vitals and nursing note reviewed.  ?Constitutional:   ?   Appearance: She is well-developed.  ?HENT:  ?   Head: Normocephalic and atraumatic.  ?Eyes:  ?   Conjunctiva/sclera: Conjunctivae normal.  ?Cardiovascular:  ?   Rate and Rhythm: Normal rate and regular rhythm.  ?   Heart sounds: Normal heart sounds.  ?Pulmonary:  ?    Effort: Pulmonary effort is normal.  ?   Breath sounds: Normal breath sounds. No wheezing.  ?Abdominal:  ?   General: Abdomen is protuberant. Bowel sounds are normal.  ?   Palpations: Abdomen is soft.  ?   Tenderness: There is abdominal tenderness in the right lower quadrant, suprapubic area and left lower quadrant. There is no guarding or rebound.  ?Musculoskeletal:     ?   General: Normal range of motion.  ?   Cervical back: Normal range of motion.  ?Skin: ?   General: Skin is warm and dry.  ?Neurological:  ?   Mental Status: She is alert.  ? ? ?ED Results / Procedures / Treatments   ?Labs ?(all labs ordered are listed, but only abnormal results are displayed) ?Labs Reviewed  ?CBC WITH DIFFERENTIAL/PLATELET - Abnormal; Notable for the following components:  ?    Result Value  ? WBC 11.4 (*)   ? MCH 25.9 (*)   ? Neutro Abs 9.3 (*)   ? All other components within normal limits  ?HCG, QUANTITATIVE, PREGNANCY - Abnormal; Notable for the following components:  ? hCG, Beta Chain, Quant, S 607 (*)   ? All other components within normal limits  ?URINALYSIS, ROUTINE W REFLEX MICROSCOPIC - Abnormal; Notable for the following components:  ? Leukocytes,Ua SMALL (*)   ? All other components within normal limits  ?COMPREHENSIVE METABOLIC PANEL  ?URINALYSIS, MICROSCOPIC (REFLEX)  ?ABO/RH  ? ? ?EKG ?None ? ?Radiology ?US OB LESS THAN 14 WEEKS WITH OB TRANSVAGINAL ? ?Result Date: 03/29/2022 ?CLINICAL DATA:  Vaginal spotting. EXAM: OBSTETRIC <14 WK [place] AND TRANSVAGINAL OB US TECHNIQUE: Both transabdominal and transvaginal ultrasound examinations were performed for complete evaluation of the gestation as well as the maternal uterus, adnexal regions, and pelvic cul-de-sac. Transvaginal technique was performed to assess early pregnancy. COMPARISON:  None. FINDINGS: Intrauterine gestational sac: None Maternal uterus/adnexae: Thickened endometrium measuring 2.4 cm. Unremarkable appearance of the ovaries. No adnexal mass. Small volume  pelvic free fluid. IMPRESSION: 1. No intrauterine pregnancy or discrete ectopic pregnancy identified. Findings are consistent with pregnancy of unknown location and may reflect early intrauterine pregnancy not yet visualized sonographically, occult ectopic pregnancy, or failed pregnancy. 2. Thickened endometrium without focal finding. 3. Small volume pelvic free fluid. Electronically Signed   By: Logan Bores M.D.   On: 03/29/2022 11:36   ? ?Procedures ?Procedures  ? ? ?Medications Ordered in ED ?Medications  ?sodium chloride 0.9 % bolus 1,000 mL (0 mLs Intravenous Stopped 03/29/22 1420)  ? ? ?ED Course/ Medical Decision Making/ A&P ?  ?                        ?Medical Decision Making ?Pt with low pelvic cramping, midline with radiation to bilateral lower quads. Blood on toilet tissue only with wiping after urination, no dysuria.  LMP 2/27, with  non confirmatory intrauterine [place] today and hcg quant of 607, suspect 4-[redacted] week gestation, probably too early to detect with [place].  However, unable to r/o ectopic and/or threatened miscarriage given spotting.  Also could be early benign spotting with normal pregnancy.  Discussed all these possibilities with pt and partner at bedside.  Advised that she needs a repeat quantitiative blood test in 48 hours - will see her pcp for as they are open on Saturday.  She has next appt with her ob in 2 weeks.   ? ?Other possible dx including appendicitis, pt not febrile, no localizing pain to RLQ, so unlikely.  Ovarian cyst, uti, std - no complaint of vaginal dc or risk factors for infection.  ? ?Amount and/or Complexity of Data Reviewed ?Labs: ordered. ?   Details: labs reviewed, no excessive dehydration,hcg quant 607, prob c/w 4-5 gestation weeks given LMP.  leukocytosis of 11.4 - may be acute reaction to n/v.  pt has been afebrile, doubt infectious process. RH+, no rhogam indicated. ?Radiology: ordered. ?   Details: [place] results reviewed and discussed with pt ? ?Risk ?OTC drugs. ?Prescription  drug management. ?Decision regarding hospitalization. ? ? ? ? ? ? ? ? ? ? ?Final Clinical Impression(s) / ED Diagnoses ?Final diagnoses:  ?Vaginal bleeding in pregnancy  ? ? ?Rx / DC Orders ?ED Discharge Orders   ?

## 2022-03-29 NOTE — ED Triage Notes (Signed)
Pt c/o vaginal bleeding, mid abdominal pain and n/v x 2 days. Pt sees Wells Guiles, CNM for her OB care.  ?

## 2022-03-29 NOTE — Discharge Instructions (Signed)
You may take the Diclegis if needed for continued nausea and vomiting.  As discussed today's ultrasound does not confirm an intrauterine pregnancy and therefore we can also not rule out the possibility of an ectopic pregnancy.  You are early enough in your pregnancy that this is not an unusual finding.  According to your hCG quantitative blood test today it appears you may be 4 to 5 weeks into your pregnancy, this blood test today is 607, and should double within the next 2 to 3 days.  Having this repeated on Saturday will help confirm that your pregnancy is progressing in the right direction.  As discussed also, it can be normal to have spotting early in pregnancy, but sometimes is an early sign of miscarriage.  Please follow-[facility] with [facility] as discussed. ?

## 2022-03-29 NOTE — Telephone Encounter (Signed)
Patient called with c/o wiping bright red blood after urinating yesterday morning along with painful cramping. She started vomiting yesterday afternoon as well and is feeling weak and has been told by her coworkers that she looks pale.  States she is urinating but feels dehydrated.  She noticed "black specks" in her vomit and seem to be having tachycardia.  She was recently advise to stop taking her medication for POTS as it could cause heart defects in the baby.  Advised patient that since she is currently working at [facility] [employer], to go to the ED for evaluation.  Patient stated she would and will let us know if anything changes.  ? ?

## 2022-03-29 NOTE — ED Notes (Addendum)
PA notified of low bp. Orthostatics done and pt remains stable without dizziness at this time. PA states pt is low at baseline and stable for dc at this time as long as pt is not symptomatic with orthostatics. Instructed pt to return if symptomatic after dc  ?

## 2022-03-29 NOTE — ED Notes (Signed)
Pt reports approx [redacted] weeks pregnant, has confirmed pregnancy with OB but has not had her first appt yet. She states she began spotting with bright red blood along with cramping, n/v, approx 2 days ago. She does have to wear a pad for spotting but is not filling pad with blood. She states she has trouble keeping much of anything down over the past few days including fluids, but was able to keep a small portion of her dinner down last night. LBM this morning, normal/brown/formed. She is concerned as well about taking her metoprolol for POTS as OB told her it could cause birth defects so she has not been taking it. No other bleeding noted. Abdominal pain spans across abdomen at level of her navel, does not stay in one spot. States she have vomited too many times to count over past 2 days. Last episode of vomiting noted to have "white foam and dark brown specks", which pt provided a picture of. Pt concerned about viability of fetus.  ?

## 2022-04-01 NOTE — Procedures (Signed)
? ?  HISTORY: 21 years old female presenting with passing out episode ? ?TECHNIQUE:  ?This is a routine 16 channel EEG recording with one channel devoted to a limited EKG recording.  It was performed during wakefulness, drowsiness and asleep.  Hyperventilation and photic stimulation were performed as activating procedures.  There are minimum muscle and movement artifact noted. ? ?Upon maximum arousal, posterior dominant waking rhythm consistent of rhythmic alpha range activity, with frequency of 11 hz. Activities are symmetric over the bilateral posterior derivations and attenuated with eye opening. ? ?Hyperventilation produced mild/moderate buildup with higher amplitude and the slower activities noted. ? ?Photic stimulation did not alter the tracing. ? ?During EEG recording, patient developed drowsiness and deeper stage of sleep was achieved ?During EEG recording, there was no epileptiform discharge noted. ? ?EKG demonstrate sinus rhythm, with heart rate of 56 bpm no ? ?CONCLUSION: ?This is a  normal awake EEG.  There is no electrodiagnostic evidence of epileptiform discharge. ? ?Marcial Pacas, M.D. Ph.D. ? ?[facility] ?[place], [place] [address] ?Phone: [phone] ?Fax:      [phone]  ?

## 2022-04-12 ENCOUNTER — Ambulatory Visit: Payer: 59 | Admitting: Internal Medicine

## 2022-04-18 ENCOUNTER — Telehealth: Payer: Self-pay | Admitting: *Deleted

## 2022-04-18 ENCOUNTER — Other Ambulatory Visit: Payer: Self-pay | Admitting: Obstetrics & Gynecology

## 2022-04-18 ENCOUNTER — Encounter: Payer: Self-pay | Admitting: *Deleted

## 2022-04-18 DIAGNOSIS — O4690 Antepartum hemorrhage, unspecified, unspecified trimester: Secondary | ICD-10-CM

## 2022-04-18 DIAGNOSIS — O3680X0 Pregnancy with inconclusive fetal viability, not applicable or unspecified: Secondary | ICD-10-CM

## 2022-04-18 NOTE — Telephone Encounter (Signed)
Called patient to see if she could come have HCG drawn before ultrasound tomorrow. ?

## 2022-04-19 ENCOUNTER — Ambulatory Visit (INDEPENDENT_AMBULATORY_CARE_PROVIDER_SITE_OTHER): Payer: Medicaid Other

## 2022-04-19 DIAGNOSIS — O3680X0 Pregnancy with inconclusive fetal viability, not applicable or unspecified: Secondary | ICD-10-CM | POA: Diagnosis not present

## 2022-04-19 NOTE — Progress Notes (Signed)
[place] 7+4 wks,single IUP with yolk sac,crl 12.99 mm,FHR 150 bpm,normal ovaries ?

## 2022-04-21 ENCOUNTER — Inpatient Hospital Stay (HOSPITAL_COMMUNITY)
Admission: AD | Admit: 2022-04-21 | Discharge: 2022-04-21 | Disposition: A | Discharge status: Home / Self Care | Payer: Medicaid Other | Attending: Obstetrics & Gynecology | Admitting: Obstetrics & Gynecology

## 2022-04-21 ENCOUNTER — Inpatient Hospital Stay (HOSPITAL_COMMUNITY): Payer: Medicaid Other

## 2022-04-21 ENCOUNTER — Encounter (HOSPITAL_COMMUNITY): Payer: Self-pay | Admitting: Obstetrics & Gynecology

## 2022-04-21 DIAGNOSIS — B9689 Other specified bacterial agents as the cause of diseases classified elsewhere: Secondary | ICD-10-CM | POA: Diagnosis not present

## 2022-04-21 DIAGNOSIS — N760 Acute vaginitis: Secondary | ICD-10-CM | POA: Diagnosis not present

## 2022-04-21 DIAGNOSIS — O219 Vomiting of pregnancy, unspecified: Secondary | ICD-10-CM

## 2022-04-21 DIAGNOSIS — O26891 Other specified pregnancy related conditions, first trimester: Secondary | ICD-10-CM

## 2022-04-21 DIAGNOSIS — Z3A01 Less than 8 weeks gestation of pregnancy: Secondary | ICD-10-CM

## 2022-04-21 DIAGNOSIS — O209 Hemorrhage in early pregnancy, unspecified: Secondary | ICD-10-CM | POA: Diagnosis present

## 2022-04-21 DIAGNOSIS — R109 Unspecified abdominal pain: Secondary | ICD-10-CM | POA: Diagnosis present

## 2022-04-21 DIAGNOSIS — O23591 Infection of other part of genital tract in pregnancy, first trimester: Secondary | ICD-10-CM | POA: Diagnosis not present

## 2022-04-21 LAB — URINALYSIS, ROUTINE W REFLEX MICROSCOPIC
Bilirubin Urine: NEGATIVE
Glucose, UA: NEGATIVE mg/dL
Hgb urine dipstick: NEGATIVE
Ketones, ur: NEGATIVE mg/dL
Nitrite: NEGATIVE
Protein, ur: NEGATIVE mg/dL
Specific Gravity, Urine: 1.025 (ref 1.005–1.030)
pH: 7 (ref 5.0–8.0)

## 2022-04-21 LAB — COMPREHENSIVE METABOLIC PANEL
ALT: 13 U/L (ref 0–44)
AST: 21 U/L (ref 15–41)
Albumin: 4 g/dL (ref 3.5–5.0)
Alkaline Phosphatase: 73 U/L (ref 38–126)
Anion gap: 7 (ref 5–15)
BUN: 8 mg/dL (ref 6–20)
CO2: 23 mmol/L (ref 22–32)
Calcium: 9.2 mg/dL (ref 8.9–10.3)
Chloride: 106 mmol/L (ref 98–111)
Creatinine, Ser: 0.53 mg/dL (ref 0.44–1.00)
GFR, Estimated: >60 mL/min (ref >60)
Glucose, Bld: 103 mg/dL — ABNORMAL HIGH (ref 70–99)
Potassium: 3.8 mmol/L (ref 3.5–5.1)
Sodium: 136 mmol/L (ref 135–145)
Total Bilirubin: 0.6 mg/dL (ref 0.3–1.2)
Total Protein: 7 g/dL (ref 6.5–8.1)

## 2022-04-21 LAB — WET PREP, GENITAL
Sperm: NONE SEEN
Trich, Wet Prep: NONE SEEN
WBC, Wet Prep HPF POC: >=10 — AB (ref <10)
Yeast Wet Prep HPF POC: NONE SEEN

## 2022-04-21 LAB — CBC
HCT: 35.4 % — ABNORMAL LOW (ref 36.0–46.0)
Hemoglobin: 11.5 g/dL — ABNORMAL LOW (ref 12.0–15.0)
MCH: 26.7 pg (ref 26.0–34.0)
MCHC: 32.5 g/dL (ref 30.0–36.0)
MCV: 82.1 fL (ref 80.0–100.0)
Platelets: 229 10*3/uL (ref 150–400)
RBC: 4.31 MIL/uL (ref 3.87–5.11)
RDW: 15.2 % (ref 11.5–15.5)
WBC: 9.4 10*3/uL (ref 4.0–10.5)
nRBC: 0 % (ref 0.0–0.2)

## 2022-04-21 LAB — HCG, QUANTITATIVE, PREGNANCY: hCG, Beta Chain, Quant, S: 65222 m[IU]/mL — ABNORMAL HIGH (ref <5)

## 2022-04-21 MED ORDER — PROMETHAZINE HCL 25 MG PO TABS
25.0000 mg | ORAL_TABLET | Freq: Once | ORAL | Status: AC
  Administered 2022-04-21: 25 mg via ORAL
  Filled 2022-04-21: qty 1

## 2022-04-21 MED ORDER — METRONIDAZOLE 0.75 % VA GEL
1.0000 | Freq: Every day | VAGINAL | 0 refills | Status: DC
Start: 2022-04-21 — End: 2022-05-24

## 2022-04-21 MED ORDER — ACETAMINOPHEN 500 MG PO TABS
1000.0000 mg | ORAL_TABLET | Freq: Once | ORAL | Status: AC
Start: 2022-04-21 — End: 2022-04-21
  Administered 2022-04-21: 1000 mg via ORAL
  Filled 2022-04-21: qty 2

## 2022-04-21 NOTE — MAU Note (Signed)
.  Vickie Sutton is a 21 y.o. at 22w6dhere in MAU reporting sharp abdominal pain Friday with some n/v. Has had nausea entire pregnancy but vomited 2-3 times which is different. Normal BM.  ?LMP: 718w6dOnset of complaint: Friday am ?Pain score: 10 ?Vitals:  ? 04/21/22 0358 04/21/22 0401  ?BP:  114/65  ?Pulse: 67   ?Resp: 17   ?Temp: 98.1 ?F (36.7 ?C)   ?SpO2: 100%   ?   ?FHT:n/a ?Lab orders placed from triage: u/a  ? ?

## 2022-04-21 NOTE — MAU Provider Note (Signed)
?History  ?  ? ?CSN: [id number] ? ?Arrival date and time: 04/21/22 0315 ? ? Event Date/Time  ? First Provider Initiated Contact with Patient 04/21/22 [phone]   ?  ? ?Chief Complaint  ?Patient presents with  ? Abdominal Pain  ? Emesis During Pregnancy  ? Vaginal Bleeding  ? ?Vickie Sutton is a 21 y.o. G2P1001 at 46w6dwho receives care at [facility].  She presents today for Abdominal Pain and Vaginal Bleeding.  She states she started having abdominal pain this morning and has been constant sharp.  She reports the pain is worse with pressure and improved with current position.  However, she rates he pain a 9/10. She denies issues with urination, but has been experiencing nausea and is currently nauseous.  She reports she has had some vaginal spotting, but contributes this to work related lifting, bending, and pulling.  She states she last had spotting on Wednesday with wiping and noted blood in the toilet.  She reports placing a pantyliner with some "dribble on it, but not enough to fill it up."   states she has had a runny stool this afternoon.  Patient denies recent sex. ? ? ?OB History   ? ? Gravida  ?2  ? Para  ?1  ? Term  ?1  ? Preterm  ?   ? AB  ?   ? Living  ?1  ?  ? ? SAB  ?   ? IAB  ?   ? Ectopic  ?   ? Multiple  ?0  ? Live Births  ?1  ?   ?  ?  ? ? ?Past Medical History:  ?Diagnosis Date  ? ADD (attention deficit disorder)   ? Anxiety   ? Asthma   ? Depression   ? Migraine   ? Panic attacks   ? Polycystic ovarian syndrome   ? POTS (postural orthostatic tachycardia syndrome)   ? Syncope   ? Neurocardiogenic suspected  ? ? ?Past Surgical History:  ?Procedure Laterality Date  ? ESOPHAGOGASTRODUODENOSCOPY    ? LAPAROTOMY N/A 09/07/2020  ? Procedure: EXPLORATORY LAPAROTOMY WITH EXTRACTION  OVARIAN CYST;  Surgeon: EFlorian Buff MD;  Location: [facility]  Service: Gynecology;  Laterality: N/A;  ? OVARIAN CYST SURGERY Right   ? ROUX-EN-Y GASTRIC BYPASS    ? August 2020 - [facility]  ? TOOTH EXTRACTION N/A 12/12/2015  ? Procedure:  EXTRACTION MOLARS - teeth one, sixteen, seventeen and thirty-two;  Surgeon: SDiona Browner DDS;  [facility]  Service: Oral Surgery;  Laterality: N/A;  ? ? ?Family History  ?Problem Relation Age of Onset  ? Diabetes Mellitus II Maternal Grandmother   ? Heart disease Maternal Grandmother   ? Obesity Mother   ? WYves DillParkinson White syndrome Brother   ? Seizures Maternal Aunt   ? Pneumonia Son   ? ? ?Social History  ? ?Tobacco Use  ? Smoking status: Never  ? Smokeless tobacco: Never  ?Vaping Use  ? Vaping Use: Never used  ?Substance Use Topics  ? Alcohol use: No  ? Drug use: No  ? ? ?Allergies: No Known Allergies ? ?Medications Prior to Admission  ?Medication Sig Dispense Refill Last Dose  ? escitalopram (LEXAPRO) 10 MG tablet Take 20 mg by mouth daily.   04/20/2022  ? Prenatal Vit-Fe Fumarate-FA (PRENATAL MULTIVITAMIN) TABS tablet Take 1 tablet by mouth daily at 12 noon.   04/20/2022  ? albuterol (VENTOLIN HFA) 108 (90 Base) MCG/ACT inhaler Inhale 2 puffs into the lungs  4 (four) times daily as needed for wheezing or shortness of breath.   More than a month  ? Doxylamine-Pyridoxine (DICLEGIS) 10-10 MG TBEC Take 2 tablets by mouth at bedtime as needed (nausea). If nausea persists beyond the first 2 days, may increase to 1 tab PO qam 60 tablet 0   ? ferrous sulfate 325 (65 FE) MG tablet Take 325 mg by mouth daily with breakfast.     ? lamoTRIgine (LAMICTAL) 100 MG tablet Take 1 tablet (100 mg total) by mouth 2 (two) times daily. (Patient not taking: Reported on 03/29/2022) 60 tablet 11   ? Multiple Vitamins-Minerals (ZINC PO) Take 1 tablet by mouth daily.     ? ? ?Review of Systems  ?Gastrointestinal:  Positive for abdominal pain, diarrhea and nausea. Negative for vomiting.  ?Genitourinary:  Negative for difficulty urinating, dysuria, vaginal bleeding and vaginal discharge.  ?Neurological:  Positive for dizziness (Contributes to vasovagal syncope sx) and light-headedness. Negative for headaches.  ?Physical Exam   ? ?Blood pressure 114/65, pulse 67, temperature 98.1 ?F (36.7 ?C), resp. rate 17, height '5\' 5"'$  (1.651 m), weight 68.5 kg, last menstrual period 02/19/2022, SpO2 100 %, not currently breastfeeding. ? ?Physical Exam ?Vitals reviewed.  ?Constitutional:   ?   Appearance: Normal appearance. She is well-developed.  ?HENT:  ?   Head: Normocephalic and atraumatic.  ?Eyes:  ?   Conjunctiva/sclera: Conjunctivae normal.  ?Cardiovascular:  ?   Rate and Rhythm: Normal rate.  ?Pulmonary:  ?   Effort: Pulmonary effort is normal. No respiratory distress.  ?Abdominal:  ?   Palpations: Abdomen is soft.  ?   Tenderness: There is abdominal tenderness in the right lower quadrant and left lower quadrant.  ?   Hernia: No hernia is present.  ?Musculoskeletal:     ?   General: Normal range of motion.  ?Skin: ?   General: Skin is warm and dry.  ?Neurological:  ?   Mental Status: She is alert and oriented to person, place, and time.  ?Psychiatric:     ?   Mood and Affect: Mood normal.     ?   Behavior: Behavior normal.  ? ? ?MAU Course  ?Procedures ?Results for orders placed or performed during the hospital encounter of 04/21/22 (from the past 24 hour(s))  ?Urinalysis, Routine w reflex microscopic Urine, Clean Catch     Status: Abnormal  ? Collection Time: 04/21/22  3:55 AM  ?Result Value Ref Range  ? Color, Urine YELLOW YELLOW  ? APPearance HAZY (A) CLEAR  ? Specific Gravity, Urine 1.025 1.005 - 1.030  ? pH 7.0 5.0 - 8.0  ? Glucose, UA NEGATIVE NEGATIVE mg/dL  ? Hgb urine dipstick NEGATIVE NEGATIVE  ? Bilirubin Urine NEGATIVE NEGATIVE  ? Ketones, ur NEGATIVE NEGATIVE mg/dL  ? Protein, ur NEGATIVE NEGATIVE mg/dL  ? Nitrite NEGATIVE NEGATIVE  ? Leukocytes,Ua TRACE (A) NEGATIVE  ? RBC / HPF 0-5 0 - 5 RBC/hpf  ? WBC, UA 6-10 0 - 5 WBC/hpf  ? Bacteria, UA RARE (A) NONE SEEN  ? Squamous Epithelial / LPF 6-10 0 - 5  ? Mucus PRESENT   ? Triple Phosphate Crystal PRESENT   ?CBC     Status: Abnormal  ? Collection Time: 04/21/22  4:08 AM  ?Result Value  Ref Range  ? WBC 9.4 4.0 - 10.5 K/uL  ? RBC 4.31 3.87 - 5.11 MIL/uL  ? Hemoglobin 11.5 (L) 12.0 - 15.0 g/dL  ? HCT 35.4 (L) 36.0 - 46.0 %  ?  MCV 82.1 80.0 - 100.0 fL  ? MCH 26.7 26.0 - 34.0 pg  ? MCHC 32.5 30.0 - 36.0 g/dL  ? RDW 15.2 11.5 - 15.5 %  ? Platelets 229 150 - 400 K/uL  ? nRBC 0.0 0.0 - 0.2 %  ?Comprehensive metabolic panel     Status: Abnormal  ? Collection Time: 04/21/22  4:08 AM  ?Result Value Ref Range  ? Sodium 136 135 - 145 mmol/L  ? Potassium 3.8 3.5 - 5.1 mmol/L  ? Chloride 106 98 - 111 mmol/L  ? CO2 23 22 - 32 mmol/L  ? Glucose, Bld 103 (H) 70 - 99 mg/dL  ? BUN 8 6 - 20 mg/dL  ? Creatinine, Ser 0.53 0.44 - 1.00 mg/dL  ? Calcium 9.2 8.9 - 10.3 mg/dL  ? Total Protein 7.0 6.5 - 8.1 g/dL  ? Albumin 4.0 3.5 - 5.0 g/dL  ? AST 21 15 - 41 U/L  ? ALT 13 0 - 44 U/L  ? Alkaline Phosphatase 73 38 - 126 U/L  ? Total Bilirubin 0.6 0.3 - 1.2 mg/dL  ? GFR, Estimated >60 >60 mL/min  ? Anion gap 7 5 - 15  ?hCG, quantitative, pregnancy     Status: Abnormal  ? Collection Time: 04/21/22  4:08 AM  ?Result Value Ref Range  ? hCG, Beta Chain, Quant, S 65,222 (H) <5 mIU/mL  ?Wet prep, genital     Status: Abnormal  ? Collection Time: 04/21/22  5:25 AM  ? Specimen: Vaginal  ?Result Value Ref Range  ? Yeast Wet Prep HPF POC NONE SEEN NONE SEEN  ? Trich, Wet Prep NONE SEEN NONE SEEN  ? Clue Cells Wet Prep HPF POC PRESENT (A) NONE SEEN  ? WBC, Wet Prep HPF POC >=10 (A) <10  ? Sperm NONE SEEN   ? ?US OB LESS THAN 14 WEEKS WITH OB TRANSVAGINAL ? ?Result Date: 04/21/2022 ?CLINICAL DATA:  Sharp abdominal pain.  Positive pregnancy test. EXAM: OBSTETRIC <14 WK [place] AND TRANSVAGINAL OB US TECHNIQUE: Both transabdominal and transvaginal ultrasound examinations were performed for complete evaluation of the gestation as well as the maternal uterus, adnexal regions, and pelvic cul-de-sac. Transvaginal technique was performed to assess early pregnancy. COMPARISON:  04/19/2022.  03/29/2022. FINDINGS: Intrauterine gestational sac: Single.  Yolk sac:  Visualized Embryo:  Visualized Cardiac Activity: Visualized Heart Rate: 154 bpm CRL:  16.6 mm   8 w   0 d                  [place] EDC: 12/01/2022 Subchorionic hemorrhage:  None visualized. Maternal uterus/a

## 2022-04-21 NOTE — Progress Notes (Signed)
Written and verbal d/c instructions given and understanding voiced. 

## 2022-04-23 ENCOUNTER — Telehealth: Payer: Self-pay | Admitting: Obstetrics & Gynecology

## 2022-04-23 DIAGNOSIS — O219 Vomiting of pregnancy, unspecified: Secondary | ICD-10-CM

## 2022-04-23 MED ORDER — ONDANSETRON 4 MG PO TBDP: 4.0000 mg | ORAL_TABLET | Freq: Three times a day (TID) | ORAL | 3 refills | Status: DC | PRN

## 2022-04-23 NOTE — Telephone Encounter (Signed)
Pt seen in MAU and still noting issues with nausea/vomiting and lower pelvic pain.  States she is not able to keep anything down.  Feels like she would be doing better if she could just eat something.  Diclegis not working.  Having regular BMs. ? ?Plan for trial of zofran ?Due to prior gastric bypass, will closely monitor pain.  Should she note worsening of her pain- return to MAU ? ?Janyth Pupa, DO ?Attending [place] ?[facility] ? ? ?

## 2022-04-23 NOTE — Telephone Encounter (Signed)
Patient calling stating that she went to ER over the weekend due to abdominal pain and not able to keep anything thing. Patient is not able to eat and tums are not helping. Patient is asking for someone to call her. ?

## 2022-04-24 LAB — GC/CHLAMYDIA PROBE AMP (~~LOC~~) NOT AT ARMC
Chlamydia: NEGATIVE
Comment: NEGATIVE
Comment: NORMAL
Neisseria Gonorrhea: NEGATIVE

## 2022-05-23 ENCOUNTER — Encounter: Payer: Self-pay | Admitting: Advanced Practice Midwife

## 2022-05-23 ENCOUNTER — Other Ambulatory Visit: Payer: Self-pay | Admitting: Obstetrics & Gynecology

## 2022-05-23 DIAGNOSIS — Z3490 Encounter for supervision of normal pregnancy, unspecified, unspecified trimester: Secondary | ICD-10-CM | POA: Insufficient documentation

## 2022-05-23 DIAGNOSIS — O0993 Supervision of high risk pregnancy, unspecified, third trimester: Secondary | ICD-10-CM | POA: Insufficient documentation

## 2022-05-23 DIAGNOSIS — Z3682 Encounter for antenatal screening for nuchal translucency: Secondary | ICD-10-CM

## 2022-05-24 ENCOUNTER — Ambulatory Visit: Payer: Medicaid Other | Admitting: *Deleted

## 2022-05-24 ENCOUNTER — Encounter: Payer: Self-pay | Admitting: Advanced Practice Midwife

## 2022-05-24 ENCOUNTER — Ambulatory Visit (INDEPENDENT_AMBULATORY_CARE_PROVIDER_SITE_OTHER): Payer: Medicaid Other

## 2022-05-24 ENCOUNTER — Other Ambulatory Visit: Payer: Self-pay | Admitting: *Deleted

## 2022-05-24 ENCOUNTER — Ambulatory Visit (INDEPENDENT_AMBULATORY_CARE_PROVIDER_SITE_OTHER): Payer: Medicaid Other | Admitting: Advanced Practice Midwife

## 2022-05-24 VITALS — BP 96/60 | HR 64 | Wt 153.0 lb

## 2022-05-24 DIAGNOSIS — Z3A12 12 weeks gestation of pregnancy: Secondary | ICD-10-CM

## 2022-05-24 DIAGNOSIS — F419 Anxiety disorder, unspecified: Secondary | ICD-10-CM

## 2022-05-24 DIAGNOSIS — Z3480 Encounter for supervision of other normal pregnancy, unspecified trimester: Secondary | ICD-10-CM

## 2022-05-24 DIAGNOSIS — Z3682 Encounter for antenatal screening for nuchal translucency: Secondary | ICD-10-CM

## 2022-05-24 DIAGNOSIS — E282 Polycystic ovarian syndrome: Secondary | ICD-10-CM

## 2022-05-24 DIAGNOSIS — G90A Postural orthostatic tachycardia syndrome (POTS): Secondary | ICD-10-CM

## 2022-05-24 LAB — POCT URINALYSIS DIPSTICK OB
Blood, UA: NEGATIVE
Glucose, UA: NEGATIVE
Ketones, UA: NEGATIVE
Leukocytes, UA: NEGATIVE
Nitrite, UA: NEGATIVE
POC,PROTEIN,UA: NEGATIVE

## 2022-05-24 NOTE — Patient Instructions (Addendum)
[name], I greatly value your feedback.  If you receive a survey following your visit with [place] today, we appreciate you taking the time to fill it out.  Thanks, Nigel Berthold, DNP, CNM  [place]!!! It is now [place] at [facility] ([address] [place],  [address]) Entrance located off of [address] parking      [phone]. Call to schedule behavioral health appointment (let them know if you want to do it virtually, ask for appt w/Jaime).      Nausea & Vomiting Have saltine crackers or pretzels by your bed and eat a few bites before you raise your head out of bed in the morning Eat small frequent meals throughout the day instead of large meals Drink plenty of fluids throughout the day to stay hydrated, just don't drink a lot of fluids with your meals.  This can make your stomach fill up faster making you feel sick Do not brush your teeth right after you eat Products with real ginger are good for nausea, like ginger ale and ginger hard candy Make sure it says made with real ginger! Sucking on sour candy like lemon heads is also good for nausea If your prenatal vitamins make you nauseated, take them at night so you will sleep through the nausea Sea Bands If you feel like you need medicine for the nausea & vomiting please let us know If you are unable to keep any fluids or food down please let us know   Constipation Drink plenty of fluid, preferably water, throughout the day Eat foods high in fiber such as fruits, vegetables, and grains Exercise, such as walking, is a good way to keep your bowels regular Drink warm fluids, especially warm prune juice, or decaf coffee Eat a 1/2 cup of real oatmeal (not instant), 1/2 cup applesauce, and 1/2-1 cup warm prune juice every day If needed, you may take Colace (docusate sodium) stool softener once or twice a day to help keep the stool soft.  If you still are having  problems with constipation, you may take Miralax once daily as needed to help keep your bowels regular.   Home Blood Pressure Monitoring for Patients   Your provider has recommended that you check your blood pressure (BP) at least once a week at home. If you do not have a blood pressure cuff at home, one will be provided for you. Contact your provider if you have not received your monitor within 1 week.   Helpful Tips for Accurate Home Blood Pressure Checks  Don't smoke, exercise, or drink caffeine 30 minutes before checking your BP Use the restroom before checking your BP (a full bladder can raise your pressure) Relax in a comfortable upright chair Feet on the ground Left arm resting comfortably on a flat surface at the level of your heart Legs uncrossed Back supported Sit quietly and don't talk Place the cuff on your bare arm Adjust snuggly, so that only two fingertips can fit between your skin and the top of the cuff Check 2 readings separated by at least one minute Keep a log of your BP readings For a visual, please reference this diagram: http://ccnc.care/bpdiagram  Provider Name: [facility]     Phone: [phone]  Zone 1: ALL CLEAR  Continue to monitor your symptoms:  BP reading is less than 140 (top number) or less than 90 (bottom number)  No right upper stomach pain No headaches or  seeing spots No feeling nauseated or throwing up No swelling in face and hands  Zone 2: CAUTION Call your doctor's office for any of the following:  BP reading is greater than 140 (top number) or greater than 90 (bottom number)  Stomach pain under your ribs in the middle or right side Headaches or seeing spots Feeling nauseated or throwing up Swelling in face and hands  Zone 3: EMERGENCY  Seek immediate medical care if you have any of the following:  BP reading is greater than160 (top number) or greater than 110 (bottom number) Severe headaches not improving with Tylenol Serious  difficulty catching your breath Any worsening symptoms from Zone 2    First Trimester of Pregnancy The first trimester of pregnancy is from week 1 until the end of week 12 (months 1 through 3). A week after a sperm fertilizes an egg, the egg will implant on the wall of the uterus. This embryo will begin to develop into a baby. Genes from you and your partner are forming the baby. The female genes determine whether the baby is a boy or a girl. At 6-8 weeks, the eyes and face are formed, and the heartbeat can be seen on ultrasound. At the end of 12 weeks, all the baby's organs are formed.  Now that you are pregnant, you will want to do everything you can to have a healthy baby. Two of the most important things are to get good prenatal care and to follow your health care provider's instructions. Prenatal care is all the medical care you receive before the baby's birth. This care will help prevent, find, and treat any problems during the pregnancy and childbirth. BODY CHANGES Your body goes through many changes during pregnancy. The changes vary from woman to woman.  You may gain or lose a couple of pounds at first. You may feel sick to your stomach (nauseous) and throw up (vomit). If the vomiting is uncontrollable, call your health care provider. You may tire easily. You may develop headaches that can be relieved by medicines approved by your health care provider. You may urinate more often. Painful urination may mean you have a bladder infection. You may develop heartburn as a result of your pregnancy. You may develop constipation because certain hormones are causing the muscles that push waste through your intestines to slow down. You may develop hemorrhoids or swollen, bulging veins (varicose veins). Your breasts may begin to grow larger and become tender. Your nipples may stick out more, and the tissue that surrounds them (areola) may become darker. Your gums may bleed and may be sensitive to  brushing and flossing. Dark spots or blotches (chloasma, mask of pregnancy) may develop on your face. This will likely fade after the baby is born. Your menstrual periods will stop. You may have a loss of appetite. You may develop cravings for certain kinds of food. You may have changes in your emotions from day to day, such as being excited to be pregnant or being concerned that something may go wrong with the pregnancy and baby. You may have more vivid and strange dreams. You may have changes in your hair. These can include thickening of your hair, rapid growth, and changes in texture. Some women also have hair loss during or after pregnancy, or hair that feels dry or thin. Your hair will most likely return to normal after your baby is born. WHAT TO EXPECT AT YOUR PRENATAL VISITS During a routine prenatal visit: You will be weighed  to make sure you and the baby are growing normally. Your blood pressure will be taken. Your abdomen will be measured to track your baby's growth. The fetal heartbeat will be listened to starting around week 10 or 12 of your pregnancy. Test results from any previous visits will be discussed. Your health care provider may ask you: How you are feeling. If you are feeling the baby move. If you have had any abnormal symptoms, such as leaking fluid, bleeding, severe headaches, or abdominal cramping. If you have any questions. Other tests that may be performed during your first trimester include: Blood tests to find your blood type and to check for the presence of any previous infections. They will also be used to check for low iron levels (anemia) and Rh antibodies. Later in the pregnancy, blood tests for diabetes will be done along with other tests if problems develop. Urine tests to check for infections, diabetes, or protein in the urine. An ultrasound to confirm the proper growth and development of the baby. An amniocentesis to check for possible genetic  problems. Fetal screens for spina bifida and Down syndrome. You may need other tests to make sure you and the baby are doing well. HOME CARE INSTRUCTIONS  Medicines Follow your health care provider's instructions regarding medicine use. Specific medicines may be either safe or unsafe to take during pregnancy. Take your prenatal vitamins as directed. If you develop constipation, try taking a stool softener if your health care provider approves. Diet Eat regular, well-balanced meals. Choose a variety of foods, such as meat or vegetable-based protein, fish, milk and low-fat dairy products, vegetables, fruits, and whole grain breads and cereals. Your health care provider will help you determine the amount of weight gain that is right for you. Avoid raw meat and uncooked cheese. These carry germs that can cause birth defects in the baby. Eating four or five small meals rather than three large meals a day may help relieve nausea and vomiting. If you start to feel nauseous, eating a few soda crackers can be helpful. Drinking liquids between meals instead of during meals also seems to help nausea and vomiting. If you develop constipation, eat more high-fiber foods, such as fresh vegetables or fruit and whole grains. Drink enough fluids to keep your urine clear or pale yellow. Activity and Exercise Exercise only as directed by your health care provider. Exercising will help you: Control your weight. Stay in shape. Be prepared for labor and delivery. Experiencing pain or cramping in the lower abdomen or low back is a good sign that you should stop exercising. Check with your health care provider before continuing normal exercises. Try to avoid standing for long periods of time. Move your legs often if you must stand in one place for a long time. Avoid heavy lifting. Wear low-heeled shoes, and practice good posture. You may continue to have sex unless your health care provider directs you  otherwise. Relief of Pain or Discomfort Wear a good support bra for breast tenderness.   Take warm sitz baths to soothe any pain or discomfort caused by hemorrhoids. Use hemorrhoid cream if your health care provider approves.   Rest with your legs elevated if you have leg cramps or low back pain. If you develop varicose veins in your legs, wear support hose. Elevate your feet for 15 minutes, 3-4 times a day. Limit salt in your diet. Prenatal Care Schedule your prenatal visits by the twelfth week of pregnancy. They are usually scheduled monthly at  first, then more often in the last 2 months before delivery. Write down your questions. Take them to your prenatal visits. Keep all your prenatal visits as directed by your health care provider. Safety Wear your seat belt at all times when driving. Make a list of emergency phone numbers, including numbers for family, friends, the hospital, and police and fire departments. General Tips Ask your health care provider for a referral to a local prenatal education class. Begin classes no later than at the beginning of month 6 of your pregnancy. Ask for help if you have counseling or nutritional needs during pregnancy. Your health care provider can offer advice or refer you to specialists for help with various needs. Do not use hot tubs, steam rooms, or saunas. Do not douche or use tampons or scented sanitary pads. Do not cross your legs for long periods of time. Avoid cat litter boxes and soil used by cats. These carry germs that can cause birth defects in the baby and possibly loss of the fetus by miscarriage or stillbirth. Avoid all smoking, herbs, alcohol, and medicines not prescribed by your health care provider. Chemicals in these affect the formation and growth of the baby. Schedule a dentist appointment. At home, brush your teeth with a soft toothbrush and be gentle when you floss. SEEK MEDICAL CARE IF:  You have dizziness. You have mild pelvic  cramps, pelvic pressure, or nagging pain in the abdominal area. You have persistent nausea, vomiting, or diarrhea. You have a bad smelling vaginal discharge. You have pain with urination. You notice increased swelling in your face, hands, legs, or ankles. SEEK IMMEDIATE MEDICAL CARE IF:  You have a fever. You are leaking fluid from your vagina. You have spotting or bleeding from your vagina. You have severe abdominal cramping or pain. You have rapid weight gain or loss. You vomit blood or material that looks like coffee grounds. You are exposed to [place] measles and have never had them. You are exposed to fifth disease or chickenpox. You develop a severe headache. You have shortness of breath. You have any kind of trauma, such as from a fall or a car accident. Document Released: 12/04/2001 Document Revised: 04/26/2014 Document Reviewed: 10/20/2013 [facility] Patient Information 2015 [place], [place]. This information is not intended to replace advice given to you by your health care provider. Make sure you discuss any questions you have with your health care provider.  Coronavirus (COVID-19) Are you at risk?  Are you at risk for the Coronavirus (COVID-19)?  To be considered HIGH RISK for Coronavirus (COVID-19), you have to meet the following criteria:  Traveled to [place], [place], [place], [place] or [place];  and have fever, cough, and shortness of breath within the last 2 weeks of travel OR Been in close contact with a person diagnosed with COVID-19 within the last 2 weeks and have fever, cough, and shortness of breath IF YOU DO NOT MEET THESE CRITERIA, YOU ARE CONSIDERED LOW RISK FOR COVID-19.  What to do if you are HIGH RISK for COVID-19?  If you are having a medical emergency, call 911. Seek medical care right away. Before you go to a doctor's office, urgent care or emergency department, call ahead and tell them about your recent travel, contact with someone diagnosed with COVID-19,  and your symptoms. You should receive instructions from your physician's office regarding next steps of care.  When you arrive at healthcare provider, tell the healthcare staff immediately you have returned from visiting [place], [place], [place],  [place] or [place]; in the last two weeks or you have been in close contact with a person diagnosed with COVID-19 in the last 2 weeks.   Tell the health care staff about your symptoms: fever, cough and shortness of breath. After you have been seen by a medical provider, you will be either: Tested for (COVID-19) and discharged home on quarantine except to seek medical care if symptoms worsen, and asked to  Stay home and avoid contact with others until you get your results (4-5 days)  Avoid travel on public transportation if possible (such as bus, train, or airplane) or Sent to the Emergency Department by EMS for evaluation, COVID-19 testing, and possible admission depending on your condition and test results.  What to do if you are LOW RISK for COVID-19?  Reduce your risk of any infection by using the same precautions used for avoiding the common cold or flu:  Wash your hands often with soap and warm water for at least 20 seconds.  If soap and water are not readily available, use an alcohol-based hand sanitizer with at least 60% alcohol.  If coughing or sneezing, cover your mouth and nose by coughing or sneezing into the elbow areas of your shirt or coat, into a tissue or into your sleeve (not your hands). Avoid shaking hands with others and consider head nods or verbal greetings only. Avoid touching your eyes, nose, or mouth with unwashed hands.  Avoid close contact with people who are sick. Avoid places or events with large numbers of people in one location, like concerts or sporting events. Carefully consider travel plans you have or are making. If you are planning any travel outside or inside the [place], visit the CDC's Travelers' Health webpage for the latest  health notices. If you have some symptoms but not all symptoms, continue to monitor at home and seek medical attention if your symptoms worsen. If you are having a medical emergency, call 911.   ADDITIONAL HEALTHCARE OPTIONS FOR [place] / e-Visit: [email or web address]         [facility]: [facility]: [phone]                   [facility]: [phone]     Safe Medications in Pregnancy   Acne: Benzoyl Peroxide Salicylic Acid  Backache/Headache: Tylenol: 2 regular strength every 4 hours OR              2 Extra strength every 6 hours  Colds/Coughs/Allergies: Benadryl (alcohol free) 25 mg every 6 hours as needed Breath right strips Claritin Cepacol throat lozenges Chloraseptic throat spray Cold-Eeze- up to three times per day Cough drops, alcohol free Flonase (by prescription only) Guaifenesin Mucinex Robitussin DM (plain only, alcohol free) Saline nasal spray/drops Sudafed (pseudoephedrine) & Actifed ** use only after [redacted] weeks gestation and if you do not have high blood pressure Tylenol Vicks Vaporub Zinc lozenges Zyrtec   Constipation: Colace Ducolax suppositories Fleet enema Glycerin suppositories Metamucil Milk of magnesia Miralax Senokot Smooth move tea  Diarrhea: Kaopectate Imodium A-D  *NO pepto Bismol  Hemorrhoids: Anusol Anusol HC Preparation H Tucks  Indigestion: Tums Maalox Mylanta Zantac  Pepcid  Insomnia: Benadryl (alcohol free) '25mg'$  every 6 hours as needed Tylenol PM Unisom, no Gelcaps  Leg Cramps: Tums MagGel  Nausea/Vomiting:  Bonine Dramamine Emetrol Ginger extract Sea bands Meclizine  Nausea medication to take during pregnancy:  Unisom (doxylamine succinate 25 mg tablets)  Take one tablet daily at bedtime. If symptoms are not adequately controlled, the dose can be increased to a maximum  recommended dose of two tablets daily (1/2 tablet in the morning, 1/2 tablet mid-afternoon and one at bedtime). Vitamin B6 '100mg'$  tablets. Take one tablet twice a day (up to 200 mg per day).  Skin Rashes: Aveeno products Benadryl cream or '25mg'$  every 6 hours as needed Calamine Lotion 1% cortisone cream  Yeast infection: Gyne-lotrimin 7 Monistat 7   **If taking multiple medications, please check labels to avoid duplicating the same active ingredients **take medication as directed on the label ** Do not exceed 4000 mg of tylenol in 24 hours **Do not take medications that contain aspirin or ibuprofen

## 2022-05-24 NOTE — Progress Notes (Signed)
[place] 12+4 wks,measurements c/w dates,CRL 66.3 mm,NB present,NT 1.7 mm,normal ovaries,FHR 145 bpm,anterior placenta gr 0

## 2022-05-24 NOTE — Progress Notes (Signed)
INITIAL OBSTETRICAL VISIT Patient name: Vickie Sutton MRN [id number]  Date of birth: [birth year] Chief Complaint:   Initial Prenatal Visit  History of Present Illness:   Vickie Sutton is a 21 y.o. G45P1001  female at 23w4dby [place] 7 weeks with an Estimated Date of Delivery: 12/02/22 being seen today for her initial obstetrical visit.   Her obstetrical history is significant for Term SVD last year w/o problems.   Today she reports having dizzy, near syncope spells since stopped beta blocker (advised to stop d/t IUGR risk). Upped lexapro (w/PCP) to '20mg'$ --offered therapy referral, accepted.  .     05/24/2022    9:30 AM 06/16/2021    9:44 AM 08/08/2020   10:33 AM  Depression screen PHQ 2/9  Decreased Interest 0 1 0  Down, Depressed, Hopeless 0 1 0  PHQ - 2 Score 0 2 0  Altered sleeping 0 0 0  Tired, decreased energy '2 2 2  '$ Change in appetite 0 0 0  Feeling bad or failure about yourself  0 0 0  Trouble concentrating 0 0 0  Moving slowly or fidgety/restless 0 0 0  Suicidal thoughts 0 0 0  PHQ-9 Score '2 4 2    '$ Patient's last menstrual period was 02/19/2022 (exact date). Last pap 2022. Results were: normal Review of Systems:   Pertinent items are noted in HPI Denies cramping/contractions, leakage of fluid, vaginal bleeding, abnormal vaginal discharge w/ itching/odor/irritation, headaches, visual changes, shortness of breath, chest pain, abdominal pain, severe nausea/vomiting, or problems with urination or bowel movements unless otherwise stated above.  Pertinent History Reviewed:  Reviewed past medical,surgical, social, obstetrical and family history.  Reviewed problem list, medications and allergies. OB History  Gravida Para Term Preterm AB Living  '2 1 1     1  '$ SAB IAB Ectopic Multiple Live Births        0 1    # Outcome Date GA Lbr Len/2nd Weight Sex Delivery Anes PTL Lv  2 Current           1 Term 02/10/21 323w3d0:40 / 04:23 6 lb 10 oz (3.005 kg) M Vag-Spont EPI N LIV   Physical  Assessment:   Vitals:   05/24/22 0926  BP: 96/60  Pulse: 64  Weight: 153 lb (69.4 kg)  Body mass index is 25.46 kg/m.       Physical Examination:  General appearance - well appearing, and in no distress  Mental status - alert, oriented to person, place, and time  Psych:  She has a normal mood and affect  Skin - warm and dry, normal color, no suspicious lesions noted  Chest - effort normal  Heart - normal rate and regular rhythm  Abdomen - soft, nontender  Extremities:  No swelling or varicosities noted   TODAY'S NT [place]2+4 wks,measurements c/w dates,CRL 66.3 mm,NB present,NT 1.7 mm,normal ovaries,FHR 145 bpm,anterior placenta gr 0  Results for orders placed or performed in visit on 05/24/22 (from the past 24 hour(s))  POC Urinalysis Dipstick OB   Collection Time: 05/24/22 10:44 AM  Result Value Ref Range   Color, UA     Clarity, UA     Glucose, UA Negative Negative   Bilirubin, UA     Ketones, UA neg    Spec Grav, UA     Blood, UA neg    pH, UA     POC,PROTEIN,UA Negative Negative, Trace, Small (1+), Moderate (2+), Large (3+), 4+   Urobilinogen, UA  Nitrite, UA neg    Leukocytes, UA Negative Negative   Appearance     Odor      Assessment & Plan:  1) Low-Risk Pregnancy G2P1001 at 45w4dwith an Estimated Date of Delivery: 12/02/22   2) Initial OB visit  3) POTS:  recently taken off Bblocker, symptomatic.  Refer to OB/Cards 4) Anxiety/Depression:  on lexapro, refer to [facility] No orders of the defined types were placed in this encounter.   Initial labs obtained Continue prenatal vitamins Reviewed n/v relief measures and warning s/s to report Reviewed recommended weight gain based on pre-gravid BMI Encouraged well-balanced diet Genetic & carrier screening discussed: requests Panorama, NT/IT, and Horizon , declines AFP Ultrasound discussed; fetal survey: requested CCNC completed> form faxed if has or is planning to apply for medicaid The nature of [facility] MDs and other Advanced Practice Providers was explained to patient; also emphasized that fellows, residents, and students are part of our team. Has home bp cuff. Check bp weekly, let u[place]now if >140/90.        FJoaquim LaiCresenzo-Dishmon 11:07 AM

## 2022-05-26 LAB — CBC/D/PLT+RPR+RH+ABO+RUBIGG...
Antibody Screen: NEGATIVE
Basophils Absolute: 0 10*3/uL (ref 0.0–0.2)
Basos: 0 %
EOS (ABSOLUTE): 0.1 10*3/uL (ref 0.0–0.4)
Eos: 1 %
HCV Ab: NONREACTIVE
HIV Screen 4th Generation wRfx: NONREACTIVE
Hematocrit: 37.1 % (ref 34.0–46.6)
Hemoglobin: 12.2 g/dL (ref 11.1–15.9)
Hepatitis B Surface Ag: NEGATIVE
Immature Grans (Abs): 0 10*3/uL (ref 0.0–0.1)
Immature Granulocytes: 0 %
Lymphocytes Absolute: 1.8 10*3/uL (ref 0.7–3.1)
Lymphs: 24 %
MCH: 27 pg (ref 26.6–33.0)
MCHC: 32.9 g/dL (ref 31.5–35.7)
MCV: 82 fL (ref 79–97)
Monocytes Absolute: 0.5 10*3/uL (ref 0.1–0.9)
Monocytes: 7 %
Neutrophils Absolute: 5.3 10*3/uL (ref 1.4–7.0)
Neutrophils: 68 %
Platelets: 242 10*3/uL (ref 150–450)
RBC: 4.52 x10E6/uL (ref 3.77–5.28)
RDW: 14.7 % (ref 11.7–15.4)
RPR Ser Ql: NONREACTIVE
Rh Factor: POSITIVE
Rubella Antibodies, IGG: 7.79 index (ref >0.99)
WBC: 7.7 10*3/uL (ref 3.4–10.8)

## 2022-05-26 LAB — INTEGRATED 1
Crown Rump Length: 66.3 mm
Gest. Age on Collection Date: 12.7 weeks
Maternal Age at EDD: 23.3 yr
Nuchal Translucency (NT): 1.7 mm
Number of Fetuses: 1
PAPP-A Value: 581.2 ng/mL
Weight: 153 [lb_av]

## 2022-05-26 LAB — HEMOGLOBIN A1C
Est. average glucose Bld gHb Est-mCnc: 103 mg/dL
Hgb A1c MFr Bld: 5.2 % (ref 4.8–5.6)

## 2022-05-26 LAB — HCV INTERPRETATION

## 2022-05-28 LAB — URINE CULTURE

## 2022-05-28 LAB — GC/CHLAMYDIA PROBE AMP
Chlamydia trachomatis, NAA: NEGATIVE
Neisseria Gonorrhoeae by PCR: NEGATIVE

## 2022-05-28 NOTE — Progress Notes (Deleted)
Cardio-Obstetrics Clinic  New Evaluation  Date:  05/28/2022   ID:  Vickie Sutton, DOB Apr 26, 2001, MRN [id number]  PCP:  Vickie Sutton   [facility] Providers Cardiologist:  Vickie Lesches, MD  Electrophysiologist:  None       Referring MD: Vickie Sutton, *   Chief Complaint: Syncope  History of Present Illness:    Vickie Sutton is a 21 y.o. female [G2P1001] who is being seen today for the evaluation of syncope at the request of Vickie Sutton, *.   Patient has been followed by Dr. Domenic Sutton for episodes of syncope thought to be neurocardiogenic in nature. POTs was considered, however, syncope is not a characteristic symptom of POTs.TTE 01/2020 showed LVEF 55%, normal RV, no significant valve disease. Cardiac monitor 07/2021 with sinus brady-sinus tach. No arrhythmias. She was maintained on metoprolol.  Today, ***  Prior CV Studies Reviewed: The following studies were reviewed today: Echocardiogram 02/09/2020:  1. Left ventricular ejection fraction, by estimation, is 55%. The left  ventricle has normal function. The left ventricle has no regional wall  motion abnormalities. Left ventricular diastolic parameters were normal.   2. Right ventricular systolic function is normal. The right ventricular  size is normal. There is normal pulmonary artery systolic pressure. The  estimated right ventricular systolic pressure is 40.9 mmHg.   3. The mitral valve is grossly normal. No evidence of mitral valve  regurgitation.   4. The aortic valve is tricuspid. Aortic valve regurgitation is not  visualized.   5. The inferior vena cava is normal in size with greater than 50%  respiratory variability, suggesting right atrial pressure of 3 mmHg.    Cardiac monitor August 2022: [employer] monitor reviewed.  30 days analyzed.  Predominant rhythm is sinus with heart rate ranging from 54 bpm in sinus bradycardia up to 158 bpm in sinus tachycardia.  There were no  obvious arrhythmias documented, no pauses.  Symptoms reported as rapid heartbeat and dizziness did not necessarily correspond to a particular heart rate.    Past Medical History:  Diagnosis Date   ADD (attention deficit disorder)    Anxiety    Asthma    Depression    Migraine    Panic attacks    Polycystic ovarian syndrome    POTS (postural orthostatic tachycardia syndrome)    Syncope    Neurocardiogenic suspected    Past Surgical History:  Procedure Laterality Date   ESOPHAGOGASTRODUODENOSCOPY     LAPAROTOMY N/A 09/07/2020   Procedure: EXPLORATORY LAPAROTOMY WITH EXTRACTION  OVARIAN CYST;  Surgeon: Vickie Buff, MD;  Location: [place];  Service: Gynecology;  Laterality: N/A;   OVARIAN CYST SURGERY Right    ROUX-EN-Y GASTRIC BYPASS     August 2020 - [facility]   TOOTH EXTRACTION N/A 12/12/2015   Procedure: EXTRACTION MOLARS - teeth one, sixteen, seventeen and thirty-two;  Surgeon: Vickie Sutton, DDS;  Location: [place];  Service: Oral Surgery;  Laterality: N/A;   { Click here to update PMH, PSH, OB Hx then refresh note  :1}   OB History     Gravida  2   Para  1   Term  1   Preterm      AB      Living  1      SAB      IAB      Ectopic      Multiple  0   Live Births  1           {  Click here to update OB Charting then refresh note  :1}    Current Medications: No outpatient medications have been marked as taking for the 06/01/22 encounter (Appointment) with Vickie Bergeron, MD.     Allergies:   Patient has no known allergies.   Social History   Socioeconomic History   Marital status: Single    Spouse name: Not on file   Number of children: 1   Years of education: some college   Highest education level: Not on file  Occupational History   Occupation: Chartered certified accountant at [employer]  Tobacco Use   Smoking status: Never   Smokeless tobacco: Never  Vaping Use   Vaping Use: Former  Substance and Sexual Activity   Alcohol use: No   Drug use: No   Sexual  activity: Yes    Birth control/protection: None  Other Topics Concern   Not on file  Social History Narrative   Right-handed.   No daily caffeine.   Lives at home with boyfriend and son.    Social Determinants of Health   Financial Resource Strain: Low Risk    Difficulty of Paying Living Expenses: Not very hard  Food Insecurity: No Food Insecurity   Worried About Charity fundraiser in the Last Year: Never true   Ran Out of Food in the Last Year: Never true  Transportation Needs: No Transportation Needs   Lack of Transportation (Medical): No   Lack of Transportation (Non-Medical): No  Physical Activity: Insufficiently Active   Days of Exercise per Week: 4 days   Minutes of Exercise per Session: 30 min  Stress: Stress Concern Present   Feeling of Stress : To some extent  Social Connections: Socially Isolated   Frequency of Communication with Friends and Family: More than three times a week   Frequency of Social Gatherings with Friends and Family: More than three times a week   Attends Religious Services: Never   Marine scientist or Organizations: No   Attends Music therapist: Never   Marital Status: Never married  { Click here to update SDOH then refresh :1}    Family History  Problem Relation Age of Onset   Diabetes Mellitus II Maternal Grandmother    Heart disease Maternal Grandmother    Obesity Mother    Yves Dill Parkinson White syndrome Brother    Seizures Maternal Aunt    Pneumonia Son    { Click here to update FH then refresh note    :1}   ROS:   Please see the history of present illness.    *** All other systems reviewed and are negative.   Labs/EKG Reviewed:    EKG:   EKG is *** ordered today.  The ekg ordered today demonstrates ***  Recent Labs: 02/26/2022: TSH 1.610 04/21/2022: ALT 13; BUN 8; Creatinine, Ser 0.53; Potassium 3.8; Sodium 136 05/24/2022: Hemoglobin 12.2; Platelets 242   Recent Lipid Panel No results found for: CHOL,  TRIG, HDL, CHOLHDL, LDLCALC, LDLDIRECT  Physical Exam:    VS:  LMP 02/19/2022 (Exact Date)     Wt Readings from Last 3 Encounters:  05/24/22 153 lb (69.4 kg)  04/21/22 151 lb (68.5 kg)  03/29/22 148 lb (67.1 kg)     GEN: *** Well nourished, well developed in no acute distress HEENT: Normal NECK: No JVD; No carotid bruits LYMPHATICS: No lymphadenopathy CARDIAC: ***RRR, no murmurs, rubs, gallops RESPIRATORY:  Clear to auscultation without rales, wheezing or rhonchi  ABDOMEN: Soft, non-tender, non-distended  MUSCULOSKELETAL:  No edema; No deformity  SKIN: Warm and dry NEUROLOGIC:  Alert and oriented x 3 PSYCHIATRIC:  Normal affect    Risk Assessment/Risk Calculators:   { Click to calculate CARPREG II - THEN refresh note :1}    { Click to caclulate Mod WHO Class of CV Risk - THEN refresh note :1}     { Click for LXBWI2MBTD Score - THEN Refresh Note    :[id number]}      ASSESSMENT & PLAN:    #Syncope: Followed by Dr. Domenic Sutton. Thought to be neurocardiogenic in nature. Cardiac monitor without arrhythmias. Likely exacerbated by the hemodynamic changes in pregnancy. Was previously on metop and midodrine, however, these have since been held due to pregnancy.  There are no Patient Instructions on file for this visit.   Dispo:  No follow-ups on file.   Medication Adjustments/Labs and Tests Ordered: Current medicines are reviewed at length with the patient today.  Concerns regarding medicines are outlined above.  Tests Ordered: No orders of the defined types were placed in this encounter.  Medication Changes: No orders of the defined types were placed in this encounter.

## 2022-05-29 ENCOUNTER — Telehealth: Payer: Self-pay | Admitting: Clinical

## 2022-05-29 NOTE — Telephone Encounter (Signed)
Attempt call regarding referral; unable to leave message as mailbox is full.

## 2022-05-31 ENCOUNTER — Other Ambulatory Visit: Payer: Self-pay | Admitting: Advanced Practice Midwife

## 2022-05-31 MED ORDER — CEFADROXIL 500 MG PO CAPS: 500.0000 mg | ORAL_CAPSULE | Freq: Two times a day (BID) | ORAL | 0 refills | Status: DC

## 2022-05-31 MED ORDER — AMPICILLIN 500 MG PO CAPS: 500.0000 mg | ORAL_CAPSULE | Freq: Three times a day (TID) | ORAL | 0 refills | Status: DC

## 2022-05-31 NOTE — Progress Notes (Signed)
Duricef for ASB

## 2022-05-31 NOTE — Progress Notes (Signed)
Cancelled duricef, rx amp for GBS

## 2022-06-01 ENCOUNTER — Ambulatory Visit: Payer: Medicaid Other | Admitting: Cardiology

## 2022-06-07 ENCOUNTER — Encounter: Payer: Self-pay | Admitting: Adult Health

## 2022-06-07 ENCOUNTER — Ambulatory Visit: Payer: Medicaid Other | Admitting: Adult Health

## 2022-06-07 VITALS — BP 94/60 | HR 83 | Ht 65.0 in | Wt 154.0 lb

## 2022-06-07 DIAGNOSIS — R258 Other abnormal involuntary movements: Secondary | ICD-10-CM | POA: Diagnosis not present

## 2022-06-07 DIAGNOSIS — R55 Syncope and collapse: Secondary | ICD-10-CM

## 2022-06-07 NOTE — Patient Instructions (Addendum)
Your Plan:  No changes today - please keep [place] updated if you have any additional possible seizure activity        Thank you for coming to see [place] at [facility]. I hope we have been able to provide you high quality care today.  You may receive a patient satisfaction survey over the next few weeks. We would appreciate your feedback and comments so that we may continue to improve ourselves and the health of our patients.

## 2022-06-07 NOTE — Progress Notes (Signed)
[facility] [address] [place]. [place] [address] [phone]       OFFICE FOLLOW UP NOTE  [name] Date of Birth:  2001-05-11 Medical Record Number:  [id number]    Primary neurologist: Dr. Krista Sutton Reason for visit: Syncopal events, questionable nocturnal seizures    SUBJECTIVE:   CHIEF COMPLAINT:  Chief Complaint  Patient presents with   Follow-up    RM 2 alone Pt is well, had a syncope episode about two months ago.     HPI:   Update 06/07/2022 JM: Patient returns for follow-up visit after prior initial consult visit with Dr. Krista Sutton 3 months ago.  Shortly after prior visit, she found out she is pregnant and currently at [redacted] weeks gestation.  She was started on lamotrigine at prior visit due to possible nocturnal seizures but this has since been discontinued by her OB.  EEG 3/13 unremarkable.  She does not believe she has had any additional events at night although she now lives with her mother and sleeps alone.  She has not woken up with any signs of possible seizure activity during the night or any postictal type symptoms.  Followed by [facility] cardiology for POTS, was taken off of metoprolol in setting of pregnancy by OB.  Does report syncopal events approximately 2 months ago.  She is scheduled to see cardiologist Dr. Johney Sutton on 6/23 who works with pregnant woman with cardiac conditions.  Completed lab work which showed ferritin level at 6 and vitamin D deficiency, recommended supplementation and to follow-up with PCP for further monitoring.  Other labs including folate, RPR, TSH and B12 WNL.   MRI brain 03/24/2022 normal limited MRI as patient unable to complete (reports she experience severe anxiety due to claustrophobia).   No further concerns at this time      History provided for reference purposes only Consult visit 02/26/2022 Dr. Krista Sutton: Vickie Sutton is a 21 year old female, seen in request by her primary care PA Vickie Sutton for  evaluation of passing out episode, she is accompanied by her boyfriend at today's visit February 26, 2022   I reviewed and summarized the referring note. PMHX. Syncope Axniety Bariatric surgery in 2019, lost her weight from 320 to149.   She had a history of morbidly obesity, bariatric surgery in 2019 weight loss from 320 to 149, laboratory evaluation in January 2023 showed mild anemia hemoglobin of 11.9  She had few years history of frequent passing out spells, presented with sudden onset tachycardia, blurry vision, fainting sensation, oftentimes she can brace herself, sliding down to the floor, even while driving, she would have enough warning time to pull over, episodes usually last about 2 to 5 minutes, some severe spells are followed by extreme fatigue afterwards, lasting for couple hours  She was seen by cardiologist, 14 days Holter monitor in February 1-15 reviewed predominant sinus rhythm, intermittent sinus tachycardia, infrequent PACs and PVCs, and 17 beats atrial run, she was seen by [facility] cardiologist, diagnosed with tachycardia induced syncope, improved by beta-blocker treatment  2D echo previously showed no significant abnormality  In addition to the daytime passing out spells that is associated with heart palpitation, sometimes panic feelings, hyperventilation, she also have night time spells since 2023, was witnessed by her boyfriend, 2 similar episode, in her sleep, she had sudden body tensed up, jerking movements last for couple minutes, difficulty Ajovy awakening, no bowel or bladder incontinence  There was also a different episode, she suddenly stopped breathing, holding her breath for  couple of minutes, body tensed up  Her maternal aunt had a history of seizure, her one-year-old nephew from her sister also has seizure her       ROS:   14 system review of systems performed and negative with exception of those listed in HPI  PMH:  Past Medical History:  Diagnosis Date    ADD (attention deficit disorder)    Anxiety    Asthma    Depression    Migraine    Panic attacks    Polycystic ovarian syndrome    POTS (postural orthostatic tachycardia syndrome)    Syncope    Neurocardiogenic suspected    PSH:  Past Surgical History:  Procedure Laterality Date   ESOPHAGOGASTRODUODENOSCOPY     LAPAROTOMY N/A 09/07/2020   Procedure: EXPLORATORY LAPAROTOMY WITH EXTRACTION  OVARIAN CYST;  Surgeon: Vickie Buff, MD;  Location: [place];  Service: Gynecology;  Laterality: N/A;   OVARIAN CYST SURGERY Right    ROUX-EN-Y GASTRIC BYPASS     August 2020 - [facility]   TOOTH EXTRACTION N/A 12/12/2015   Procedure: EXTRACTION MOLARS - teeth one, sixteen, seventeen and thirty-two;  Surgeon: Vickie Sutton, DDS;  Location: [place];  Service: Oral Surgery;  Laterality: N/A;    Social History:  Social History   Socioeconomic History   Marital status: Single    Spouse name: Not on file   Number of children: 1   Years of education: some college   Highest education level: Not on file  Occupational History   Occupation: Chartered certified accountant at [employer]  Tobacco Use   Smoking status: Never   Smokeless tobacco: Never  Vaping Use   Vaping Use: Former  Substance and Sexual Activity   Alcohol use: No   Drug use: No   Sexual activity: Yes    Birth control/protection: None  Other Topics Concern   Not on file  Social History Narrative   Right-handed.   No daily caffeine.   Lives at home with boyfriend and son.    Social Determinants of Health   Financial Resource Strain: Low Risk  (05/24/2022)   Overall Financial Resource Strain (CARDIA)    Difficulty of Paying Living Expenses: Not very hard  Food Insecurity: No Food Insecurity (05/24/2022)   Hunger Vital Sign    Worried About Running Out of Food in the Last Year: Never true    Ran Out of Food in the Last Year: Never true  Transportation Needs: No Transportation Needs (05/24/2022)   PRAPARE - Hydrologist (Medical):  No    Lack of Transportation (Non-Medical): No  Physical Activity: Insufficiently Active (05/24/2022)   Exercise Vital Sign    Days of Exercise per Week: 4 days    Minutes of Exercise per Session: 30 min  Stress: Stress Concern Present (05/24/2022)   [place]    Feeling of Stress : To some extent  Social Connections: Socially Isolated (05/24/2022)   Social Connection and Isolation Panel [NHANES]    Frequency of Communication with Friends and Family: More than three times a week    Frequency of Social Gatherings with Friends and Family: More than three times a week    Attends Religious Services: Never    Marine scientist or Organizations: No    Attends Archivist Meetings: Never    Marital Status: Never married  Intimate Partner Violence: Not At Risk (05/24/2022)   Humiliation, Afraid, Rape, and Kick  questionnaire    Fear of Current or Ex-Partner: No    Emotionally Abused: No    Physically Abused: No    Sexually Abused: No    Family History:  Family History  Problem Relation Age of Onset   Diabetes Mellitus II Maternal Grandmother    Heart disease Maternal Grandmother    Obesity Mother    Yves Dill Parkinson White syndrome Brother    Seizures Maternal Aunt    Pneumonia Son     Medications:   Current Outpatient Medications on File Prior to Visit  Medication Sig Dispense Refill   albuterol (VENTOLIN HFA) 108 (90 Base) MCG/ACT inhaler Inhale 2 puffs into the lungs as needed for wheezing or shortness of breath.     escitalopram (LEXAPRO) 20 MG tablet Take 20 mg by mouth daily.     ondansetron (ZOFRAN-ODT) 4 MG disintegrating tablet Take 1 tablet (4 mg total) by mouth every 8 (eight) hours as needed for nausea or vomiting. (Patient taking differently: Take 4 mg by mouth as needed for nausea or vomiting.) 20 tablet 3   Prenatal Vit-Fe Fumarate-FA (PRENATAL MULTIVITAMIN) TABS tablet Take 1 tablet by mouth daily  at 12 noon.     No current facility-administered medications on file prior to visit.    Allergies:  No Known Allergies    OBJECTIVE:  Physical Exam  Vitals:   06/07/22 1509  BP: 94/60  Pulse: 83  Weight: 154 lb (69.9 kg)  Height: '5\' 5"'$  (1.651 m)   Body mass index is 25.63 kg/m. No results found.   General: well developed, well nourished, pleasant young female, seated, in no evident distress,  Head: head normocephalic and atraumatic.   Neck: supple with no carotid or supraclavicular bruits Cardiovascular: regular rate and rhythm, no murmurs Musculoskeletal: no deformity Skin:  no rash/petichiae Vascular:  Normal pulses all extremities   Neurologic Exam Mental Status: Awake and fully alert. Oriented to place and time. Recent and remote memory intact. Attention span, concentration and fund of knowledge appropriate. Mood and affect appropriate.  Cranial Nerves: Pupils equal, briskly reactive to light. Extraocular movements full without nystagmus. Visual fields full to confrontation. Hearing intact. Facial sensation intact. Face, tongue, palate moves normally and symmetrically.  Motor: Normal bulk and tone. Normal strength in all tested extremity muscles Sensory.: intact to touch , pinprick , position and vibratory sensation.  Coordination: Rapid alternating movements normal in all extremities. Finger-to-nose and heel-to-shin performed accurately bilaterally. Gait and Station: Arises from chair without difficulty. Stance is normal. Gait demonstrates normal stride length and balance without use of AD. Tandem walk and heel toe without difficulty.  Reflexes: 1+ and symmetric. Toes downgoing.         ASSESSMENT/PLAN: Vickie Sutton is a 21 y.o. year old female     Nighttime body jerking movement, difficulty breathing  MR brain 02/2022 normal although limited due to claustrophobia  EEG 02/2022 unremarkable  As she is currently [redacted] weeks pregnant and has not had any additional  witnessed seizure type activity, will hold off on restarting AED. Did discuss risks of seizures during pregnancy and at times benefit may outweigh risk with use of AED's during pregnancy.  She was advised to call if she should experience any type of seizure activity.  She verbalized understanding.             Family history of seizure             POTS  Continue to follow with [facility] cardiology  Scheduled evaluation  with cardiologist Dr. Johney Sutton 6/23 to further assist during pregnancy  History of bariatric surgery with 170 pounds weight loss             Laboratory evaluation including TSH, B12    As she is currently stable and routinely followed by OB and cardiology, recommend following up on an as-needed basis     CC:  GNA: Dr. Krista Sutton PCP: Rosalee Kaufman, PA-C    I spent 24 minutes of face-to-face and non-face-to-face time with patient.  This included previsit chart review, lab review, study review, order entry, electronic health record documentation, patient education regarding above diagnoses with further discussion as above    Frann Rider, [facility]  [facility] [address] [place], [place] [address]  Phone [phone] Fax [phone] Note: This document was prepared with digital dictation and possible smart phrase technology. Any transcriptional errors that result from this process are unintentional.

## 2022-06-09 IMAGING — US US OB < 14 WEEKS - US OB TV
1 series · 14 of 28 positions shown · non-contrast
Comparison: None.

CLINICAL DATA: Vaginal spotting.

EXAM:
OBSTETRIC <14 WK US AND TRANSVAGINAL OB US
TECHNIQUE: Both transabdominal and transvaginal ultrasound examinations were
performed for complete evaluation of the gestation as well as the
maternal uterus, adnexal regions, and pelvic cul-de-sac.
Transvaginal technique was performed to assess early pregnancy.

[Series 1: us ob less than 14 weeks with ob transvaginal · 14 of 63 slices shown]
[im 3/63]
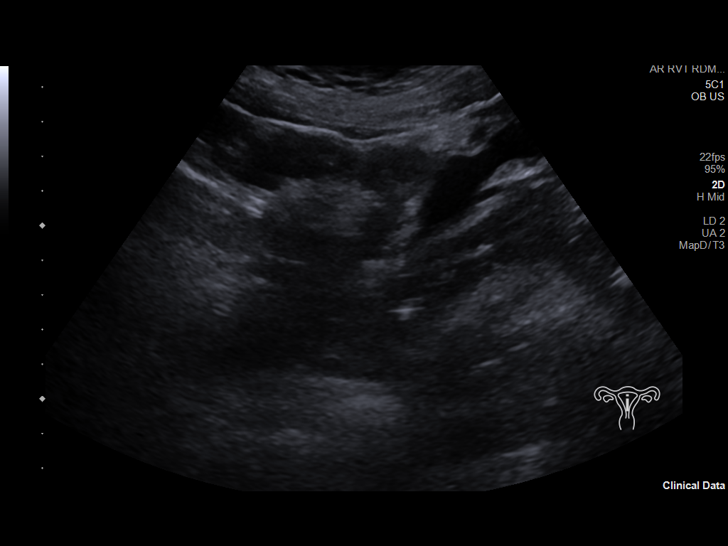
[im 7/63]
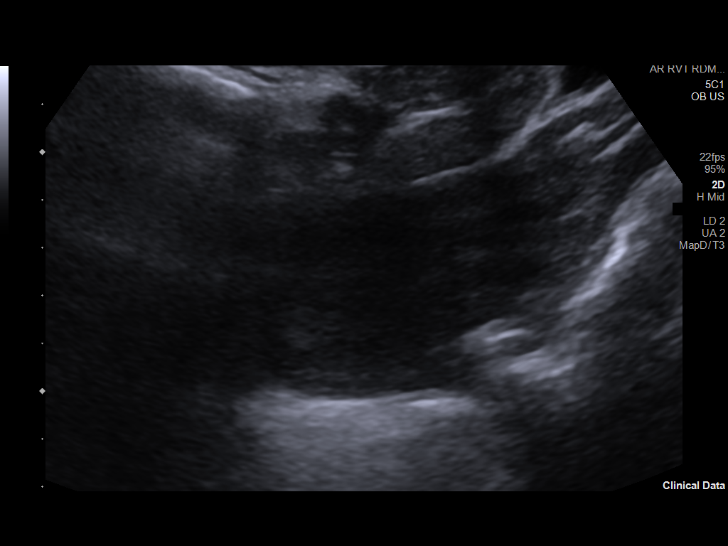
[im 12/63]
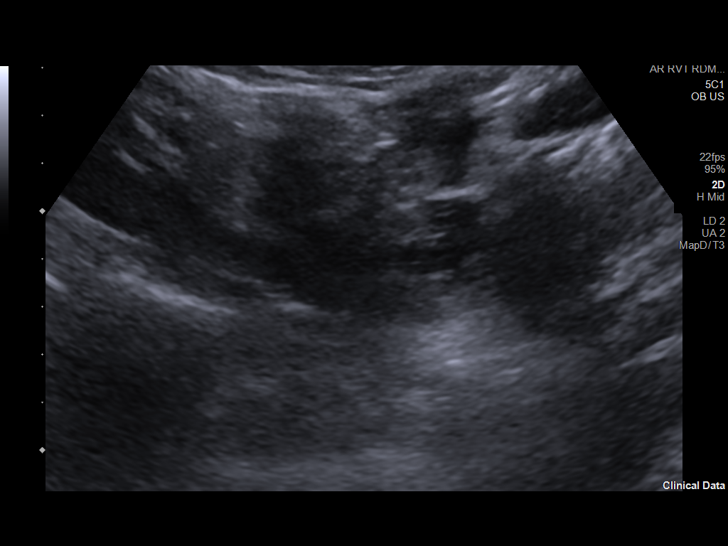
[im 17/63]
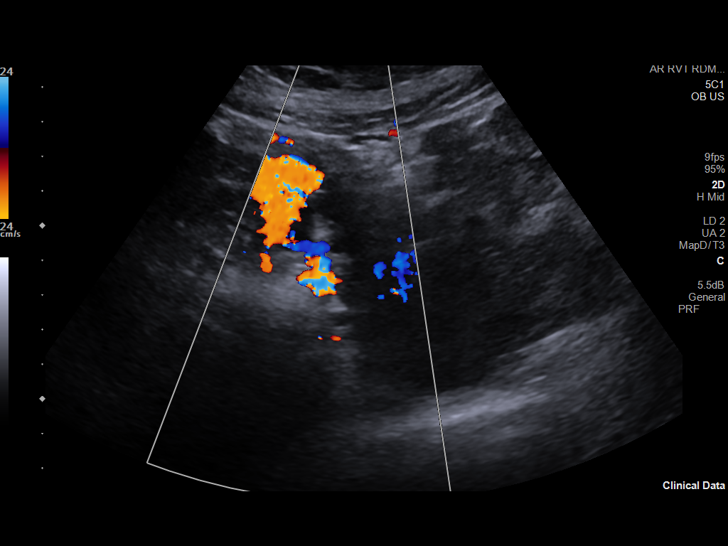
[im 21/63]
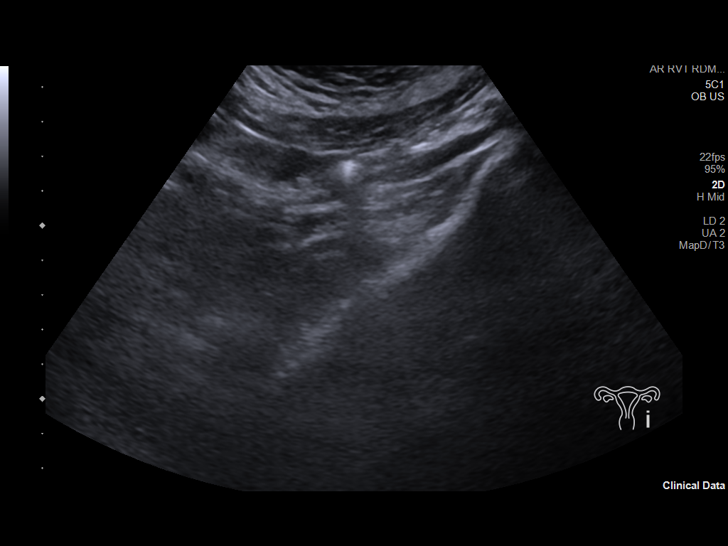
[im 26/63]
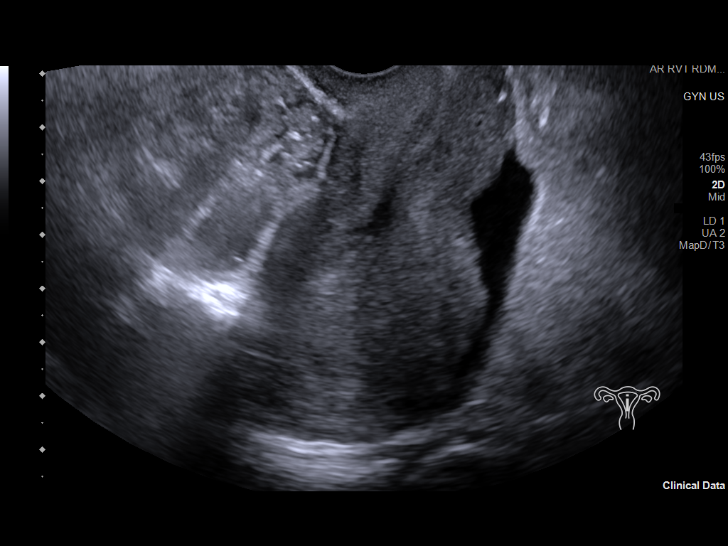
[im 30/63]
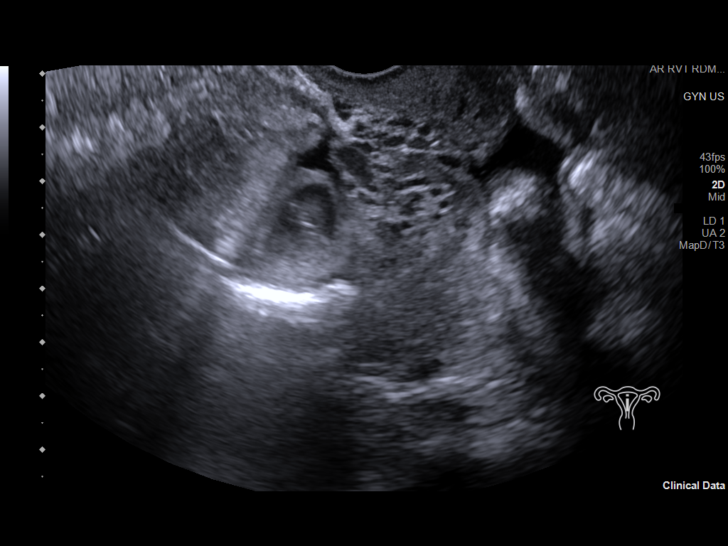
[im 35/63]
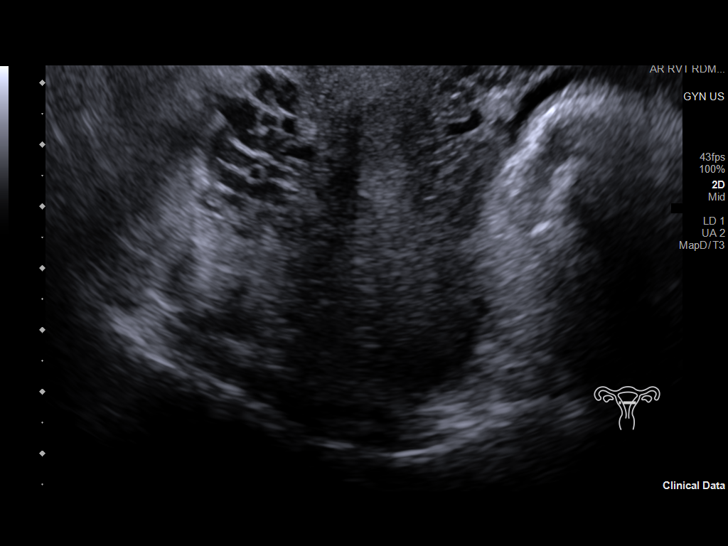
[im 40/63]
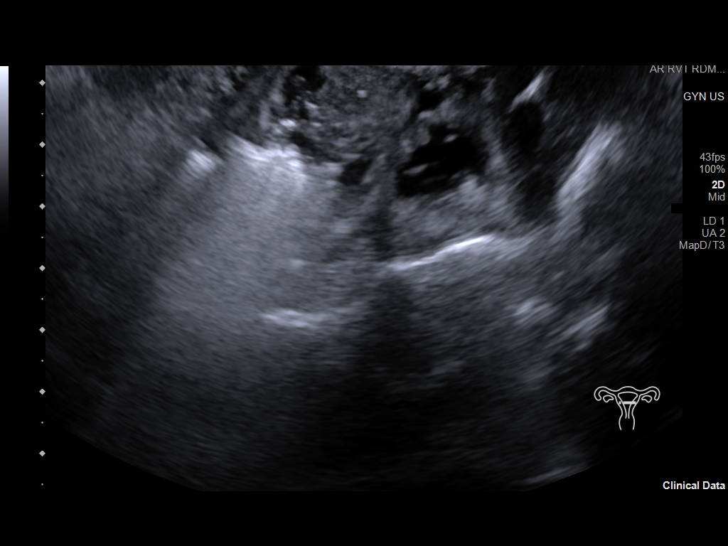
[im 44/63]
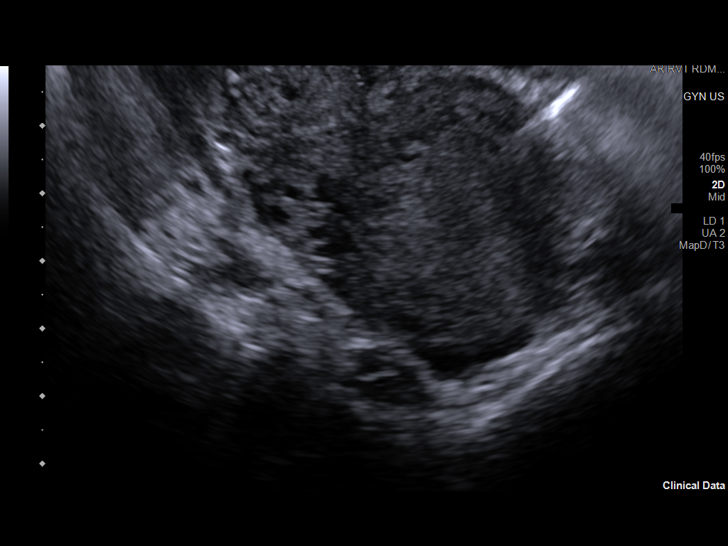
[im 49/63]
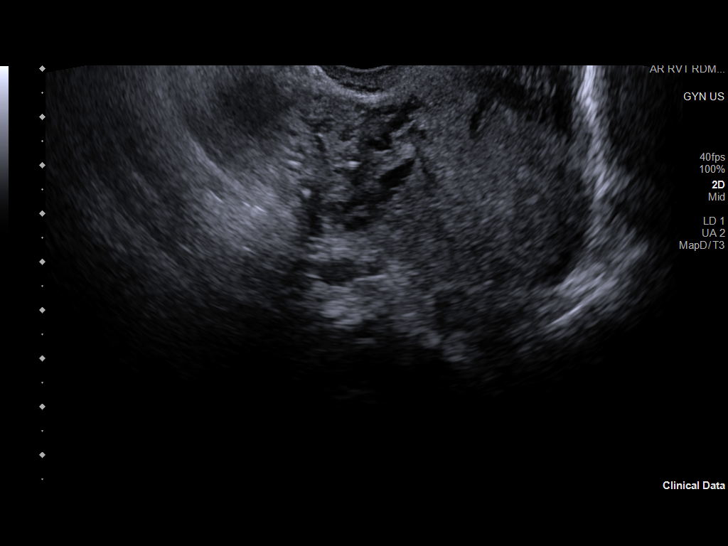
[im 53/63]
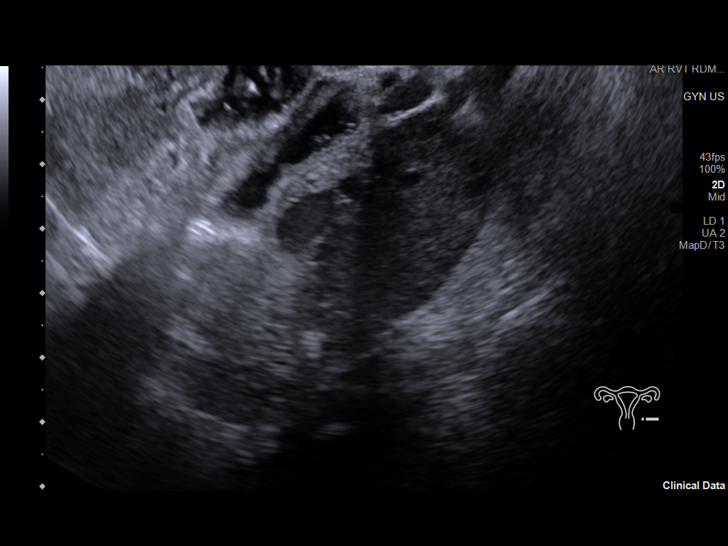
[im 58/63]
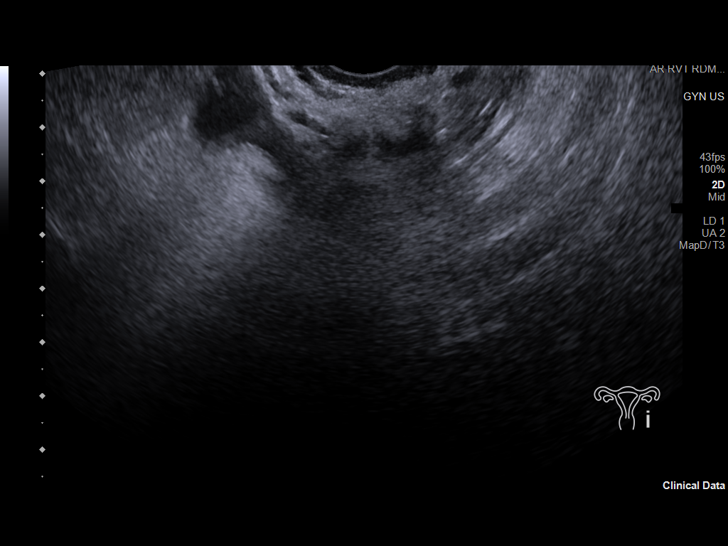
[im 63/63]
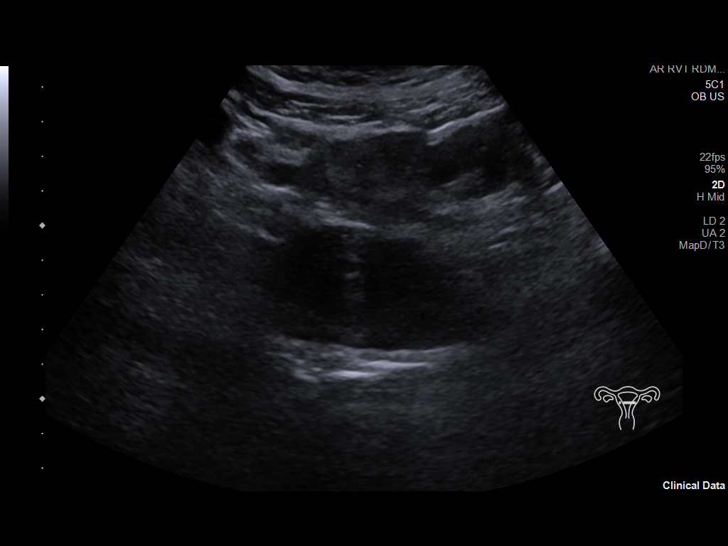

[14 of 28 positions shown; findings below may reference images not displayed]

FINDINGS: Intrauterine gestational sac: None

Maternal uterus/adnexae: Thickened endometrium measuring 2.4 cm.
Unremarkable appearance of the ovaries. No adnexal mass. Small
volume pelvic free fluid.
IMPRESSION: 1. No intrauterine pregnancy or discrete ectopic pregnancy
identified. Findings are consistent with pregnancy of unknown
location and may reflect early intrauterine pregnancy not yet
visualized sonographically, occult ectopic pregnancy, or failed
pregnancy.
2. Thickened endometrium without focal finding.
3. Small volume pelvic free fluid.

## 2022-06-13 ENCOUNTER — Ambulatory Visit (INDEPENDENT_AMBULATORY_CARE_PROVIDER_SITE_OTHER): Payer: Medicaid Other | Admitting: Medical

## 2022-06-13 ENCOUNTER — Encounter: Payer: Self-pay | Admitting: Medical

## 2022-06-13 VITALS — BP 96/53 | HR 68 | Wt 151.0 lb

## 2022-06-13 DIAGNOSIS — Z9884 Bariatric surgery status: Secondary | ICD-10-CM

## 2022-06-13 DIAGNOSIS — Z3A15 15 weeks gestation of pregnancy: Secondary | ICD-10-CM

## 2022-06-13 DIAGNOSIS — E282 Polycystic ovarian syndrome: Secondary | ICD-10-CM

## 2022-06-13 DIAGNOSIS — J4520 Mild intermittent asthma, uncomplicated: Secondary | ICD-10-CM

## 2022-06-13 DIAGNOSIS — Z3480 Encounter for supervision of other normal pregnancy, unspecified trimester: Secondary | ICD-10-CM

## 2022-06-13 DIAGNOSIS — R8271 Bacteriuria: Secondary | ICD-10-CM | POA: Insufficient documentation

## 2022-06-13 DIAGNOSIS — G90A Postural orthostatic tachycardia syndrome (POTS): Secondary | ICD-10-CM

## 2022-06-13 DIAGNOSIS — F419 Anxiety disorder, unspecified: Secondary | ICD-10-CM

## 2022-06-13 NOTE — Progress Notes (Signed)
   PRENATAL VISIT NOTE  Subjective:  Vickie Sutton is a 21 y.o. G2P1001 at 11w3dbeing seen today for ongoing prenatal care.  She is currently monitored for the following issues for this high-risk pregnancy and has POTS (postural orthostatic tachycardia syndrome); Syncope; Asthma; History of Roux-en-Y gastric bypass; PCOS (polycystic ovarian syndrome); Anxiety; Postpartum depression; Jerky body movements; and Encounter for supervision of normal pregnancy, antepartum on their problem list.  Patient reports dizziness, intermittent cramping.  Contractions: Not present. Vag. Bleeding: None.  Movement: Present. Denies leaking of fluid.   The following portions of the patient's history were reviewed and updated as appropriate: allergies, current medications, past family history, past medical history, past social history, past surgical history and problem list.   Objective:   Vitals:   06/13/22 1124  BP: (!) 96/53  Pulse: 68  Weight: 151 lb (68.5 kg)    Fetal Status:     Movement: Present     General:  Alert, oriented and cooperative. Patient is in no acute distress.  Skin: Skin is warm and dry. No rash noted.   Cardiovascular: Normal heart rate noted  Respiratory: Normal respiratory effort, no problems with respiration noted  Abdomen: Soft, gravid, appropriate for gestational age.  Pain/Pressure: Absent     Pelvic: Cervical exam deferred        Extremities: Normal range of motion.  Edema: None  Mental Status: Normal mood and affect. Normal behavior. Normal judgment and thought content.   Assessment and Plan:  Pregnancy: G2P1001 at 141w3d. Supervision of other normal pregnancy, antepartum - Anatomy USK[place]ill be scheduled for 19-22 weeks  - Continue Zofran PRN for N/V - Planning to formula feed - Considering PP IUD - Peds plan: [facility]   2. History of Roux-en-Y gastric bypass  3. PCOS (polycystic ovarian syndrome)  4. Anxiety - On Lexapro   5. POTS (postural orthostatic  tachycardia syndrome) - Increased PO hydration  - Seeing cardio on Friday   6. [redacted] weeks gestation of pregnancy  7. Mild intermittent asthma without complication  Preterm labor symptoms and general obstetric precautions including but not limited to vaginal bleeding, contractions, leaking of fluid and fetal movement were reviewed in detail with the patient. Please refer to After Visit Summary for other counseling recommendations.   Return in about 4 weeks (around 07/11/2022) for LOB, In-Person, any provider.  Future Appointments  Date Time Provider [place]6/23/2023  3:20 PM PeFreada BergeronMD [facility] None    JuKerry HoughPA-C

## 2022-06-14 NOTE — Progress Notes (Deleted)
Cardio-Obstetrics Clinic  New Evaluation  Date:  06/14/2022   ID:  Vickie Sutton, DOB 06-Jan-2001, MRN [id number]  PCP:  Rosine Door   [facility] Providers Cardiologist:  Rozann Lesches, MD  Electrophysiologist:  None       Referring MD: Rosalee Kaufman, *   Chief Complaint: Syncope  History of Present Illness:    Vickie Sutton is a 21 y.o. female [G2P1001] who is being seen today for the evaluation of syncope at the request of Rosalee Kaufman, *.   Patient has been followed by Dr. Domenic Polite for episodes of syncope thought to be neurocardiogenic in nature. POTs was considered, however, syncope is not a characteristic symptom of POTs.TTE 01/2020 showed LVEF 55%, normal RV, no significant valve disease. Cardiac monitor 07/2021 with sinus brady-sinus tach. No arrhythmias. She was maintained on metoprolol.  Today, ***  Prior CV Studies Reviewed: The following studies were reviewed today: Echocardiogram 02/09/2020:  1. Left ventricular ejection fraction, by estimation, is 55%. The left  ventricle has normal function. The left ventricle has no regional wall  motion abnormalities. Left ventricular diastolic parameters were normal.   2. Right ventricular systolic function is normal. The right ventricular  size is normal. There is normal pulmonary artery systolic pressure. The  estimated right ventricular systolic pressure is 86.7 mmHg.   3. The mitral valve is grossly normal. No evidence of mitral valve  regurgitation.   4. The aortic valve is tricuspid. Aortic valve regurgitation is not  visualized.   5. The inferior vena cava is normal in size with greater than 50%  respiratory variability, suggesting right atrial pressure of 3 mmHg.    Cardiac monitor August 2022: [employer] monitor reviewed.  30 days analyzed.  Predominant rhythm is sinus with heart rate ranging from 54 bpm in sinus bradycardia up to 158 bpm in sinus tachycardia.  There were no  obvious arrhythmias documented, no pauses.  Symptoms reported as rapid heartbeat and dizziness did not necessarily correspond to a particular heart rate.    Past Medical History:  Diagnosis Date   ADD (attention deficit disorder)    Anxiety    Asthma    Depression    Migraine    Panic attacks    Polycystic ovarian syndrome    POTS (postural orthostatic tachycardia syndrome)    Syncope    Neurocardiogenic suspected    Past Surgical History:  Procedure Laterality Date   ESOPHAGOGASTRODUODENOSCOPY     LAPAROTOMY N/A 09/07/2020   Procedure: EXPLORATORY LAPAROTOMY WITH EXTRACTION  OVARIAN CYST;  Surgeon: Florian Buff, MD;  Location: [place];  Service: Gynecology;  Laterality: N/A;   OVARIAN CYST SURGERY Right    ROUX-EN-Y GASTRIC BYPASS     August 2020 - [facility]   TOOTH EXTRACTION N/A 12/12/2015   Procedure: EXTRACTION MOLARS - teeth one, sixteen, seventeen and thirty-two;  Surgeon: Diona Browner, DDS;  Location: [place];  Service: Oral Surgery;  Laterality: N/A;   { Click here to update PMH, PSH, OB Hx then refresh note  :1}   OB History     Gravida  2   Para  1   Term  1   Preterm      AB      Living  1      SAB      IAB      Ectopic      Multiple  0   Live Births  1           {  Click here to update OB Charting then refresh note  :1}    Current Medications: No outpatient medications have been marked as taking for the 06/15/22 encounter (Appointment) with Freada Bergeron, MD.     Allergies:   Patient has no known allergies.   Social History   Socioeconomic History   Marital status: Single    Spouse name: Not on file   Number of children: 1   Years of education: some college   Highest education level: Not on file  Occupational History   Occupation: Chartered certified accountant at [employer]  Tobacco Use   Smoking status: Never   Smokeless tobacco: Never  Vaping Use   Vaping Use: Former  Substance and Sexual Activity   Alcohol use: No   Drug use: No   Sexual  activity: Yes    Birth control/protection: None  Other Topics Concern   Not on file  Social History Narrative   Right-handed.   No daily caffeine.   Lives at home with boyfriend and son.    Social Determinants of Health   Financial Resource Strain: Low Risk  (05/24/2022)   Overall Financial Resource Strain (CARDIA)    Difficulty of Paying Living Expenses: Not very hard  Food Insecurity: No Food Insecurity (05/24/2022)   Hunger Vital Sign    Worried About Running Out of Food in the Last Year: Never true    Ran Out of Food in the Last Year: Never true  Transportation Needs: No Transportation Needs (05/24/2022)   PRAPARE - Hydrologist (Medical): No    Lack of Transportation (Non-Medical): No  Physical Activity: Insufficiently Active (05/24/2022)   Exercise Vital Sign    Days of Exercise per Week: 4 days    Minutes of Exercise per Session: 30 min  Stress: Stress Concern Present (05/24/2022)   [place]    Feeling of Stress : To some extent  Social Connections: Socially Isolated (05/24/2022)   Social Connection and Isolation Panel [NHANES]    Frequency of Communication with Friends and Family: More than three times a week    Frequency of Social Gatherings with Friends and Family: More than three times a week    Attends Religious Services: Never    Marine scientist or Organizations: No    Attends Music therapist: Never    Marital Status: Never married  { Click here to update SDOH then refresh :1}    Family History  Problem Relation Age of Onset   Diabetes Mellitus II Maternal Grandmother    Heart disease Maternal Grandmother    Obesity Mother    Yves Dill Parkinson White syndrome Brother    Seizures Maternal Aunt    Pneumonia Son    { Click here to update FH then refresh note    :1}   ROS:   Please see the history of present illness.    *** All other systems reviewed  and are negative.   Labs/EKG Reviewed:    EKG:   EKG is *** ordered today.  The ekg ordered today demonstrates ***  Recent Labs: 02/26/2022: TSH 1.610 04/21/2022: ALT 13; BUN 8; Creatinine, Ser 0.53; Potassium 3.8; Sodium 136 05/24/2022: Hemoglobin 12.2; Platelets 242   Recent Lipid Panel No results found for: "CHOL", "TRIG", "HDL", "CHOLHDL", "LDLCALC", "LDLDIRECT"  Physical Exam:    VS:  LMP 02/19/2022 (Exact Date)     Wt Readings from Last 3 Encounters:  06/13/22 151  lb (68.5 kg)  06/07/22 154 lb (69.9 kg)  05/24/22 153 lb (69.4 kg)     GEN: *** Well nourished, well developed in no acute distress HEENT: Normal NECK: No JVD; No carotid bruits LYMPHATICS: No lymphadenopathy CARDIAC: ***RRR, no murmurs, rubs, gallops RESPIRATORY:  Clear to auscultation without rales, wheezing or rhonchi  ABDOMEN: Soft, non-tender, non-distended MUSCULOSKELETAL:  No edema; No deformity  SKIN: Warm and dry NEUROLOGIC:  Alert and oriented x 3 PSYCHIATRIC:  Normal affect    Risk Assessment/Risk Calculators:   { Click to calculate CARPREG II - THEN refresh note :1}    { Click to caclulate Mod WHO Class of CV Risk - THEN refresh note :1}     { Click for [id number] Score - THEN Refresh Note    :[id number]}      ASSESSMENT & PLAN:    #Syncope: Followed by Dr. Domenic Polite. Thought to be neurocardiogenic in nature. Cardiac monitor without arrhythmias. Likely exacerbated by the hemodynamic changes in pregnancy. Was previously on metop and midodrine, however, these have since been held due to pregnancy.  There are no Patient Instructions on file for this visit.   Dispo:  No follow-ups on file.   Medication Adjustments/Labs and Tests Ordered: Current medicines are reviewed at length with the patient today.  Concerns regarding medicines are outlined above.  Tests Ordered: No orders of the defined types were placed in this encounter.  Medication Changes: No orders of the defined types were  placed in this encounter.

## 2022-06-15 ENCOUNTER — Ambulatory Visit: Payer: Medicaid Other | Admitting: Cardiology

## 2022-06-19 ENCOUNTER — Inpatient Hospital Stay (HOSPITAL_COMMUNITY)
Admission: AD | Admit: 2022-06-19 | Discharge: 2022-06-19 | Disposition: A | Discharge status: Home / Self Care | Payer: Medicaid Other | Attending: Obstetrics and Gynecology | Admitting: Obstetrics and Gynecology

## 2022-06-19 ENCOUNTER — Ambulatory Visit: Payer: 59 | Admitting: Cardiology

## 2022-06-19 ENCOUNTER — Encounter (HOSPITAL_COMMUNITY): Payer: Self-pay | Admitting: Obstetrics and Gynecology

## 2022-06-19 DIAGNOSIS — Z3A16 16 weeks gestation of pregnancy: Secondary | ICD-10-CM | POA: Diagnosis not present

## 2022-06-19 DIAGNOSIS — G90A Postural orthostatic tachycardia syndrome (POTS): Secondary | ICD-10-CM | POA: Insufficient documentation

## 2022-06-19 DIAGNOSIS — O219 Vomiting of pregnancy, unspecified: Secondary | ICD-10-CM | POA: Insufficient documentation

## 2022-06-19 DIAGNOSIS — F410 Panic disorder [episodic paroxysmal anxiety] without agoraphobia: Secondary | ICD-10-CM | POA: Insufficient documentation

## 2022-06-19 DIAGNOSIS — O99512 Diseases of the respiratory system complicating pregnancy, second trimester: Secondary | ICD-10-CM | POA: Insufficient documentation

## 2022-06-19 DIAGNOSIS — O99342 Other mental disorders complicating pregnancy, second trimester: Secondary | ICD-10-CM | POA: Diagnosis not present

## 2022-06-19 DIAGNOSIS — R1030 Lower abdominal pain, unspecified: Secondary | ICD-10-CM | POA: Insufficient documentation

## 2022-06-19 DIAGNOSIS — F419 Anxiety disorder, unspecified: Secondary | ICD-10-CM | POA: Diagnosis not present

## 2022-06-19 DIAGNOSIS — O26892 Other specified pregnancy related conditions, second trimester: Secondary | ICD-10-CM | POA: Diagnosis present

## 2022-06-19 DIAGNOSIS — R531 Weakness: Secondary | ICD-10-CM | POA: Diagnosis not present

## 2022-06-19 DIAGNOSIS — J45909 Unspecified asthma, uncomplicated: Secondary | ICD-10-CM | POA: Insufficient documentation

## 2022-06-19 DIAGNOSIS — O99352 Diseases of the nervous system complicating pregnancy, second trimester: Secondary | ICD-10-CM | POA: Diagnosis not present

## 2022-06-19 LAB — URINALYSIS, ROUTINE W REFLEX MICROSCOPIC
Bacteria, UA: NONE SEEN
Bilirubin Urine: NEGATIVE
Glucose, UA: NEGATIVE mg/dL
Hgb urine dipstick: NEGATIVE
Ketones, ur: NEGATIVE mg/dL
Leukocytes,Ua: NEGATIVE
Nitrite: NEGATIVE
Protein, ur: 30 mg/dL — AB
Specific Gravity, Urine: 1.032 — ABNORMAL HIGH (ref 1.005–1.030)
pH: 6 (ref 5.0–8.0)

## 2022-06-19 MED ORDER — FAMOTIDINE IN NACL 20-0.9 MG/50ML-% IV SOLN
20.0000 mg | Freq: Once | INTRAVENOUS | Status: AC
  Administered 2022-06-19: 20 mg via INTRAVENOUS
  Filled 2022-06-19: qty 50

## 2022-06-19 MED ORDER — SODIUM CHLORIDE 0.9 % IV SOLN
25.0000 mg | Freq: Once | INTRAVENOUS | Status: DC
  Filled 2022-06-19: qty 1

## 2022-06-19 MED ORDER — LACTATED RINGERS IV BOLUS
250.0000 mL | Freq: Once | INTRAVENOUS | Status: AC
  Administered 2022-06-19: 250 mL via INTRAVENOUS

## 2022-06-19 MED ORDER — ONDANSETRON HCL 4 MG/2ML IJ SOLN
4.0000 mg | Freq: Once | INTRAMUSCULAR | Status: AC
  Administered 2022-06-19: 4 mg via INTRAVENOUS
  Filled 2022-06-19: qty 2

## 2022-06-19 NOTE — MAU Provider Note (Signed)
Chief Complaint:  Emesis   Event Date/Time   First Provider Initiated Contact with Patient 06/19/22 0544     HPI: Vickie Sutton is a 21 y.o. G2P1001 at 52w2dwho presents to maternity admissions reporting severe nausea/vomiting, lower abdominal cramping and overall weakness/exhaustion from constant vomiting. This has been an ongoing problem for her, she takes zofran to control her symptoms. Last dose was at 10pm yesterday followed by 2-3 more episodes of emesis. Also reports skin breakdown and "leakage" from a previous incision site where she had a cyst removed. Denies vaginal bleeding, leaking of fluid, fever, falls, or recent illness.   Pregnancy Course: Receives prenatal care from [facility]. Has a hx of a Roux-en-y gastric bypass, POTS with syncope, panic attacks, anxiety and asthma.  Past Medical History:  Diagnosis Date   ADD (attention deficit disorder)    Anxiety    Asthma    Depression    Migraine    Panic attacks    Polycystic ovarian syndrome    POTS (postural orthostatic tachycardia syndrome)    Syncope    Neurocardiogenic suspected   OB History  Gravida Para Term Preterm AB Living  '2 1 1     1  '$ SAB IAB Ectopic Multiple Live Births        0 1    # Outcome Date GA Lbr Len/2nd Weight Sex Delivery Anes PTL Lv  2 Current           1 Term 02/10/21 356w3d0:40 / 04:23 6 lb 10 oz (3.005 kg) M Vag-Spont EPI N LIV   Past Surgical History:  Procedure Laterality Date   ESOPHAGOGASTRODUODENOSCOPY     LAPAROTOMY N/A 09/07/2020   Procedure: EXPLORATORY LAPAROTOMY WITH EXTRACTION  OVARIAN CYST;  Surgeon: EuFlorian BuffMD;  [place] Service: Gynecology;  Laterality: N/A;   OVARIAN CYST SURGERY Right    ROUX-EN-Y GASTRIC BYPASS     August 2020 - [facility]   TOOTH EXTRACTION N/A 12/12/2015   Procedure: EXTRACTION MOLARS - teeth one, sixteen, seventeen and thirty-two;  Surgeon: ScDiona BrownerDDS;  [place] Service: Oral Surgery;  Laterality: N/A;   Family  History  Problem Relation Age of Onset   Diabetes Mellitus II Maternal Grandmother    Heart disease Maternal Grandmother    Obesity Mother    WoYves Dillarkinson White syndrome Brother    Seizures Maternal Aunt    Pneumonia Son    Social History   Tobacco Use   Smoking status: Never   Smokeless tobacco: Never  Vaping Use   Vaping Use: Former  Substance Use Topics   Alcohol use: No   Drug use: No   No Known Allergies Medications Prior to Admission  Medication Sig Dispense Refill Last Dose   escitalopram (LEXAPRO) 20 MG tablet Take 20 mg by mouth daily.   06/19/2022   ondansetron (ZOFRAN-ODT) 4 MG disintegrating tablet Take 1 tablet (4 mg total) by mouth every 8 (eight) hours as needed for nausea or vomiting. (Patient taking differently: Take 4 mg by mouth as needed for nausea or vomiting.) 20 tablet 3 06/18/2022 at 2200   Prenatal Vit-Fe Fumarate-FA (PRENATAL MULTIVITAMIN) TABS tablet Take 1 tablet by mouth daily at 12 noon.   06/18/2022   albuterol (VENTOLIN HFA) 108 (90 Base) MCG/ACT inhaler Inhale 2 puffs into the lungs as needed for wheezing or shortness of breath. (Patient not taking: Reported on 06/13/2022)   More than a month   I have reviewed patient's  Past Medical Hx, Surgical Hx, Family Hx, Social Hx, medications and allergies.   ROS:  Review of Systems  Constitutional:  Positive for fatigue. Negative for fever.  Gastrointestinal:  Positive for abdominal pain, nausea and vomiting.  Neurological:  Positive for weakness.   Physical Exam  Patient Vitals for the past 24 hrs:  BP Temp Temp src Pulse Resp Height Weight  06/19/22 0503 98/60 97.8 F (36.6 C) Oral 63 18 '5\' 5"'$  (1.651 m) 151 lb 1.6 oz (68.5 kg)   Constitutional: Well-developed, well-nourished female, weak appearing Cardiovascular: normal rate & rhythm Respiratory: normal effort, no problems with respiration noted GI: Abd soft, non-tender MS: Extremities nontender, no edema, normal ROM Neurologic: Alert and  oriented x 4.  GU: no CVA tenderness Pelvic: exam deferred, RN collected wet prep  FHR: 139   Labs: Results for orders placed or performed during the hospital encounter of 06/19/22 (from the past 24 hour(s))  Urinalysis, Routine w reflex microscopic Urine, Clean Catch     Status: Abnormal   Collection Time: 06/19/22  4:51 AM  Result Value Ref Range   Color, Urine AMBER (A) YELLOW   APPearance HAZY (A) CLEAR   Specific Gravity, Urine 1.032 (H) 1.005 - 1.030   pH 6.0 5.0 - 8.0   Glucose, UA NEGATIVE NEGATIVE mg/dL   Hgb urine dipstick NEGATIVE NEGATIVE   Bilirubin Urine NEGATIVE NEGATIVE   Ketones, ur NEGATIVE NEGATIVE mg/dL   Protein, ur 30 (A) NEGATIVE mg/dL   Nitrite NEGATIVE NEGATIVE   Leukocytes,Ua NEGATIVE NEGATIVE   RBC / HPF 0-5 0 - 5 RBC/hpf   WBC, UA 0-5 0 - 5 WBC/hpf   Bacteria, UA NONE SEEN NONE SEEN   Squamous Epithelial / LPF 6-10 0 - 5   Mucus PRESENT     Imaging:  No results found.  MAU Course: Orders Placed This Encounter  Procedures   Urinalysis, Routine w reflex microscopic Urine, Clean Catch   Discharge patient   Meds ordered this encounter  Medications   famotidine (PEPCID) IVPB 20 mg premix   DISCONTD: promethazine (PHENERGAN) 25 mg in sodium chloride 0.9 % 1,000 mL infusion   ondansetron (ZOFRAN) injection 4 mg   lactated ringers bolus 250 mL   MDM: LR bolus with IV zofran and pepcid ordered with complete relief of nausea and abdominal discomfort. Pt still feeling weak, so ordered another bag of LR for rehydration. Once finished, pt stable for discharge. Message sent to [facility] providers suggesting weekly infusions for LR and IV meds to help prevent POTS and N/V exacerbations.  Assessment: 1. Nausea and vomiting during pregnancy   2. POTS (postural orthostatic tachycardia syndrome)   3. [redacted] weeks gestation of pregnancy    Plan: Discharge home in stable condition with return precautions     Follow-up Information     [facility] Follow  up.   Specialty: Obstetrics and Gynecology Why: as scheduled for ongoing prenatal care Contact information: [place] [phone]                Allergies as of 06/19/2022   No Known Allergies      Medication List     TAKE these medications    albuterol 108 (90 Base) MCG/ACT inhaler Commonly known as: VENTOLIN HFA Inhale 2 puffs into the lungs as needed for wheezing or shortness of breath.   escitalopram 20 MG tablet Commonly known as: LEXAPRO Take 20 mg by mouth daily.  ondansetron 4 MG disintegrating tablet Commonly known as: ZOFRAN-ODT Take 1 tablet (4 mg total) by mouth every 8 (eight) hours as needed for nausea or vomiting. What changed: when to take this   prenatal multivitamin Tabs tablet Take 1 tablet by mouth daily at 12 noon.       Gaylan Gerold, CNM, MSN, [place] Certified Nurse Midwife, [facility]

## 2022-06-20 ENCOUNTER — Telehealth: Payer: Self-pay | Admitting: Obstetrics & Gynecology

## 2022-06-20 ENCOUNTER — Other Ambulatory Visit: Payer: Self-pay | Admitting: Obstetrics & Gynecology

## 2022-06-20 ENCOUNTER — Other Ambulatory Visit: Payer: Self-pay | Admitting: Student

## 2022-06-20 NOTE — Telephone Encounter (Signed)
Patient calling stating that she went to MAU yesterday and states that she is not able to keep anything down and counties to loss weight. The nausea meds is not helping and she states that they told her at the hospital that she may need to do infusions twice weekly. Patient is asking what could be done.

## 2022-06-20 NOTE — Progress Notes (Signed)
Orders for fluid infusion with phenergan

## 2022-06-25 ENCOUNTER — Encounter (HOSPITAL_COMMUNITY)
Admission: RE | Admit: 2022-06-25 | Discharge: 2022-06-25 | Disposition: A | Discharge status: Home / Self Care | Payer: Medicaid Other | Source: Ambulatory Visit | Attending: Obstetrics & Gynecology | Admitting: Obstetrics & Gynecology

## 2022-06-25 DIAGNOSIS — R55 Syncope and collapse: Secondary | ICD-10-CM | POA: Insufficient documentation

## 2022-06-25 DIAGNOSIS — R569 Unspecified convulsions: Secondary | ICD-10-CM | POA: Diagnosis not present

## 2022-06-25 MED ORDER — SODIUM CHLORIDE 0.9 % IV SOLN
25.0000 mg | INTRAVENOUS | Status: DC
  Administered 2022-06-25: 25 mg via INTRAVENOUS
  Filled 2022-06-25: qty 1

## 2022-06-28 ENCOUNTER — Encounter (HOSPITAL_COMMUNITY)
Admission: RE | Admit: 2022-06-28 | Discharge: 2022-06-28 | Disposition: A | Discharge status: Home / Self Care | Payer: Medicaid Other | Source: Ambulatory Visit | Attending: Obstetrics & Gynecology | Admitting: Obstetrics & Gynecology

## 2022-06-28 DIAGNOSIS — R55 Syncope and collapse: Secondary | ICD-10-CM | POA: Diagnosis not present

## 2022-06-28 MED ORDER — SODIUM CHLORIDE 0.9 % IV SOLN
25.0000 mg | INTRAVENOUS | Status: DC
  Administered 2022-06-28: 25 mg via INTRAVENOUS
  Filled 2022-06-28: qty 1

## 2022-06-29 NOTE — Progress Notes (Unsigned)
Cardio-Obstetrics Clinic  New Evaluation  Date:  06/29/2022   ID:  Vickie Sutton, DOB [birth year], MRN [id number]  PCP:  Rosine Door   [facility] Providers Cardiologist:  Rozann Lesches, MD  Electrophysiologist:  None       Referring MD: Rosalee Kaufman, *   Chief Complaint: Syncope  History of Present Illness:    Vickie Sutton is a 21 y.o. female [G2P1001] who is being seen today for the evaluation of syncope at the request of Rosalee Kaufman, *.   Patient has been followed by Dr. Domenic Polite for episodes of syncope thought to be neurocardiogenic in nature. POTs was considered, however, syncope is not a characteristic symptom of POTs.TTE 01/2020 showed LVEF 55%, normal RV, no significant valve disease. Cardiac monitor 07/2021 with sinus brady-sinus tach. No arrhythmias. She was maintained on metoprolol.  Today, ***  Prior CV Studies Reviewed: The following studies were reviewed today: Echocardiogram 02/09/2020:  1. Left ventricular ejection fraction, by estimation, is 55%. The left  ventricle has normal function. The left ventricle has no regional wall  motion abnormalities. Left ventricular diastolic parameters were normal.   2. Right ventricular systolic function is normal. The right ventricular  size is normal. There is normal pulmonary artery systolic pressure. The  estimated right ventricular systolic pressure is 02.4 mmHg.   3. The mitral valve is grossly normal. No evidence of mitral valve  regurgitation.   4. The aortic valve is tricuspid. Aortic valve regurgitation is not  visualized.   5. The inferior vena cava is normal in size with greater than 50%  respiratory variability, suggesting right atrial pressure of 3 mmHg.    Cardiac monitor August 2022: Prevent[employer] monitor reviewed.  30 days analyzed.  Predominant rhythm is sinus with heart rate ranging from 54 bpm in sinus bradycardia up to 158 bpm in sinus tachycardia.  There were no  obvious arrhythmias documented, no pauses.  Symptoms reported as rapid heartbeat and dizziness did not necessarily correspond to a particular heart rate.    Past Medical History:  Diagnosis Date   ADD (attention deficit disorder)    Anxiety    Asthma    Depression    Migraine    Panic attacks    Polycystic ovarian syndrome    POTS (postural orthostatic tachycardia syndrome)    Syncope    Neurocardiogenic suspected    Past Surgical History:  Procedure Laterality Date   ESOPHAGOGASTRODUODENOSCOPY     LAPAROTOMY N/A 09/07/2020   Procedure: EXPLORATORY LAPAROTOMY WITH EXTRACTION  OVARIAN CYST;  Surgeon: Florian Buff, MD;  Location: [place];  Service: Gynecology;  Laterality: N/A;   OVARIAN CYST SURGERY Right    ROUX-EN-Y GASTRIC BYPASS     August 2020 - [facility]   TOOTH EXTRACTION N/A 12/12/2015   Procedure: EXTRACTION MOLARS - teeth one, sixteen, seventeen and thirty-two;  Surgeon: Diona Browner, DDS;  Location: [place];  Service: Oral Surgery;  Laterality: N/A;   { Click here to update PMH, PSH, OB Hx then refresh note  :1}   OB History     Gravida  2   Para  1   Term  1   Preterm      AB      Living  1      SAB      IAB      Ectopic      Multiple  0   Live Births  1           {  Click here to update OB Charting then refresh note  :1}    Current Medications: No outpatient medications have been marked as taking for the 07/03/22 encounter (Appointment) with Freada Bergeron, MD.     Allergies:   Patient has no known allergies.   Social History   Socioeconomic History   Marital status: Single    Spouse name: Not on file   Number of children: 1   Years of education: some college   Highest education level: Not on file  Occupational History   Occupation: Chartered certified accountant at [employer]  Tobacco Use   Smoking status: Never   Smokeless tobacco: Never  Vaping Use   Vaping Use: Former  Substance and Sexual Activity   Alcohol use: No   Drug use: No   Sexual  activity: Yes    Birth control/protection: None  Other Topics Concern   Not on file  Social History Narrative   Right-handed.   No daily caffeine.   Lives at home with boyfriend and son.    Social Determinants of Health   Financial Resource Strain: Low Risk  (05/24/2022)   Overall Financial Resource Strain (CARDIA)    Difficulty of Paying Living Expenses: Not very hard  Food Insecurity: No Food Insecurity (05/24/2022)   Hunger Vital Sign    Worried About Running Out of Food in the Last Year: Never true    Ran Out of Food in the Last Year: Never true  Transportation Needs: No Transportation Needs (05/24/2022)   PRAPARE - Hydrologist (Medical): No    Lack of Transportation (Non-Medical): No  Physical Activity: Insufficiently Active (05/24/2022)   Exercise Vital Sign    Days of Exercise per Week: 4 days    Minutes of Exercise per Session: 30 min  Stress: Stress Concern Present (05/24/2022)   [place]    Feeling of Stress : To some extent  Social Connections: Socially Isolated (05/24/2022)   Social Connection and Isolation Panel [NHANES]    Frequency of Communication with Friends and Family: More than three times a week    Frequency of Social Gatherings with Friends and Family: More than three times a week    Attends Religious Services: Never    Marine scientist or Organizations: No    Attends Music therapist: Never    Marital Status: Never married  { Click here to update SDOH then refresh :1}    Family History  Problem Relation Age of Onset   Diabetes Mellitus II Maternal Grandmother    Heart disease Maternal Grandmother    Obesity Mother    Yves Dill Parkinson White syndrome Brother    Seizures Maternal Aunt    Pneumonia Son    { Click here to update FH then refresh note    :1}   ROS:   Please see the history of present illness.    *** All other systems reviewed  and are negative.   Labs/EKG Reviewed:    EKG:   EKG is *** ordered today.  The ekg ordered today demonstrates ***  Recent Labs: 02/26/2022: TSH 1.610 04/21/2022: ALT 13; BUN 8; Creatinine, Ser 0.53; Potassium 3.8; Sodium 136 05/24/2022: Hemoglobin 12.2; Platelets 242   Recent Lipid Panel No results found for: "CHOL", "TRIG", "HDL", "CHOLHDL", "LDLCALC", "LDLDIRECT"  Physical Exam:    VS:  LMP 02/19/2022 (Exact Date)     Wt Readings from Last 3 Encounters:  06/28/22 151  lb (68.5 kg)  06/19/22 151 lb 1.6 oz (68.5 kg)  06/13/22 151 lb (68.5 kg)     GEN: *** Well nourished, well developed in no acute distress HEENT: Normal NECK: No JVD; No carotid bruits LYMPHATICS: No lymphadenopathy CARDIAC: ***RRR, no murmurs, rubs, gallops RESPIRATORY:  Clear to auscultation without rales, wheezing or rhonchi  ABDOMEN: Soft, non-tender, non-distended MUSCULOSKELETAL:  No edema; No deformity  SKIN: Warm and dry NEUROLOGIC:  Alert and oriented x 3 PSYCHIATRIC:  Normal affect    Risk Assessment/Risk Calculators:   { Click to calculate CARPREG II - THEN refresh note :1}    { Click to caclulate Mod WHO Class of CV Risk - THEN refresh note :1}     { Click for [id number] Score - THEN Refresh Note    :[id number]}      ASSESSMENT & PLAN:    #Syncope: Followed by Dr. Domenic Polite. Thought to be neurocardiogenic in nature. Cardiac monitor without arrhythmias. Likely exacerbated by the hemodynamic changes in pregnancy. Was previously on metop and midodrine, however, these have since been held due to pregnancy.  There are no Patient Instructions on file for this visit.   Dispo:  No follow-ups on file.   Medication Adjustments/Labs and Tests Ordered: Current medicines are reviewed at length with the patient today.  Concerns regarding medicines are outlined above.  Tests Ordered: No orders of the defined types were placed in this encounter.  Medication Changes: No orders of the defined  types were placed in this encounter.

## 2022-07-02 ENCOUNTER — Encounter (HOSPITAL_COMMUNITY)
Admission: RE | Admit: 2022-07-02 | Discharge: 2022-07-02 | Disposition: A | Discharge status: Home / Self Care | Payer: Medicaid Other | Source: Ambulatory Visit | Attending: Obstetrics & Gynecology | Admitting: Obstetrics & Gynecology

## 2022-07-02 DIAGNOSIS — R55 Syncope and collapse: Secondary | ICD-10-CM | POA: Diagnosis not present

## 2022-07-02 MED ORDER — SODIUM CHLORIDE 0.9 % IV SOLN
25.0000 mg | INTRAVENOUS | Status: DC
  Administered 2022-07-02: 25 mg via INTRAVENOUS
  Filled 2022-07-02: qty 1

## 2022-07-03 ENCOUNTER — Encounter: Payer: Self-pay | Admitting: Cardiology

## 2022-07-03 ENCOUNTER — Ambulatory Visit (INDEPENDENT_AMBULATORY_CARE_PROVIDER_SITE_OTHER): Payer: Medicaid Other | Admitting: Cardiology

## 2022-07-03 VITALS — BP 91/61 | HR 79 | Ht 65.0 in | Wt 150.6 lb

## 2022-07-03 DIAGNOSIS — R002 Palpitations: Secondary | ICD-10-CM | POA: Diagnosis not present

## 2022-07-03 DIAGNOSIS — R55 Syncope and collapse: Secondary | ICD-10-CM | POA: Diagnosis not present

## 2022-07-03 NOTE — Patient Instructions (Addendum)
Medication Instructions:   Your physician recommends that you continue on your current medications as directed. Please refer to the Current Medication list given to you today.  *If you need a refill on your cardiac medications before your next appointment, please call your pharmacy*   Follow-Up: At [facility], you and your health needs are our priority.  As part of our continuing mission to provide you with exceptional heart care, we have created designated Provider Care Teams.  These Care Teams include your primary Cardiologist (physician) and Advanced Practice Providers (APPs -  Physician Assistants and Nurse Practitioners) who all work together to provide you with the care you need, when you need it.  We recommend signing up for the patient portal called "MyChart".  Sign up information is provided on this After Visit Summary.  MyChart is used to connect with patients for Virtual Visits (Telemedicine).  Patients are able to view lab/test results, encounter notes, upcoming appointments, etc.  Non-urgent messages can be sent to your provider as well.   To learn more about what you can do with MyChart, go to [email or web address].    Your next appointment:   4 week(s)  The format for your next appointment:   In Person  Provider:   WITH DR. Johney Frame AT [place] {    Other Instructions Orthostatic Hypotension Blood pressure is a measurement of how strongly, or weakly, your circulating blood is pressing against the walls of your arteries. Orthostatic hypotension is a drop in blood pressure that can happen when you change positions, such as when you go from lying down to standing. Arteries are blood vessels that carry blood from your heart throughout your body. When blood pressure is too low, you may not get enough blood to your brain or to the rest of your organs. Orthostatic hypotension can cause light-headedness, sweating, rapid heartbeat, blurred vision, and fainting.  These symptoms require further investigation into the cause. What are the causes? Orthostatic hypotension can be caused by many things, including: Sudden changes in posture, such as standing up quickly after you have been sitting or lying down. Loss of blood (anemia) or loss of body fluids (dehydration). Heart problems, neurologic problems, or hormone problems. Pregnancy. Aging. The risk for this condition increases as you get older. Severe infection (sepsis). Certain medicines, such as medicines for high blood pressure or medicines that make the body lose excess fluids (diuretics). What are the signs or symptoms? Symptoms of this condition may include: Weakness, light-headedness, or dizziness. Sweating. Blurred vision. Tiredness (fatigue). Rapid heartbeat. Fainting, in severe cases. How is this diagnosed? This condition is diagnosed based on: Your symptoms and medical history. Your blood pressure measurements. Your health care provider will check your blood pressure when you are: Lying down. Sitting. Standing. A blood pressure reading is recorded as two numbers, such as "120 over 80" (or 120/80). The first ("top") number is called the systolic pressure. It is a measure of the pressure in your arteries as your heart beats. The second ("bottom") number is called the diastolic pressure. It is a measure of the pressure in your arteries when your heart relaxes between beats. Blood pressure is measured in a unit called mmHg. Healthy blood pressure for most adults is 120/80 mmHg. Orthostatic hypotension is defined as a 20 mmHg drop in systolic pressure or a 10 mmHg drop in diastolic pressure within 3 minutes of standing. Other information or tests that may be used to diagnose orthostatic hypotension include: Your other vital signs, such  as your heart rate and temperature. Blood tests. An electrocardiogram (ECG) or echocardiogram. A Holter monitor. This is a device you wear that records your  heart rhythm continuously, usually for 24-48 hours. Tilt table test. For this test, you will be safely secured to a table that moves you from a lying position to an upright position. Your heart rhythm and blood pressure will be monitored during the test. How is this treated? This condition may be treated by: Changing your diet. This may involve eating more salt (sodium) or drinking more water. Changing the dosage of certain medicines you are taking that might be lowering your blood pressure. Correcting the underlying reason for the orthostatic hypotension. Wearing compression stockings. Taking medicines to raise your blood pressure. Avoiding actions that trigger symptoms. Follow these instructions at home: Medicines Take over-the-counter and prescription medicines only as told by your health care provider. Follow instructions from your health care provider about changing the dosage of your current medicines, if this applies. Do not stop or adjust any of your medicines on your own. Eating and drinking Illustration of a person drinking a glass of water.   Drink enough fluid to keep your urine pale yellow. Eat extra salt only as directed. Do not add extra salt to your diet unless advised by your health care provider. Eat frequent, small meals. Avoid standing up suddenly after eating. General instructions Compression stockings on a person's lower legs.   Get up slowly from lying down or sitting positions. This gives your blood pressure a chance to adjust. Avoid hot showers and excessive heat as directed by your health care provider. Engage in regular physical activity as directed by your health care provider. If you have compression stockings, wear them as told. Keep all follow-up visits. This is important. Contact a health care provider if: You have a fever for more than 2-3 days. You feel more thirsty than usual. You feel dizzy or weak. Get help right away if: You have chest  pain. You have a fast or irregular heartbeat. You become sweaty or feel light-headed. You feel short of breath. You faint. You have any symptoms of a stroke. "BE FAST" is an easy way to remember the main warning signs of a stroke: B - Balance. Signs are dizziness, sudden trouble walking, or loss of balance. E - Eyes. Signs are trouble seeing or a sudden change in vision. F - Face. Signs are sudden weakness or numbness of the face, or the face or eyelid drooping on one side. A - Arms. Signs are weakness or numbness in an arm. This happens suddenly and usually on one side of the body. S - Speech. Signs are sudden trouble speaking, slurred speech, or trouble understanding what people say. T - Time. Time to call emergency services. Write down what time symptoms started. You have other signs of a stroke, such as: A sudden, severe headache with no known cause. Nausea or vomiting. Seizure. These symptoms may represent a serious problem that is an emergency. Do not wait to see if the symptoms will go away. Get medical help right away. Call your local emergency services (911 in the [place] Do not drive yourself to the hospital. Summary Orthostatic hypotension is a sudden drop in blood pressure. It can cause light-headedness, sweating, rapid heartbeat, blurred vision, and fainting. Orthostatic hypotension can be diagnosed by having your blood pressure taken while lying down, sitting, and then standing. Treatment may involve changing your diet, wearing compression stockings, sitting up slowly, adjusting your  medicines, or correcting the underlying reason for the orthostatic hypotension. Get help right away if you have chest pain, a fast or irregular heartbeat, or symptoms of a stroke. This information is not intended to replace advice given to you by your health care provider. Make sure you discuss any questions you have with your health care provider. Document Revised: 02/23/2021 Document Reviewed:  02/23/2021 [employer] Patient Education  [place]

## 2022-07-03 NOTE — Progress Notes (Signed)
Cardio-Obstetrics Clinic  New Evaluation  Date:  07/03/2022   ID:  Vickie Sutton, DOB 08-24-2001, MRN [id number]  PCP:  Rosine Door   [facility] Providers Cardiologist:  Rozann Lesches, MD  Electrophysiologist:  None       Referring MD: Rosalee Kaufman, *   Chief Complaint: Syncope  History of Present Illness:    Vickie Sutton is a 21 y.o. female [G2P1001] who was previously seen for the evaluation of syncope at the request of Rosalee Kaufman, *. Presents today for follow up.   Patient was followed by Dr. Domenic Polite for episodes of syncope thought to be neurocardiogenic in nature.TTE 01/2020 showed LVEF 55%, normal RV, no significant valve disease. Cardiac monitor 07/2021 with sinus brady-sinus tach. No arrhythmias. She was maintained on metoprolol.   She has since followed with [facility] and has been diagnosed with vasovagal syncope which has been conservatively managed.  Today, She is accompanied with her 75 month old son. She is currently [redacted] weeks pregnant. She has been suffering with episodes of lightheadedness, dizziness and episodes of syncope during her pregnancy. Symptoms are consistent with her vasovagal events in the past where she has prodromal symptoms where she feels unwell and like she is going to pass out. Following the events, she comes back to and feels wiped out. Notably, she has been nauseas and vomiting throughout her pregnancy and has been requiring IV fluid and phenergan infusions. She states when she receives the phenergan, her blood pressure drops and she feels poorly for 2 days afterwards.  Has occasional palpitations and chest discomfort but these are not exertion related. No orthopnea, PND, or LE edema.  Prior CV Studies Reviewed: The following studies were reviewed today: Echocardiogram 02/09/2020:  1. Left ventricular ejection fraction, by estimation, is 55%. The left  ventricle has normal function. The left ventricle  has no regional wall  motion abnormalities. Left ventricular diastolic parameters were normal.   2. Right ventricular systolic function is normal. The right ventricular  size is normal. There is normal pulmonary artery systolic pressure. The  estimated right ventricular systolic pressure is 18.5 mmHg.   3. The mitral valve is grossly normal. No evidence of mitral valve  regurgitation.   4. The aortic valve is tricuspid. Aortic valve regurgitation is not  visualized.   5. The inferior vena cava is normal in size with greater than 50%  respiratory variability, suggesting right atrial pressure of 3 mmHg.    Cardiac monitor August 2022: [employer] monitor reviewed.  30 days analyzed.  Predominant rhythm is sinus with heart rate ranging from 54 bpm in sinus bradycardia up to 158 bpm in sinus tachycardia.  There were no obvious arrhythmias documented, no pauses.  Symptoms reported as rapid heartbeat and dizziness did not necessarily correspond to a particular heart rate.    Past Medical History:  Diagnosis Date   ADD (attention deficit disorder)    Anxiety    Asthma    Depression    Migraine    Panic attacks    Polycystic ovarian syndrome    POTS (postural orthostatic tachycardia syndrome)    Syncope    Neurocardiogenic suspected    Past Surgical History:  Procedure Laterality Date   ESOPHAGOGASTRODUODENOSCOPY     LAPAROTOMY N/A 09/07/2020   Procedure: EXPLORATORY LAPAROTOMY WITH EXTRACTION  OVARIAN CYST;  Surgeon: Florian Buff, MD;  Location: [facility];  Service: Gynecology;  Laterality: N/A;   OVARIAN CYST SURGERY Right    ROUX-EN-Y GASTRIC  BYPASS     August 2020 - [facility]   TOOTH EXTRACTION N/A 12/12/2015   Procedure: EXTRACTION MOLARS - teeth one, sixteen, seventeen and thirty-two;  Surgeon: Diona Browner, DDS;  Location: [place];  Service: Oral Surgery;  Laterality: N/A;      OB History     Gravida  2   Para  1   Term  1   Preterm      AB      Living  1      SAB       IAB      Ectopic      Multiple  0   Live Births  1               Current Medications: Current Meds  Medication Sig   albuterol (VENTOLIN HFA) 108 (90 Base) MCG/ACT inhaler Inhale 2 puffs into the lungs as needed for wheezing or shortness of breath.   escitalopram (LEXAPRO) 20 MG tablet Take 20 mg by mouth daily.   ondansetron (ZOFRAN-ODT) 4 MG disintegrating tablet Take 1 tablet (4 mg total) by mouth every 8 (eight) hours as needed for nausea or vomiting. (Patient taking differently: Take 4 mg by mouth as needed for nausea or vomiting.)   Prenatal Vit-Fe Fumarate-FA (PRENATAL MULTIVITAMIN) TABS tablet Take 1 tablet by mouth daily at 12 noon.     Allergies:   Patient has no known allergies.   Social History   Socioeconomic History   Marital status: Single    Spouse name: Not on file   Number of children: 1   Years of education: some college   Highest education level: Not on file  Occupational History   Occupation: Chartered certified accountant at [employer]  Tobacco Use   Smoking status: Never   Smokeless tobacco: Never  Vaping Use   Vaping Use: Former  Substance and Sexual Activity   Alcohol use: No   Drug use: No   Sexual activity: Yes    Birth control/protection: None  Other Topics Concern   Not on file  Social History Narrative   Right-handed.   No daily caffeine.   Lives at home with boyfriend and son.    Social Determinants of Health   Financial Resource Strain: Low Risk  (05/24/2022)   Overall Financial Resource Strain (CARDIA)    Difficulty of Paying Living Expenses: Not very hard  Food Insecurity: No Food Insecurity (05/24/2022)   Hunger Vital Sign    Worried About Running Out of Food in the Last Year: Never true    Ran Out of Food in the Last Year: Never true  Transportation Needs: No Transportation Needs (05/24/2022)   PRAPARE - Hydrologist (Medical): No    Lack of Transportation (Non-Medical): No  Physical Activity: Insufficiently Active  (05/24/2022)   Exercise Vital Sign    Days of Exercise per Week: 4 days    Minutes of Exercise per Session: 30 min  Stress: Stress Concern Present (05/24/2022)   [place]    Feeling of Stress : To some extent  Social Connections: Socially Isolated (05/24/2022)   Social Connection and Isolation Panel [NHANES]    Frequency of Communication with Friends and Family: More than three times a week    Frequency of Social Gatherings with Friends and Family: More than three times a week    Attends Religious Services: Never    Marine scientist or Organizations: No  Attends Archivist Meetings: Never    Marital Status: Never married      Family History  Problem Relation Age of Onset   Diabetes Mellitus II Maternal Grandmother    Heart disease Maternal Grandmother    Obesity Mother    Yves Dill Parkinson White syndrome Brother    Seizures Maternal Aunt    Pneumonia Son       ROS:   Please see the history of present illness.    (+) Chest tightness/pressure (+) Palpitations (+) Shortness of breath (+) Syncope (+) Vertigo  (+) Lightheadedness (+) Nausea All other systems reviewed and are negative.   Labs/EKG Reviewed:    EKG:  EKG is personally reviewed.  07/03/2022 EKG: NSR with HR 79bpm  Recent Labs: 02/26/2022: TSH 1.610 04/21/2022: ALT 13; BUN 8; Creatinine, Ser 0.53; Potassium 3.8; Sodium 136 05/24/2022: Hemoglobin 12.2; Platelets 242   Recent Lipid Panel No results found for: "CHOL", "TRIG", "HDL", "CHOLHDL", "LDLCALC", "LDLDIRECT"  Physical Exam:    VS:  BP 91/61   Pulse 79   Ht '5\' 5"'$  (1.651 m)   Wt 150 lb 9.6 oz (68.3 kg)   LMP 02/19/2022 (Exact Date)   SpO2 98%   BMI 25.06 kg/m     Wt Readings from Last 3 Encounters:  07/03/22 150 lb 9.6 oz (68.3 kg)  06/28/22 151 lb (68.5 kg)  06/19/22 151 lb 1.6 oz (68.5 kg)     GEN:  Well nourished, well developed in no acute distress HEENT:  Normal NECK: No JVD; No carotid bruits CARDIAC: RRR, no murmurs, rubs, gallops RESPIRATORY:  Clear to auscultation without rales, wheezing or rhonchi  ABDOMEN: Gravid, soft MUSCULOSKELETAL:  No edema; No deformity  SKIN: Warm and dry NEUROLOGIC:  Alert and oriented x 3 PSYCHIATRIC:  Normal affect    Risk Assessment/Risk Calculators:   {  ASSESSMENT & PLAN:    #Vasovagal Syncope: Followed previously by Dr. Domenic Polite and more recently at [facility]. Thought to be vasovagal in nature. Cardiac monitor without arrhythmias. Likely exacerbated by the hemodynamic changes in pregnancy with possible element of orthostasis given persistent nausea, vomiting and dehydration. Will continue with conservative management with increased fluid intake including with electrolyte replacement drinks as tolerated, small frequent meals with salty snacks, compression therapy, slow position changes. Discussed that if she feels like she is going to pass out, to lay down on her side and pull her legs to her chest or elevate her legs. Agree with continued IVF as needed for dehydration. -Continue IVF for dehydration per OBGYN -Compression therapy as tolerated -Small, frequent meals as tolerated -Electrolyte drinks and hydration as able -Will follow-up in 4 weeks to reassess  Patient Instructions  Medication Instructions:   Your physician recommends that you continue on your current medications as directed. Please refer to the Current Medication list given to you today.  *If you need a refill on your cardiac medications before your next appointment, please call your pharmacy*   Follow-Up: At [facility], you and your health needs are our priority.  As part of our continuing mission to provide you with exceptional heart care, we have created designated Provider Care Teams.  These Care Teams include your primary Cardiologist (physician) and Advanced Practice Providers (APPs -  Physician Assistants and Nurse  Practitioners) who all work together to provide you with the care you need, when you need it.  We recommend signing up for the patient portal called "MyChart".  Sign up information is provided on this After Visit  Summary.  MyChart is used to connect with patients for Virtual Visits (Telemedicine).  Patients are able to view lab/test results, encounter notes, upcoming appointments, etc.  Non-urgent messages can be sent to your provider as well.   To learn more about what you can do with MyChart, go to [email or web address].    Your next appointment:   4 week(s)  The format for your next appointment:   In Person  Provider:   WITH DR. Johney Frame AT [place] {    Other Instructions Orthostatic Hypotension Blood pressure is a measurement of how strongly, or weakly, your circulating blood is pressing against the walls of your arteries. Orthostatic hypotension is a drop in blood pressure that can happen when you change positions, such as when you go from lying down to standing. Arteries are blood vessels that carry blood from your heart throughout your body. When blood pressure is too low, you may not get enough blood to your brain or to the rest of your organs. Orthostatic hypotension can cause light-headedness, sweating, rapid heartbeat, blurred vision, and fainting. These symptoms require further investigation into the cause. What are the causes? Orthostatic hypotension can be caused by many things, including: Sudden changes in posture, such as standing up quickly after you have been sitting or lying down. Loss of blood (anemia) or loss of body fluids (dehydration). Heart problems, neurologic problems, or hormone problems. Pregnancy. Aging. The risk for this condition increases as you get older. Severe infection (sepsis). Certain medicines, such as medicines for high blood pressure or medicines that make the body lose excess fluids (diuretics). What are the signs or  symptoms? Symptoms of this condition may include: Weakness, light-headedness, or dizziness. Sweating. Blurred vision. Tiredness (fatigue). Rapid heartbeat. Fainting, in severe cases. How is this diagnosed? This condition is diagnosed based on: Your symptoms and medical history. Your blood pressure measurements. Your health care provider will check your blood pressure when you are: Lying down. Sitting. Standing. A blood pressure reading is recorded as two numbers, such as "120 over 80" (or 120/80). The first ("top") number is called the systolic pressure. It is a measure of the pressure in your arteries as your heart beats. The second ("bottom") number is called the diastolic pressure. It is a measure of the pressure in your arteries when your heart relaxes between beats. Blood pressure is measured in a unit called mmHg. Healthy blood pressure for most adults is 120/80 mmHg. Orthostatic hypotension is defined as a 20 mmHg drop in systolic pressure or a 10 mmHg drop in diastolic pressure within 3 minutes of standing. Other information or tests that may be used to diagnose orthostatic hypotension include: Your other vital signs, such as your heart rate and temperature. Blood tests. An electrocardiogram (ECG) or echocardiogram. A Holter monitor. This is a device you wear that records your heart rhythm continuously, usually for 24-48 hours. Tilt table test. For this test, you will be safely secured to a table that moves you from a lying position to an upright position. Your heart rhythm and blood pressure will be monitored during the test. How is this treated? This condition may be treated by: Changing your diet. This may involve eating more salt (sodium) or drinking more water. Changing the dosage of certain medicines you are taking that might be lowering your blood pressure. Correcting the underlying reason for the orthostatic hypotension. Wearing compression stockings. Taking medicines to  raise your blood pressure. Avoiding actions that trigger symptoms. Follow these instructions at home:  Medicines Take over-the-counter and prescription medicines only as told by your health care provider. Follow instructions from your health care provider about changing the dosage of your current medicines, if this applies. Do not stop or adjust any of your medicines on your own. Eating and drinking Illustration of a person drinking a glass of water.   Drink enough fluid to keep your urine pale yellow. Eat extra salt only as directed. Do not add extra salt to your diet unless advised by your health care provider. Eat frequent, small meals. Avoid standing up suddenly after eating. General instructions Compression stockings on a person's lower legs.   Get up slowly from lying down or sitting positions. This gives your blood pressure a chance to adjust. Avoid hot showers and excessive heat as directed by your health care provider. Engage in regular physical activity as directed by your health care provider. If you have compression stockings, wear them as told. Keep all follow-up visits. This is important. Contact a health care provider if: You have a fever for more than 2-3 days. You feel more thirsty than usual. You feel dizzy or weak. Get help right away if: You have chest pain. You have a fast or irregular heartbeat. You become sweaty or feel light-headed. You feel short of breath. You faint. You have any symptoms of a stroke. "BE FAST" is an easy way to remember the main warning signs of a stroke: B - Balance. Signs are dizziness, sudden trouble walking, or loss of balance. E - Eyes. Signs are trouble seeing or a sudden change in vision. F - Face. Signs are sudden weakness or numbness of the face, or the face or eyelid drooping on one side. A - Arms. Signs are weakness or numbness in an arm. This happens suddenly and usually on one side of the body. S - Speech. Signs are sudden  trouble speaking, slurred speech, or trouble understanding what people say. T - Time. Time to call emergency services. Write down what time symptoms started. You have other signs of a stroke, such as: A sudden, severe headache with no known cause. Nausea or vomiting. Seizure. These symptoms may represent a serious problem that is an emergency. Do not wait to see if the symptoms will go away. Get medical help right away. Call your local emergency services (911 in the [place] Do not drive yourself to the hospital. Summary Orthostatic hypotension is a sudden drop in blood pressure. It can cause light-headedness, sweating, rapid heartbeat, blurred vision, and fainting. Orthostatic hypotension can be diagnosed by having your blood pressure taken while lying down, sitting, and then standing. Treatment may involve changing your diet, wearing compression stockings, sitting up slowly, adjusting your medicines, or correcting the underlying reason for the orthostatic hypotension. Get help right away if you have chest pain, a fast or irregular heartbeat, or symptoms of a stroke. This information is not intended to replace advice given to you by your health care provider. Make sure you discuss any questions you have with your health care provider. Document Revised: 02/23/2021 Document Reviewed: 02/23/2021 Elsevier Patient Education  [place]:  Follow up 4 weeks or PRN  Medication Adjustments/Labs and Tests Ordered: Current medicines are reviewed at length with the patient today.  Concerns regarding medicines are outlined above.  Tests Ordered: Orders Placed This Encounter  Procedures   EKG 12-Lead   Medication Changes: No  orders of the defined types were placed in this encounter.     I,Tinashe Williams,acting as a Education administrator for Freada Bergeron, MD.,have documented all relevant documentation on the behalf of Freada Bergeron, MD,as directed by  Freada Bergeron, MD while in the presence of Freada Bergeron, MD.   I, Freada Bergeron, MD, have reviewed all documentation for this visit. The documentation on 07/03/22 for the exam, diagnosis, procedures, and orders are all accurate and complete.

## 2022-07-05 ENCOUNTER — Encounter (HOSPITAL_COMMUNITY)
Admission: RE | Admit: 2022-07-05 | Discharge: 2022-07-05 | Disposition: A | Discharge status: Home / Self Care | Payer: Medicaid Other | Source: Ambulatory Visit | Attending: Obstetrics & Gynecology | Admitting: Obstetrics & Gynecology

## 2022-07-05 DIAGNOSIS — R55 Syncope and collapse: Secondary | ICD-10-CM | POA: Diagnosis not present

## 2022-07-05 MED ORDER — SODIUM CHLORIDE 0.9 % IV SOLN
25.0000 mg | INTRAVENOUS | Status: DC
  Filled 2022-07-05: qty 1

## 2022-07-05 MED ORDER — SODIUM CHLORIDE 0.9 % IV SOLN
Freq: Once | INTRAVENOUS | Status: AC
  Administered 2022-07-05: 1000 mL via INTRAVENOUS

## 2022-07-05 NOTE — Progress Notes (Signed)
Pt here today for NS w/ phenergan as ordered and is requesting just NS due to the way she has felt after receiving the phenergan.  States that she has been feeling better, keeping more food down and using PO Zofran at home.  Office notified and new orders received from Dr Elonda Husky.  Pt to f/u at regular scheduled OB appt on 07/13/22

## 2022-07-09 ENCOUNTER — Encounter (HOSPITAL_COMMUNITY): Payer: Medicaid Other

## 2022-07-09 ENCOUNTER — Telehealth: Payer: Self-pay

## 2022-07-09 NOTE — BH Specialist Note (Signed)
Integrated Behavioral Health via Telemedicine Visit  07/17/2022 [name] [id number]  Number of [place] Clinician visits: 1- Initial Visit  Session Start time: 0917   Session End time: 9678  Total time in minutes: 61   Referring Provider: Christin Fudge, CNM Patient/Family location: Home [facility] Provider location: [facility] [place] [facility]  All persons participating in visit: [name] and [place]   Types of Service: Individual psychotherapy and Video visit  I connected with [name] and/or [name]  n/a  via  Telephone or Video Enabled Telemedicine Application  (Video is Caregility application) and verified that I am speaking with the correct person using two identifiers. Discussed confidentiality: Yes   I discussed the limitations of telemedicine and the availability of in person appointments.  Discussed there is a possibility of technology failure and discussed alternative modes of communication if that failure occurs.  I discussed that engaging in this telemedicine visit, they consent to the provision of behavioral healthcare and the services will be billed under their insurance.  Patient and/or legal guardian expressed understanding and consented to Telemedicine visit: Yes   Presenting Concerns: Patient and/or family reports the following symptoms/concerns: Anxiety with panic after waking up from vasovagal episodes; sleep paralysis at times; all escalated with stress; taking Lexapro as prescribed; open to implementing self-coping strategy to help with stress management/awareness.  Duration of problem: Ongoing; Severity of problem:  moderately severe  Patient and/or Family's Strengths/Protective Factors: Social connections, Concrete supports in place (healthy food, safe environments, etc.), Sense of purpose, and Physical Health (exercise, healthy diet, medication compliance,  etc.)  Goals Addressed: Patient will:  Reduce symptoms of: anxiety and stress   Increase knowledge and/or ability of: self-management skills and stress reduction   Demonstrate ability to: Increase healthy adjustment to current life circumstances  Progress towards Goals: Ongoing  Interventions: Interventions utilized:  Mindfulness or Psychologist, educational and Psychoeducation and/or Health Education Standardized Assessments completed: Not Needed  Patient and/or Family Response: Patient agrees with treatment plan.   Assessment: Patient currently experiencing Anxiety disorder, unspecified.   Patient may benefit from psychoeducation and brief therapeutic interventions regarding coping with symptoms of anxiety with panic .  Plan: Follow up with behavioral health clinician on : Two weeks Behavioral recommendations:  -Continue taking Lexapro as prescribed -CALM relaxation breathing exercise twice daily (morning; at bedtime with sleep sounds); as needed throughout the day. -Read educational materials regarding coping with symptoms of anxiety with panic (on AVS) -Read through Postpartum Planner (also on After Visit Summary), use as needed  Referral(s): [place] (In Clinic)  I discussed the assessment and treatment plan with the patient and/or parent/guardian. They were provided an opportunity to ask questions and all were answered. They agreed with the plan and demonstrated an understanding of the instructions.   They were advised to call back or seek an in-person evaluation if the symptoms worsen or if the condition fails to improve as anticipated.  Garlan Fair, LCSW     05/24/2022    9:30 AM 06/16/2021    9:44 AM 08/08/2020   10:33 AM  Depression screen PHQ 2/9  Decreased Interest 0 1 0  Down, Depressed, Hopeless 0 1 0  PHQ - 2 Score 0 2 0  Altered sleeping 0 0 0  Tired, decreased energy '2 2 2  '$ Change in appetite 0 0 0  Feeling bad or failure  about yourself  0 0 0  Trouble  concentrating 0 0 0  Moving slowly or fidgety/restless 0 0 0  Suicidal thoughts 0 0 0  PHQ-9 Score '2 4 2      '$ 05/24/2022    9:30 AM 06/16/2021    9:45 AM 08/08/2020   10:33 AM  GAD 7 : Generalized Anxiety Score  Nervous, Anxious, on Edge '1 1 1  '$ Control/stop worrying 0 0 0  Worry too much - different things '1 1 1  '$ Trouble relaxing 0 0 0  Restless 0 0 0  Easily annoyed or irritable '2 1 1  '$ Afraid - awful might happen 0 0 0  Total GAD 7 Score 4 3 3

## 2022-07-09 NOTE — Telephone Encounter (Signed)
Patient states that she is having a lot of pressure, wants to speak with a nurse.

## 2022-07-09 NOTE — Telephone Encounter (Signed)
Pt reports that since last week she has been sore and sensitive on her bottom since last week. She has some discharge. I recommended coming to do a self swab and then we would have results for her appointment on Friday. Patient agreeable, coming tomorrow for swab.

## 2022-07-10 ENCOUNTER — Encounter: Payer: Self-pay | Admitting: Obstetrics & Gynecology

## 2022-07-10 ENCOUNTER — Ambulatory Visit (INDEPENDENT_AMBULATORY_CARE_PROVIDER_SITE_OTHER): Payer: Medicaid Other | Admitting: Obstetrics & Gynecology

## 2022-07-10 ENCOUNTER — Telehealth: Payer: Self-pay | Admitting: *Deleted

## 2022-07-10 VITALS — BP 97/60 | HR 78 | Wt 151.2 lb

## 2022-07-10 DIAGNOSIS — N898 Other specified noninflammatory disorders of vagina: Secondary | ICD-10-CM

## 2022-07-10 DIAGNOSIS — Z3482 Encounter for supervision of other normal pregnancy, second trimester: Secondary | ICD-10-CM

## 2022-07-10 DIAGNOSIS — Z3A19 19 weeks gestation of pregnancy: Secondary | ICD-10-CM

## 2022-07-10 LAB — POCT URINALYSIS DIPSTICK OB
Blood, UA: NEGATIVE
Glucose, UA: NEGATIVE
Ketones, UA: NEGATIVE
Leukocytes, UA: NEGATIVE
Nitrite, UA: NEGATIVE
POC,PROTEIN,UA: NEGATIVE

## 2022-07-10 NOTE — Progress Notes (Signed)
LOW-RISK PREGNANCY VISIT Patient name: Tanae Petrosky MRN [id number]  Date of birth: 09/18/2001 Chief Complaint:   Vaginal irritation  History of Present Illness:   Jazzlin Clements is a 21 y.o. G58P1001 female at 58w2dwith an Estimated Date of Delivery: 12/02/22 being seen today for ongoing management of a low-risk pregnancy.   Past couple of weeks notes discomfort around her vulva.  Notes pain and discomfort depending on how she is sitting.  Recently treated for BV and still notes discharge- white, no odor.  No itching.  Notes discomfort when she voids, burning of the skin.  Denies pelvic or abdominal pain.  Denies vaginal bleeding.     05/24/2022    9:30 AM 06/16/2021    9:44 AM 08/08/2020   10:33 AM  Depression screen PHQ 2/9  Decreased Interest 0 1 0  Down, Depressed, Hopeless 0 1 0  PHQ - 2 Score 0 2 0  Altered sleeping 0 0 0  Tired, decreased energy '2 2 2  '$ Change in appetite 0 0 0  Feeling bad or failure about yourself  0 0 0  Trouble concentrating 0 0 0  Moving slowly or fidgety/restless 0 0 0  Suicidal thoughts 0 0 0  PHQ-9 Score '2 4 2     '$ Contractions: Not present. Vag. Bleeding: None.  Movement: Present. no complaints leaking of fluid. Review of Systems:   Pertinent items are noted in HPI Denies abnormal vaginal discharge w/ itching/odor/irritation, headaches, visual changes, shortness of breath, chest pain, abdominal pain, severe nausea/vomiting, or problems with urination or bowel movements unless otherwise stated above. Pertinent History Reviewed:  Reviewed past medical,surgical, social, obstetrical and family history.  Reviewed problem list, medications and allergies.  Physical Assessment:   Vitals:   07/10/22 0923  BP: 97/60  Pulse: 78  Weight: 151 lb 3.2 oz (68.6 kg)  Body mass index is 25.16 kg/m.        Physical Examination:   General appearance: Well appearing, and in no distress  Mental status: Alert, oriented to person, place, and time  Skin: Warm &  dry  Respiratory: Normal respiratory effort, no distress  Abdomen: Soft, gravid, nontender  Pelvic:  Swelling noted of upper third of labia majora and clitoral hood, no discrete mass or lesion noted, slight scaling of clitoris noted, no labial lesions noted, vaginal mucoa- pink, no discharge or lesions, cervix closed- no discharge or lesions          Extremities: Edema: None  Psych:  mood and affect appropriate  Fetal Status:     Movement: Present    Chaperone:  Dr. AAlcus Dad    Results for orders placed or performed in visit on 07/10/22 (from the past 24 hour(s))  POC Urinalysis Dipstick OB   Collection Time: 07/10/22  9:21 AM  Result Value Ref Range   Color, UA     Clarity, UA     Glucose, UA Negative Negative   Bilirubin, UA     Ketones, UA neg    Spec Grav, UA     Blood, UA neg    pH, UA     POC,PROTEIN,UA Negative Negative, Trace, Small (1+), Moderate (2+), Large (3+), 4+   Urobilinogen, UA     Nitrite, UA neg    Leukocytes, UA Negative Negative   Appearance     Odor       Assessment & Plan:  1) Low-risk pregnancy G2P1001 at 146w2dith an Estimated Date of Delivery: 12/02/22   2)  Vulvar irritation -swabs obtained for vaginitis and HSV -further management pending results -reviewed conservative management at this time  3) OB care -Anatomy scan and appt on Friday  Meds: No orders of the defined types were placed in this encounter.  Labs/procedures today: as above  Plan:  Continue routine obstetrical care  Next visit: prefers in person     Follow-up: Return for as scheduled.  Orders Placed This Encounter  Procedures   POC Urinalysis Dipstick OB    Janyth Pupa, DO Attending [place], [facility] for [employer], [place]

## 2022-07-10 NOTE — Telephone Encounter (Signed)
Lab called and reported that cv swab arrived to lab with puncture in top. They are not able to run specimen. Dr. Nelda Marseille informed.

## 2022-07-11 ENCOUNTER — Emergency Department (HOSPITAL_COMMUNITY)
Admission: EM | Admit: 2022-07-11 | Discharge: 2022-07-11 | Disposition: A | Discharge status: Home / Self Care | Payer: Medicaid Other | Attending: Emergency Medicine | Admitting: Emergency Medicine

## 2022-07-11 ENCOUNTER — Other Ambulatory Visit: Payer: Self-pay

## 2022-07-11 ENCOUNTER — Encounter (HOSPITAL_COMMUNITY): Payer: Self-pay | Admitting: Emergency Medicine

## 2022-07-11 DIAGNOSIS — R55 Syncope and collapse: Secondary | ICD-10-CM | POA: Diagnosis present

## 2022-07-11 DIAGNOSIS — J45909 Unspecified asthma, uncomplicated: Secondary | ICD-10-CM | POA: Diagnosis not present

## 2022-07-11 DIAGNOSIS — R5383 Other fatigue: Secondary | ICD-10-CM | POA: Insufficient documentation

## 2022-07-11 DIAGNOSIS — D72829 Elevated white blood cell count, unspecified: Secondary | ICD-10-CM | POA: Insufficient documentation

## 2022-07-11 DIAGNOSIS — E871 Hypo-osmolality and hyponatremia: Secondary | ICD-10-CM | POA: Diagnosis not present

## 2022-07-11 LAB — URINALYSIS, ROUTINE W REFLEX MICROSCOPIC
Bilirubin Urine: NEGATIVE
Glucose, UA: NEGATIVE mg/dL
Ketones, ur: 5 mg/dL — AB
Nitrite: NEGATIVE
Protein, ur: NEGATIVE mg/dL
Specific Gravity, Urine: 1.008 (ref 1.005–1.030)
pH: 7 (ref 5.0–8.0)

## 2022-07-11 LAB — CBC
HCT: 38 % (ref 36.0–46.0)
Hemoglobin: 12.1 g/dL (ref 12.0–15.0)
MCH: 27.1 pg (ref 26.0–34.0)
MCHC: 31.8 g/dL (ref 30.0–36.0)
MCV: 85.2 fL (ref 80.0–100.0)
Platelets: 270 10*3/uL (ref 150–400)
RBC: 4.46 MIL/uL (ref 3.87–5.11)
RDW: 14.8 % (ref 11.5–15.5)
WBC: 12.8 10*3/uL — ABNORMAL HIGH (ref 4.0–10.5)
nRBC: 0 % (ref 0.0–0.2)

## 2022-07-11 LAB — BASIC METABOLIC PANEL
Anion gap: 10 (ref 5–15)
BUN: 11 mg/dL (ref 6–20)
CO2: 18 mmol/L — ABNORMAL LOW (ref 22–32)
Calcium: 9.1 mg/dL (ref 8.9–10.3)
Chloride: 106 mmol/L (ref 98–111)
Creatinine, Ser: 0.5 mg/dL (ref 0.44–1.00)
GFR, Estimated: >60 mL/min (ref >60)
Glucose, Bld: 71 mg/dL (ref 70–99)
Potassium: 3.5 mmol/L (ref 3.5–5.1)
Sodium: 134 mmol/L — ABNORMAL LOW (ref 135–145)

## 2022-07-11 LAB — TROPONIN I (HIGH SENSITIVITY): Troponin I (High Sensitivity): 6 ng/L (ref <18)

## 2022-07-11 MED ORDER — CEPHALEXIN 500 MG PO CAPS: 500.0000 mg | ORAL_CAPSULE | Freq: Three times a day (TID) | ORAL | 0 refills | Status: DC

## 2022-07-11 MED ORDER — SODIUM CHLORIDE 0.9 % IV BOLUS
1000.0000 mL | Freq: Once | INTRAVENOUS | Status: AC
  Administered 2022-07-11: 1000 mL via INTRAVENOUS

## 2022-07-11 NOTE — ED Triage Notes (Signed)
Pt is [redacted] weeks pregnant. Possible syncopal episode while working upstairs. Pt c/o feeling generally weak and lightheaded. Denies CP at present. Pt appears pale and weak upon arrival to ED. Responds to voice.

## 2022-07-11 NOTE — ED Notes (Signed)
Pt BP 88/47 at time of DC. Provider was notified and responded to continue on with DC.

## 2022-07-11 NOTE — ED Provider Notes (Signed)
[facility] EMERGENCY DEPARTMENT Provider Note   CSN: [id number] Arrival date & time: 07/11/22  1215     History  Chief Complaint  Patient presents with   Fatigue    Vickie Sutton is a 21 y.o. female with medical history of ADD, anxiety, asthma, panic attacks, PCOS, POTS, syncope and collapse, tachycardia induced syncope.  Patient presents to ED for evaluation of presyncope.  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  The patient states that she all of a sudden became lightheaded, dizzy and began having sensations of "palpitations" in her chest.  The patient states that at this time she believes that she passed out and was lowered to the ground by a colleague in the area.  The patient denies hitting her head.  Patient reports that she has a history of syncope, has been diagnosed with tachycardia induced syncope at [facility].  The patient was placed on a beta-blocker at this time however was taken off beta-blocker 19 weeks ago when she found out that she was pregnant.  The patient states that the amount of episodes of syncope and collapse she is experiencing now versus when she was on the beta-blocker are the same.  She reports no change in the frequency of these episodes while she is on a beta-blocker.  Patient states that prior to syncope she had a episode of chest pain which is located centrally, did not radiate and was not associated with shortness of breath.  Patient has been worked up by neurology as well as cardiology for this issue.  The patient denies any nausea, vomiting, fevers, abdominal pain, vaginal spotting, vaginal bleeding, vaginal discharge.  Patient last had OB appointment yesterday, is scheduled to have anatomy scan done on Friday.  Patient at the end of interview adds that she has chronic low blood pressure.  HPI     Home Medications Prior to Admission medications   Medication Sig Start Date  End Date Taking? Authorizing Provider  cephALEXin (KEFLEX) 500 MG capsule Take 1 capsule (500 mg total) by mouth 3 (three) times daily. 07/11/22  Yes Azucena Cecil, PA-C  albuterol (VENTOLIN HFA) 108 (90 Base) MCG/ACT inhaler Inhale 2 puffs into the lungs as needed for wheezing or shortness of breath.    [provider]  escitalopram (LEXAPRO) 20 MG tablet Take 20 mg by mouth daily. 03/31/22   [provider]  ondansetron (ZOFRAN-ODT) 4 MG disintegrating tablet Take 1 tablet (4 mg total) by mouth every 8 (eight) hours as needed for nausea or vomiting. Patient taking differently: Take 4 mg by mouth as needed for nausea or vomiting. 04/23/22   Janyth Pupa, DO  Prenatal Vit-Fe Fumarate-FA (PRENATAL MULTIVITAMIN) TABS tablet Take 1 tablet by mouth daily at 12 noon.    [provider]      Allergies    Patient has no known allergies.    Review of Systems   Review of Systems  Constitutional:  Negative for fever.  Respiratory:  Negative for shortness of breath.   Cardiovascular:  Positive for chest pain.  Gastrointestinal:  Negative for abdominal pain, nausea and vomiting.  Genitourinary:  Negative for vaginal bleeding and vaginal discharge.  Neurological:  Positive for syncope, weakness and light-headedness.  All other systems reviewed and are negative.   Physical Exam Updated Vital Signs BP 95/63   Pulse 68   Temp 98.4 F (36.9 C)   Resp 14  Ht '5\' 5"'$  (1.651 m)   Wt 68.5 kg   LMP 02/19/2022 (Exact Date)   SpO2 100%   BMI 25.13 kg/m  Physical Exam Vitals and nursing note reviewed.  Constitutional:      General: She is not in acute distress.    Appearance: Normal appearance. She is not ill-appearing, toxic-appearing or diaphoretic.  HENT:     Head: Normocephalic and atraumatic.     Nose: Nose normal. No congestion.     Mouth/Throat:     Mouth: Mucous membranes are moist.     Pharynx: No oropharyngeal exudate or posterior oropharyngeal erythema.   Eyes:     Extraocular Movements: Extraocular movements intact.     Conjunctiva/sclera: Conjunctivae normal.     Pupils: Pupils are equal, round, and reactive to light.  Cardiovascular:     Rate and Rhythm: Normal rate and regular rhythm.  Pulmonary:     Effort: Pulmonary effort is normal.     Breath sounds: Normal breath sounds.  Abdominal:     General: Abdomen is flat. Bowel sounds are normal.     Palpations: Abdomen is soft.     Tenderness: There is no abdominal tenderness.  Musculoskeletal:     Cervical back: Normal range of motion and neck supple. No tenderness.  Skin:    General: Skin is warm and dry.     Capillary Refill: Capillary refill takes less than 2 seconds.  Neurological:     Mental Status: She is alert and oriented to person, place, and time.     GCS: GCS eye subscore is 4. GCS verbal subscore is 5. GCS motor subscore is 6.     Cranial Nerves: Cranial nerves 2-12 are intact. No cranial nerve deficit.     Sensory: Sensation is intact. No sensory deficit.     Motor: Motor function is intact. No weakness.     Coordination: Coordination is intact. Heel to Shin Test normal.     ED Results / Procedures / Treatments   Labs (all labs ordered are listed, but only abnormal results are displayed) Labs Reviewed  BASIC METABOLIC PANEL - Abnormal; Notable for the following components:      Result Value   Sodium 134 (*)    CO2 18 (*)    All other components within normal limits  CBC - Abnormal; Notable for the following components:   WBC 12.8 (*)    All other components within normal limits  URINALYSIS, ROUTINE W REFLEX MICROSCOPIC - Abnormal; Notable for the following components:   APPearance HAZY (*)    Hgb urine dipstick SMALL (*)    Ketones, ur 5 (*)    Leukocytes,Ua LARGE (*)    Bacteria, UA RARE (*)    All other components within normal limits  URINE CULTURE  CBG MONITORING, ED  TROPONIN I (HIGH SENSITIVITY)    EKG EKG  Interpretation  Date/Time:  Wednesday July 11 2022 12:26:13 EDT Ventricular Rate:  99 PR Interval:  119 QRS Duration: 107 QT Interval:  364 QTC Calculation: 468 R Axis:   75 Text Interpretation: Sinus rhythm q waves noted inferiorly and laterally, no change compared to previous Confirmed by Dorie Rank [phone]) on 07/11/2022 12:30:52 PM  Radiology No results found.  Procedures Procedures   Medications Ordered in ED Medications  sodium chloride 0.9 % bolus 1,000 mL (0 mLs Intravenous Stopped 07/11/22 1449)    ED Course/ Medical Decision Making/ A&P  Medical Decision Making Amount and/or Complexity of Data Reviewed Labs: ordered.   21 year old female presents to the ED for evaluation.  Please see HPI for further details.  On examination the patient is afebrile and nontachycardic.  The patient lung sounds are clear bilaterally, she is not hypoxic on room air.  The patient's abdomen is soft and compressible, appears gravid consistent with 20-week pregnancy.  The patient neurological examination shows no focal neurodeficits.  The patient is hypotensive on the monitor however she states that she has a history of hypotension.    Patient reports that she has longstanding history of these near syncopal events.  The patient has been worked up by neurology, cardiology as well as [facility] cardiology team.  The patient was placed on metoprolol for suspected tachycardia induced syncope however the patient has stopped taking this medication due to pregnancy.  Patient worked up utilizing the following labs and imaging studies interpreted by me personally: - Troponin 6 - BMP with slightly decreased sodium to 134 treated with 1 L normal saline - CBC with slight leukocytosis of 12.8 over the patient is afebrile, nontachycardic.  The patient denies any feelings of malaise. - Urinalysis significant for small hemoglobin, ketones, large leukocytes and  bacteria.  The patient urine culture has been sent off.  The patient endorses slight dysuria beginning 3 days ago.  The patient will be sent home on Keflex 500 mg 3 times daily for the next 4 days and advised to follow-up with her OB/GYN. - EKG sinus rhythm, nonischemic  After liter of fluid was given to the patient she states that she feels better.  The patient will be discharged home with short course of antibiotics for suspected UTI and advised to follow-up with OB/GYN for further management.  The patient states that she has OB appointment on Friday for anatomy scan.  Return precautions were given and the patient voiced understanding.  The patient had all of her questions answered to her satisfaction.  The patient will be discharged home in stable condition.  Final Clinical Impression(s) / ED Diagnoses Final diagnoses:  Near syncope    Rx / DC Orders ED Discharge Orders          Ordered    cephALEXin (KEFLEX) 500 MG capsule  3 times daily        07/11/22 1501              Azucena Cecil, PA-C 07/11/22 1511    Dorie Rank, MD 07/14/22 [phone]

## 2022-07-11 NOTE — Discharge Instructions (Addendum)
Please return to the ED with any new or worsening symptoms Please begin taking antibiotics for suspected UTI.  You will take these 3 times per day for the next 4 days. Please follow-up with your OB on Friday and discuss whether or not they would like for you to stay on antibiotics for suspected UTI Please read the attached informational guide concerning near syncope in adults

## 2022-07-12 ENCOUNTER — Telehealth: Payer: Self-pay

## 2022-07-12 ENCOUNTER — Other Ambulatory Visit: Payer: Self-pay | Admitting: Obstetrics & Gynecology

## 2022-07-12 ENCOUNTER — Telehealth: Payer: Self-pay | Admitting: Cardiology

## 2022-07-12 ENCOUNTER — Encounter (HOSPITAL_COMMUNITY): Payer: Medicaid Other

## 2022-07-12 DIAGNOSIS — Z363 Encounter for antenatal screening for malformations: Secondary | ICD-10-CM

## 2022-07-12 LAB — HERPES SIMPLEX VIRUS CULTURE

## 2022-07-12 LAB — CBG MONITORING, ED: Glucose-Capillary: 88 mg/dL (ref 70–99)

## 2022-07-12 NOTE — Telephone Encounter (Signed)
Left the pt a message to call the office back to endorse recommendations per Dr. Johney Frame.

## 2022-07-12 NOTE — Telephone Encounter (Signed)
Patient called and stated that she wanted the office to know that she had two syncopal episodes yesterday while at work.  They checked the baby and said she was okay. Patient has an appointment tomorrow for an Ultrasound and a provider visit. Patient stated that if you needed any additional information, that it should be in her patient record because she was seen at a [facility].

## 2022-07-12 NOTE — Telephone Encounter (Signed)
Completely agree with your plan. Likely more orthostatic in the setting of infection. Completely agree with hydration, small frequent meals, electrolyte replacement drinks, compression therapy and treating UTI. Glad she is seeing OBGYN tomorrow too.

## 2022-07-12 NOTE — Telephone Encounter (Signed)
This is a Dr. Johney Frame Cardio OB pt, last seen at [facility] on 7/11.  Pt calling to report to Dr. Johney Frame that she had another vasovagal episode yesterday at work, and went to the ED for this.   Pt states she started to feel lightheaded, tunnel vision, then felt pre-syncopal.  Her coworkers said she was "out of it."  Pt then went to [facility] ED for evaluation of this and she had the full work-up done.  Noted in epic she had a EKG showing sinus tach at HR-99.  Troponins negative.  Her urine did come back abnormal and she was started on Keflex.  Culture was sent off.  All other labs were within normal parameters. Her pressures were in the low 53'Z systolic, but pt reports that's been her baseline since gastric bypass.  She did report her coworkers said her pressures were lower when they found her, being in the high 76'B systolic. Pt received a liter of fluid while there.  All notes visible in epic.    Pt states she is now home resting comfortably.  She states she will follow-up with her OBGYN on tomorrow 7/21.  Asked the pt if she is wearing her compressions during the day.  She reports no. Advised the pt to start wearing her compressions during the day.  Advised her to start eating small frequent meals, really increase her hydration, take her prescribed antibiotics for UTI, avoid bearing down hard, change positions super slowly, drink a gatorade zero during the day, and start monitoring her pressures at home if capable.  Advised her to follow-up with her OBGYN as scheduled for tomorrow.   Informed the pt that Dr. Johney Frame is out of the office today, but I will make her aware of this when she returns to the office.  Pt aware I will follow-up with her accordingly thereafter with any new recommendations.  Pt verbalized understanding and agrees with this plan.

## 2022-07-12 NOTE — Telephone Encounter (Signed)
Pt aware of recommendations per Dr. Johney Frame. She is aware to take her ABT for UTI, increase hydration, electrolyte replacement, compressions, eat small frequent meals, and follow-up with her OBGYN as scheduled for tomorrow.  Pt verbalized understanding and agrees with this plan.

## 2022-07-12 NOTE — Telephone Encounter (Signed)
Pt called stating she was told to notify Dr. Johney Frame if she had any more vasovagal syncope episodes. She states that she had two episodes yesterday. One before she went to the ED and another one while in the ED. She states that they ran some additional tests that should be in epic and she is concerned with the results. Please advise.

## 2022-07-13 ENCOUNTER — Ambulatory Visit (INDEPENDENT_AMBULATORY_CARE_PROVIDER_SITE_OTHER): Payer: Medicaid Other | Admitting: Obstetrics & Gynecology

## 2022-07-13 ENCOUNTER — Encounter: Payer: Self-pay | Admitting: Obstetrics & Gynecology

## 2022-07-13 ENCOUNTER — Ambulatory Visit (INDEPENDENT_AMBULATORY_CARE_PROVIDER_SITE_OTHER): Payer: Medicaid Other

## 2022-07-13 ENCOUNTER — Other Ambulatory Visit (HOSPITAL_COMMUNITY)
Admission: RE | Admit: 2022-07-13 | Discharge: 2022-07-13 | Disposition: A | Discharge status: Home / Self Care | Payer: Medicaid Other | Source: Ambulatory Visit | Attending: Obstetrics & Gynecology | Admitting: Obstetrics & Gynecology

## 2022-07-13 VITALS — BP 83/51 | HR 66 | Wt 152.0 lb

## 2022-07-13 DIAGNOSIS — N898 Other specified noninflammatory disorders of vagina: Secondary | ICD-10-CM

## 2022-07-13 DIAGNOSIS — Z363 Encounter for antenatal screening for malformations: Secondary | ICD-10-CM

## 2022-07-13 DIAGNOSIS — Z3402 Encounter for supervision of normal first pregnancy, second trimester: Secondary | ICD-10-CM

## 2022-07-13 DIAGNOSIS — G90A Postural orthostatic tachycardia syndrome (POTS): Secondary | ICD-10-CM

## 2022-07-13 DIAGNOSIS — Z3A19 19 weeks gestation of pregnancy: Secondary | ICD-10-CM

## 2022-07-13 DIAGNOSIS — O219 Vomiting of pregnancy, unspecified: Secondary | ICD-10-CM

## 2022-07-13 DIAGNOSIS — Z3480 Encounter for supervision of other normal pregnancy, unspecified trimester: Secondary | ICD-10-CM

## 2022-07-13 DIAGNOSIS — Z3400 Encounter for supervision of normal first pregnancy, unspecified trimester: Secondary | ICD-10-CM

## 2022-07-13 LAB — URINE CULTURE

## 2022-07-13 MED ORDER — SCOPOLAMINE 1 MG/3DAYS TD PT72: 1.0000 | MEDICATED_PATCH | TRANSDERMAL | 1 refills | Status: AC

## 2022-07-13 NOTE — Progress Notes (Signed)
[place] 19+5 wks,breech,anterior placenta gr 0,cx 3.3 cm,SVP of fluid 4 cm,FHR 138 bpm,normal ovaries,EFW 349 g 80%,anatomy complete,no obvious abnormalities

## 2022-07-13 NOTE — Progress Notes (Signed)
LOW-RISK PREGNANCY VISIT Patient name: Vickie Sutton MRN [id number]  Date of birth: Jun 13, 2001 Chief Complaint:   Routine Prenatal Visit  History of Present Illness:   Vickie Sutton is a 21 y.o. G71P1001 female at 68w5dwith an Estimated Date of Delivery: 12/02/22 being seen today for ongoing management of a low-risk pregnancy.   -h.o gastric bypass -Anxiety- on Lexapro -POTS- followed by cardiology and had episode of syncope at work  Pt notes increased yellow discharge.  Tried self-swab but could not run specimen.  Denies pelvic or abdominal pain.  Going on cruise and concerned about nausea/vomiting.      05/24/2022    9:30 AM 06/16/2021    9:44 AM 08/08/2020   10:33 AM  Depression screen PHQ 2/9  Decreased Interest 0 1 0  Down, Depressed, Hopeless 0 1 0  PHQ - 2 Score 0 2 0  Altered sleeping 0 0 0  Tired, decreased energy '2 2 2  '$ Change in appetite 0 0 0  Feeling bad or failure about yourself  0 0 0  Trouble concentrating 0 0 0  Moving slowly or fidgety/restless 0 0 0  Suicidal thoughts 0 0 0  PHQ-9 Score '2 4 2    '$ Today she reports vaginal irritation. Contractions: Not present. Vag. Bleeding: None.  Movement: Present. denies leaking of fluid. Review of Systems:   Pertinent items are noted in HPI Denies headaches, visual changes, shortness of breath, chest pain, abdominal pain, severe nausea/vomiting, or problems with urination or bowel movements unless otherwise stated above. Pertinent History Reviewed:  Reviewed past medical,surgical, social, obstetrical and family history.  Reviewed problem list, medications and allergies.  Physical Assessment:   Vitals:   07/13/22 1137  BP: (!) 83/51  Pulse: 66  Weight: 152 lb (68.9 kg)  Body mass index is 25.29 kg/m.        Physical Examination:   General appearance: Well appearing, and in no distress  Mental status: Alert, oriented to person, place, and time  Skin: Warm & dry  Respiratory: Normal respiratory effort, no  distress  Abdomen: Soft, gravid, nontender  Pelvic:  normal external genitalia, vagina: White to yellow discharge noted, cervix visualized closed. SVE not completed          Extremities: Edema: None  Psych:  mood and affect appropriate  Fetal Status:     Movement: Present   [place]19+5 wks,breech,anterior placenta gr 0,cx 3.3 cm,SVP of fluid 4 cm,FHR 138 bpm,normal ovaries,EFW 349 g 80%,anatomy complete,no obvious abnormalities  Chaperone:  pt declined     No results found for this or any previous visit (from the past 24 hour(s)).   Assessment & Plan:  1) Low-risk pregnancy G2P1001 at 113w5dith an Estimated Date of Delivery: 12/02/22   2) Vaginal discharge/irritation -vaginitis panel today, further management pending results  3) POTS -as per cardiology recommendation- encouraged increased hydration, which it sounds like she is not doing  -normal anatomy scan today, continue routine OB care   Meds:  Meds ordered this encounter  Medications   scopolamine (TRANSDERM-SCOP) 1 MG/3DAYS    Sig: Place 1 patch (1.5 mg total) onto the skin every 3 (three) days for 7 days.    Dispense:  3 patch    Refill:  1   Labs/procedures today: vaginitis panel, anatomy scan  Plan:  Continue routine obstetrical care  Next visit: prefers in person    Reviewed: Preterm labor symptoms and general obstetric precautions including but not limited to vaginal bleeding, contractions, leaking  of fluid and fetal movement were reviewed in detail with the patient.  All questions were answered. Pt has home bp cuff. Check bp weekly, let us know if >140/90.   Follow-up: Return in about 4 weeks (around 08/10/2022) for [place] visit.  No orders of the defined types were placed in this encounter.   Janyth Pupa, DO Attending [place], [facility] for [employer], [place]

## 2022-07-16 ENCOUNTER — Other Ambulatory Visit: Payer: Self-pay | Admitting: Obstetrics & Gynecology

## 2022-07-16 ENCOUNTER — Encounter (HOSPITAL_COMMUNITY): Payer: Medicaid Other

## 2022-07-16 DIAGNOSIS — N898 Other specified noninflammatory disorders of vagina: Secondary | ICD-10-CM

## 2022-07-16 LAB — CERVICOVAGINAL ANCILLARY ONLY
Bacterial Vaginitis (gardnerella): NEGATIVE
Candida Glabrata: NEGATIVE
Candida Vaginitis: POSITIVE — AB
Chlamydia: NEGATIVE
Comment: NEGATIVE
Comment: NEGATIVE
Comment: NEGATIVE
Comment: NEGATIVE
Comment: NEGATIVE
Comment: NORMAL
Neisseria Gonorrhea: NEGATIVE
Trichomonas: NEGATIVE

## 2022-07-16 MED ORDER — TERCONAZOLE 0.4 % VA CREA: 1.0000 | TOPICAL_CREAM | Freq: Every day | VAGINAL | 0 refills | Status: AC

## 2022-07-16 NOTE — Progress Notes (Signed)
Rx sent for yeast infection  Janyth Pupa, DO Attending [place], [facility] for [facility], [place]

## 2022-07-17 ENCOUNTER — Ambulatory Visit (INDEPENDENT_AMBULATORY_CARE_PROVIDER_SITE_OTHER): Payer: Medicaid Other | Admitting: Clinical

## 2022-07-17 DIAGNOSIS — F419 Anxiety disorder, unspecified: Secondary | ICD-10-CM | POA: Diagnosis not present

## 2022-07-17 NOTE — Patient Instructions (Signed)
[facility] [address] [place], [place] [address] [phone] (main office) [phone] (Zailyn Thoennes's office)     BRAINSTORMING  Develop a Plan Goals: Provide a way to start conversation about your new life with a baby Assist parents in recognizing and using resources within their reach Help pave the way before birth for an easier period of transition afterwards.  Make a list of the following information to keep in a central location: Full name of Mom and Partner: _____________________________________________ Baby's full name and Date of Birth: ___________________________________________ Home Address: ___________________________________________________________ ________________________________________________________________________ Home Phone: ____________________________________________________________ Parents' cell numbers: _____________________________________________________ ________________________________________________________________________ Name and contact info for OB: ______________________________________________ Name and contact info for Pediatrician:________________________________________ Contact info for Lactation Consultants: ________________________________________  REST and SLEEP *You each need at least 4-5 hours of uninterrupted sleep every day. Write specific names and contact information.* How are you going to rest in the postpartum period? While partner's home? When partner returns to work? When you both return to work? Where will your baby sleep? Who is available to help during the day? Evening? Night? Who could move in for a period to help support you? What are some ideas to help you get enough  sleep? __________________________________________________________________________________________________________________________________________________________________________________________________________________________________________ NUTRITIOUS FOOD AND DRINK *Plan for meals before your baby is born so you can have healthy food to eat during the immediate postpartum period.* Who will look after breakfast? Lunch? Dinner? List names and contact information. Brainstorm quick, healthy ideas for each meal. What can you do before baby is born to prepare meals for the postpartum period? How can others help you with meals? Which grocery stores provide online shopping and delivery? Which restaurants offer take-out or delivery options? ______________________________________________________________________________________________________________________________________________________________________________________________________________________________________________________________________________________________________________________________________________________________________________________________________  CARE FOR MOM *It's important that mom is cared for and pampered in the postpartum period. Remember, the most important ways new mothers need care are: sleep, nutrition, gentle exercise, and time off.* Who can come take care of mom during this period? Make a list of people with their contact information. List some activities that make you feel cared for, rested, and energized? Who can make sure you have opportunities to do these things? Does mom have a space of her very own within your home that's just for her? Make a "Mama Cave" where she can be comfortable, rest, and renew herself  daily. ______________________________________________________________________________________________________________________________________________________________________________________________________________________________________________________________________________________________________________________________________________________________________________________________________    CARE FOR AND FEEDING BABY *Knowledgeable and encouraging people will offer the best support with regard to feeding your baby.* Educate yourself and choose the best feeding option for your baby. Make a list of people who will guide, support, and be a resource for you as your care for and feed your baby. (Friends that have breastfed or are currently breastfeeding, lactation consultants, breastfeeding support groups, etc.) Consider a postpartum doula. (These websites can give you information: dona.org & padanc.org) Seek out local breastfeeding resources like the breastfeeding support group at [facility] or [employer]. ______________________________________________________________________________________________________________________________________________________________________________________________________________________________________________________________________________________________________________________________________________________________________________________________________  CHORES AND ERRANDS Who can help with a thorough cleaning before baby is born? Make a list of people who will help with housekeeping and chores, like laundry, light cleaning, dishes, bathrooms, etc. Who can run some errands for you? What can you do to make sure you are stocked with basic supplies before baby is born? Who is going to do the  shopping? ______________________________________________________________________________________________________________________________________________________________________________________________________________________________________________________________________________________________________________________________________________________________________________________________________     Family Adjustment *Nurture yourselves.it helps parents be more loving and allows for better bonding with their child.* What sorts of things do you and partner enjoy   doing together? Which activities help you to connect and strengthen your relationship? Make a list of those things. Make a list of people whom you trust to care for your baby so you can have some time together as a couple. What types of things help partner feel connected to Mom? Make a list. What needs will partner have in order to bond with baby? Other children? Who will care for them when you go into labor and while you are in the hospital? Think about what the needs of your older children might be. Who can help you meet those needs? In what ways are you helping them prepare for bringing baby home? List some specific strategies you have for family adjustment. _______________________________________________________________________________________________________________________________________________________________________________________________________________________________________________________________________________________________________________________________________________  SUPPORT *Someone who can empathize with experiences normalizes your problems and makes them more bearable.* Make a list of other friends, neighbors, and/or co-workers you know with infants (and small children, if applicable) with whom you can connect. Make a list of local or online support groups, mom groups, etc. in which you can be  involved. ______________________________________________________________________________________________________________________________________________________________________________________________________________________________________________________________________________________________________________________________________________________________________________________________________  Childcare Plans Investigate and plan for childcare if mom is returning to work. Talk about mom's concerns about her transition back to work. Talk about partner's concerns regarding this transition.  Mental Health *Your mental health is one of the highest priorities for a pregnant or postpartum mom.* 1 in 5 women experience anxiety and/or depression from the time of conception through the first year after birth. Postpartum Mood Disorders are the #1 complication of pregnancy and childbirth and the suffering experienced by these mothers is not necessary! These illnesses are temporary and respond well to treatment, which often includes self-care, social support, talk therapy, and medication when needed. Women experiencing anxiety and depression often say things like: "I'm supposed to be happy.why do I feel so sad?", "Why can't I snap out of it?", "I'm having thoughts that scare me." There is no need to be embarrassed if you are feeling these symptoms: Overwhelmed, anxious, angry, sad, guilty, irritable, hopeless, exhausted but can't sleep You are NOT alone. You are NOT to blame. With help, you WILL be well. Where can I find help? Medical professionals such as your OB, midwife, gynecologist, family practitioner, primary care provider, pediatrician, or mental health providers; [facility] support groups: Feelings After Birth, Breastfeeding Support Group, Baby and Me Group, and Fit 4 Two exercise classes. You have permission to ask for help. It will confirm your feelings, validate your experiences,  share/learn coping strategies, and gain support and encouragement as you heal. You are important! BRAINSTORM Make a list of local resources, including resources for mom and for partner. Identify support groups. Identify people to call late at night - include names and contact info. Talk with partner about perinatal mood and anxiety disorders. Talk with your OB, midwife, and doula about baby blues and about perinatal mood and anxiety disorders. Talk with your pediatrician about perinatal mood and anxiety disorders.   Support & Sanity Savers   What do you really need?  Basics In preparing for a new baby, many expectant parents spend hours shopping for baby clothes, decorating the nursery, and deciding which car seat to buy. Yet most don't think much about what the reality of parenting a newborn will be like, and what they need to make it through that. So, here is the advice of experienced parents. We know you'll read this, and think "they're exaggerating, I don't really need that." Just trust us on these, OK? Plan for all of   this, and if it turns out you don't need it, come back and teach us how you did it!  Must-Haves (Once baby's survival needs are met, make sure you attend to your own survival needs!) Sleep An average newborn sleeps 16-18 hours per day, over 6-7 sleep periods, rarely more than three hours at a time. It is normal and healthy for a newborn to wake throughout the night... but really hard on parents!! Naps. Prioritize sleep above any responsibilities like: cleaning house, visiting friends, running errands, etc.  Sleep whenever baby sleeps. If you can't nap, at least have restful times when baby eats. The more rest you get, the more patient you will be, the more emotionally stable, and better at solving problems.  Food You may not have realized it would be difficult to eat when you have a newborn. Yet, when we talk to countless new parents, they say things like "it may be 2:00 pm  when I realize I haven't had breakfast yet." Or "every time we sit down to dinner, baby needs to eat, and my food gets cold, so I don't bother to eat it." Finger food. Before your baby is born, stock up with one months' worth of food that: 1) you can eat with one hand while holding a baby, 2) doesn't need to be prepped, 3) is good hot or cold, 4) doesn't spoil when left out for a few hours, and 5) you like to eat. Think about: nuts, dried fruit, Clif bars, pretzels, jerky, gogurt, baby carrots, apples, bananas, crackers, cheez-n-crackers, string cheese, hot pockets or frozen burritos to microwave, garden burgers and breakfast pastries to put in the toaster, yogurt drinks, etc. Restaurant Menus. Make lists of your favorite restaurants & menu items. When family/friends want to help, you can give specific information without much thought. They can either bring you the food or send gift cards for just the right meals. Freezer Meals.  Take some time to make a few meals to put in the freezer ahead of time.  Easy to freeze meals can be anything such as soup, lasagna, chicken pie, or spaghetti sauce. Set up a Meal Schedule.  Ask friends and family to sign up to bring you meals during the first few weeks of being home. (It can be passed around at baby showers!) You have no idea how helpful this will be until you are in the throes of parenting.  www.takethemameal.com is a great website to check out. Emotional Support Know who to call when you're stressed out. Parenting a newborn is very challenging work. There are times when it totally overwhelms your normal coping abilities. EVERY NEW PARENT NEEDS TO HAVE A PLAN FOR WHO TO CALL WHEN THEY JUST CAN'T COPE ANY MORE. (And it has to be someone other than the baby's other parent!) Before your baby is born, come up with at least one person you can call for support - write their phone number down and post it on the refrigerator. Anxiety & Sadness. Baby blues are normal after  pregnancy; however, there are more severe types of anxiety & sadness which can occur and should not be ignored.  They are always treatable, but you have to take the first step by reaching out for help. [facility] offers a "Mom Talk" group which meets every Tuesday from 10 am - 11 am.  This group is for new moms who need support and connection after their babies are born.  Call [phone].  Really, Really Helpful (Plan for them!   Make sure these happen often!!) Physical Support with Taking Care of Yourselves Asking friends and family. Before your baby is born, set up a schedule of people who can come and visit and help out (or ask a friend to schedule for you). Any time someone says "let me know what I can do to help," sign them up for a day. When they get there, their job is not to take care of the baby (that's your job and your joy). Their job is to take care of you!  Postpartum doulas. If you don't have anyone you can call on for support, look into postpartum doulas:  professionals at helping parents with caring for baby, caring for themselves, getting breastfeeding started, and helping with household tasks. www.padanc.org is a helpful website for learning about doulas in our area. Peer Support / Parent Groups Why: One of the greatest ideas for new parents is to be around other new parents. Parent groups give you a chance to share and listen to others who are going through the same season of life, get a sense of what is normal infant development by watching several babies learn and grow, share your stories of triumph and struggles with empathetic ears, and forgive your own mistakes when you realize all parents are learning by trial and error. Where to find: There are many places you can meet other new parents throughout our community.  [facility] offers the following classes for new moms and their little ones:  Baby and Me (Birth to Crawling) and Breastfeeding Support Group. Go to  www.conehealthybaby.com or call [phone] for more information. Time for your Relationship It's easy to get so caught up in meeting baby's immediate needs that it's hard to find time to connect with your partner, and meet the needs of your relationship. It's also easy to forget what "quality time with your partner" actually looks like. If you take your baby on a date, you'd be amazed how much of your couple time is spent feeding the baby, diapering the baby, admiring the baby, and talking about the baby. Dating: Try to take time for just the two of you. Babysitter tip: Sometimes when moms are breastfeeding a newborn, they find it hard to figure out how to schedule outings around baby's unpredictable feeding schedules. Have the babysitter come for a three hour period. When she comes over, if baby has just eaten, you can leave right away, and come back in two hours. If baby hasn't fed recently, you start the date at home. Once baby gets hungry and gets a good feeding in, you can head out for the rest of your date time. Date Nights at Home: If you can't get out, at least set aside one evening a week to prioritize your relationship: whenever baby dozes off or doesn't have any immediate needs, spend a little time focusing on each other. Potential conflicts: The main relationship conflicts that come up for new parents are: issues related to sexuality, financial stresses, a feeling of an unfair division of household tasks, and conflicts in parenting styles. The more you can work on these issues before baby arrives, the better!  Fun and Frills (Don't forget these. and don't feel guilty for indulging in them!) Everyone has something in life that is a fun little treat that they do just for themselves. It may be: reading the morning paper, or going for a daily jog, or having coffee with a friend once a week, or going to a movie on Friday nights,   or fine chocolates, or bubble baths, or curling up with a good  book. Unless you do fun things for yourself every now and then, it's hard to have the energy for fun with your baby. Whatever your "special" treats are, make sure you find a way to continue to indulge in them after your baby is born. These special moments can recharge you, and allow you to return to baby with a new joy   PERINATAL MOOD DISORDERS: [place]   _________________________________________Emergency and Crisis Resources If you are an imminent risk to self or others, are experiencing intense personal distress, and/or have noticed significant changes in activities of daily living, call:  [facility]: [phone]  [address] [place], [place], [address] Mobile Crisis: [place]: 988 Or visit the following crisis centers: Local Emergency Departments [facility]: [address], [place]. Hours: 8:30AM-5PM. Insurance Accepted: Medicaid, Medicare, and Uninsured.  [facility]:  [address], [place]  Mon-Friday 8am-3pm, [phone]                                                                                  ___________ Non-Crisis Resources To identify specific providers that are covered by your insurance, contact your insurance company or local agencies:  [place]: [phone] [facility] and [employer]: [place]: [phone] Postpartum Support International- Warm-line: (239)374-1098                                                      __Outpatient Therapy and Medication Management   Providers:  [facility]: [phone] Hours: 9AM-5PM  Insurance Accepted: Alben Spittle, Shane Crutch, [place], [place] [facility]): [phone] Hours: 8AM-5:30PM  nsurance Accepted: All insurances EXCEPT AARP, [place],  [place], and [place]: [phone] Hours: 8AM-8PM Insurance Accepted: Cristal Ford, Freddrick March, [place], Medicare, [phone] [facility]: [phone] Hours: 8:30AM-7PM Insurance Accepted: Cristal Ford, Medicaid, Medicare, Tricare, [employer] Counseling:  [place] Accepted:  Holland Falling, Lorella Nimrod, [employer], [place]: [phone] Hours: 9AM-5:30PM Insurance Accepted: Alben Spittle, Charlotte Crumb, and Medicaid, Medicare, [facility]:  [phone] Hours: 9am-5pm Insurance Accepted: BCBS; they do not accept Medicaid/Medicare [facility] [place]: [phone] Hours: 9am-9pm Insurance Accepted: All major insurance including Medicaid and Medicare [facility]: [phone] Hours: [place] Accepted: All insurances EXCEPT Medicaid and Medicare. [facility]: [phone]   ____________  Parenting [place]: [phone] [place]:  [place]: (support for children in the NICU and/or with special needs), [phone]   ___________                                                                 Mental Health Support Groups Mental Health Association: [phone]    _____________                                                                                  Online Resources Postpartum Support International: http://jones-berg.com/  800-944-4PPD 2Moms Supporting Moms:  www.momssupportingmoms.net   Coping with Panic Attacks   What is a panic attack?  You may have had a panic attack if you experienced four or more of the symptoms listed below coming on abruptly and peaking in about 10 minutes.   Panic Symptoms    Pounding heart   Sweating   Trembling or shaking   Shortness of breath   Feeling of choking   Chest pain   Nausea or abdominal distress     Feeling dizzy, unsteady, lightheaded, or faint   Feelings of unreality or being detached from yourself   Fear of losing control or going crazy   Fear of dying   Numbness or tingling   Chills or hot flashes      Panic attacks are sometimes accompanied by avoidance of certain places or situations. These are often situations that would be difficult to escape from or in which help might not be available. Examples might include crowded shopping malls, public transportation, restaurants, or driving.   Why do panic attacks occur?   Panic attacks are the body's alarm system gone awry. All of [place] have a built-in alarm system, powered by adrenaline, which increases our heart rate, breathing, and blood flow in response to danger. Ordinarily, this 'danger response system' works well. In some people, however, the response is either out of proportion to whatever stress is going on, or may come out of the blue without any stress at all.   For example, if you are walking in the woods and see a bear coming your way, a variety of changes occur in your body to prepare you to either fight the danger or flee from the situation. Your heart rate will increase to get more blood flow around your body, your breathing rate will quicken so that more oxygen is available, and your muscles will tighten in order to be ready to fight or run. You may feel nauseated as blood flow leaves your stomach area and moves into your limbs. These bodily changes are all essential to helping you survive the dangerous situation. After the danger has passed, your body functions will begin to go back to normal. This is because your body also has a system for "recovering" by bringing your body back down to a normal state when the danger is over.   As you  can see, the emergency response  system is adaptive when there is, in fact, a "true" or "real" danger (e.g., bear). However, sometimes people find that their emergency response system is triggered in "everyday" situations where there really is no true physical danger (e.g., in a meeting, in the grocery store, while driving in normal traffic, etc.).   What triggers a panic attack?  Sometimes particularly stressful situations can trigger a panic attack. For example, an argument with your spouse or stressors at work can cause a stress response (activating the emergency response system) because you perceive it as threatening or overwhelming, even if there is no direct risk to your survival.  Sometimes panic attacks don't seem to be triggered by anything in particular- they may "come out of the blue". Somehow, the natural "fight or flight" emergency response system has gotten activated when there is no real danger. Why does the body go into "emergency mode" when there is no real danger?   Often, people with panic attacks are frightened or alarmed by the physical sensations of the emergency response system. First, unexpected physical sensations are experienced (tightness in your chest or some shortness of breath). This then leads to feeling fearful or alarmed by these symptoms ("Something's wrong!", "Am I having a heart attack?", "Am I going to faint?") The mind perceives that there is a danger even though no real danger exists. This, in turn, activates the emergency response system ("fight or flight"), leading to a "full blown" panic attack. In summary, panic attacks occur when we misinterpret physical symptoms as signs of impending death, craziness, loss of control, embarrassment, or fear of fear. Sometimes you may be aware of thoughts of danger that activate the emergency response system (for example, thinking "I'm having a heart attack" when you feel chest pressure or increased heart rate). At other times, however, you may not be aware of such  thoughts. After several incidences of being afraid of physical sensations, anxiety and panic can occur in response to the initial sensations without conscious thoughts of danger. Instead, you just feel afraid or alarmed. In other words, the panic or fear may seem to occur "automatically" without you consciously telling yourself anything.   After having had one or more panic attacks, you may also become more focused on what is going on inside your body. You may scan your body and be more vigilant about noticing any symptoms that might signal the start of a panic attack. This makes it easier for panic attacks to happen again because you pick up on sensations you might otherwise not have noticed, and misinterpret them as something dangerous. A panic attack may then result.      How do I cope with panic attacks?  An important part of overcoming panic attacks involves re-interpreting your body's physical reactions and teaching yourself ways to decrease the physical arousal. This can be done through practicing the cognitive and behavioral interventions below.   Research has found that over half of people who have panic attacks show some signs of hyperventilation or overbreathing. This can produce initial sensations that alarm you and lead to a panic attack. Overbreathing can also develop as part of the panic attack and make the symptoms worse. When people hyperventilate, certain blood vessels in the body become narrower. In particular, the brain may get slightly less oxygen. This can lead to the symptoms of dizziness, confusion, and lightheadedness that often occur during panic attacks. Other parts of the body may also get a bit less  oxygen, which may lead to numbness or tingling in the hands or feet or the sensation of cold, clammy hands. It also may lead the heart to pump harder. Although these symptoms may be frightening and feel unpleasant, it is important to remember that hyperventilating is not dangerous.  However, you can help overcome the unpleasantness of overbreathing by practicing Breathing Retraining.   Practice this basic technique three times a day, every day:   Inhale. With your shoulders relaxed, inhale as slowly and deeply as you can while you count to six. If you can, use your diaphragm to fill your lungs with air.   Hold. Keep the air in your lungs as you slowly count to four.   Exhale. Slowly breath out as you count to six.   Repeat. Do the inhale-hold-exhale cycle several times. Each time you do it, exhale for longer counts.  Like any new skill, Breathing Retraining requires practice. Try practicing this skill twice a day for several minutes. Initially, do not try this technique in specific situations or when you become frightened or have a panic attack. Begin by practicing in a quiet environment to build up your skill level so that you can later use it in time of "emergency."   2. Decreasing Avoidance  Regardless of whether you can identify why you began having panic attacks or whether they seemed to come out of the blue, the places where you began having panic attacks often can become triggers themselves. It is not uncommon for individuals to begin to avoid the places where they have had panic attacks. Over time, the individual may begin to avoid more and more places, thereby decreasing their activities and often negatively impacting their quality of life. To break the cycle of avoidance, it is important to first identify the places or situations that are being avoided, and then to do some "relearning."  To begin this intervention, first create a list of locations or situations that you tend to avoid. Then choose an avoided location or situation that you would like to target first. Now develop an "exposure hierarchy" for this situation or location. An "exposure hierarchy" is a list of actions that make you feel anxious in this situation. Order these actions from least to most  anxiety-producing. It is often helpful to have the first item on your hierarchy involve thinking or imagining part of the feared/avoided situation.   Here is an example of an exposure hierarchy for decreasing avoidance of the grocery store. Note how it is ordered from the least amount of anxiety (at the top) to the most anxiety (at the bottom):   Think about going to the grocery store alone.   Go to the grocery store with a friend or family member.   Go to the grocery store alone to pick up a few small items (5-10 minutes in the store).   Shopping for 10-20 minutes in the store alone.   Doing the shopping for the week by myself (20-30 minutes in the store).   Your homework is to "expose" yourself to the lowest item on your hierarchy and use your breathing relaxation and coping statements (see below) to help you remain in the situation. Practice this several times during the upcoming week. Once you have mastered each item with minimal anxiety, move on to the next higher action on your list.   Cognitive Interventions  Identify your negative self-talk Anxious thoughts can increase anxiety symptoms and panic. The first step in changing anxious thinking is to  identify your own negative, alarming self-talk. Some common alarming thoughts:  I'm having a heart attack.            I must be going crazy. I think I'm dying. People will think I'm crazy. I'm going to pass our.  Oh no- here it comes.  I can't stand this.  I've got to get out of here!  2. Use positive coping statements Changing or disrupting a pattern of anxious thoughts by replacing them with more calming or supportive statements can help to divert a panic attack. Some common helpful coping statements:  This is not an emergency.  I don't like feeling this way, but I can accept it.  I can feel like this and still be okay.  This has happened before, and I was okay. I'll be okay this time, too.  I can be anxious and still deal with this  situation.

## 2022-07-18 NOTE — BH Specialist Note (Signed)
Integrated Behavioral Health via Telemedicine Visit  08/01/2022 Vickie Sutton [id number]  Number of Integrated Behavioral Health Clinician visits: 2- Second Visit  Session Start time: 8115   Session End time: 7262  Total time in minutes: 18   Referring Provider: Christin Fudge, CNM Patient/Family location: [name] Provider location: [facility] [employer] [facility]  All persons participating in visit: [name] and [place]   Types of Service: Individual psychotherapy and Video visit  I connected with [name] and/or [name]  n/a  via  Telephone or Video Enabled Telemedicine Application  (Video is Caregility application) and verified that I am speaking with the correct person using two identifiers. Discussed confidentiality: Yes   I discussed the limitations of telemedicine and the availability of in person appointments.  Discussed there is a possibility of technology failure and discussed alternative modes of communication if that failure occurs.  I discussed that engaging in this telemedicine visit, they consent to the provision of behavioral healthcare and the services will be billed under their insurance.  Patient and/or legal guardian expressed understanding and consented to Telemedicine visit: Yes   Presenting Concerns: Patient and/or family reports the following symptoms/concerns: Irritability and low back pain while working 12-hour shifts; sleep improving with pregnancy pillow; relaxation breathing and vacation time (spa massage and sound of ocean) both helping to reduce stress Duration of problem: Ongoing; Severity of problem: moderate  Patient and/or Family's Strengths/Protective Factors: Social connections, Concrete supports in place (healthy food, safe environments, etc.), Sense of purpose, and Physical Health (exercise, healthy diet, medication compliance, etc.)  Goals  Addressed: Patient will:  Reduce symptoms of: anxiety and stress   Increase knowledge and/or ability of: stress reduction   Demonstrate ability to: Increase healthy adjustment to current life circumstances  Progress towards Goals: Ongoing  Interventions: Interventions utilized:  Mindfulness or Relaxation Training and Supportive Reflection Standardized Assessments completed: GAD-7 and PHQ 9  Patient and/or Family Response: Patient agrees with treatment plan.   Assessment: Patient currently experiencing Anxiety disorder, unspecified.   Patient may benefit from continued therapeutic interventions.  Plan: Follow up with behavioral health clinician on : Two weeks Behavioral recommendations:  -Continue taking prenatal vitamin and Lexapro as prescribed -Continue using relaxation breathing exercises daily; consider adding ocean sounds to replicate vacation feeling of calm -Continue using maternity pillow for increased comfort and improved sleep; discuss maternity band with ob at upcoming appointment  Referral(s): [place] (In Clinic)  I discussed the assessment and treatment plan with the patient and/or parent/guardian. They were provided an opportunity to ask questions and all were answered. They agreed with the plan and demonstrated an understanding of the instructions.   They were advised to call back or seek an in-person evaluation if the symptoms worsen or if the condition fails to improve as anticipated.  Garlan Fair, LCSW     08/01/2022   11:01 AM 05/24/2022    9:30 AM 06/16/2021    9:44 AM 08/08/2020   10:33 AM  Depression screen PHQ 2/9  Decreased Interest 0 0 1 0  Down, Depressed, Hopeless 1 0 1 0  PHQ - 2 Score 1 0 2 0  Altered sleeping 1 0 0 0  Tired, decreased energy '3 2 2 2  '$ Change in appetite 0 0 0 0  Feeling bad or failure about yourself  0 0 0 0  Trouble concentrating 0 0 0 0  Moving slowly or fidgety/restless 1  0 0 0  Suicidal  thoughts 0 0 0 0  PHQ-9 Score '6 2 4 2      '$ 08/01/2022   11:03 AM 05/24/2022    9:30 AM 06/16/2021    9:45 AM 08/08/2020   10:33 AM  GAD 7 : Generalized Anxiety Score  Nervous, Anxious, on Edge '2 1 1 1  '$ Control/stop worrying 0 0 0 0  Worry too much - different things '2 1 1 1  '$ Trouble relaxing 0 0 0 0  Restless 0 0 0 0  Easily annoyed or irritable '2 2 1 1  '$ Afraid - awful might happen 0 0 0 0  Total GAD 7 Score '6 4 3 '$ 3

## 2022-07-19 ENCOUNTER — Encounter (HOSPITAL_COMMUNITY): Payer: Medicaid Other

## 2022-07-20 ENCOUNTER — Telehealth: Payer: Self-pay | Admitting: Cardiology

## 2022-07-20 DIAGNOSIS — R002 Palpitations: Secondary | ICD-10-CM

## 2022-07-20 DIAGNOSIS — R55 Syncope and collapse: Secondary | ICD-10-CM

## 2022-07-20 NOTE — Telephone Encounter (Signed)
Pt called to report she had another episode and went to the ER on 7/25.  Notes visible in epic.   Pt states this occurred Tuesday after eating lunch.  She states she ate a full meal and then passed out in the kitchen into the floor.  Family found her and called 911.  She stated that she lost bladder control.  She stated EMS came and took her vitals, blood sugar, and EKG.   Pt states the medic informed her that her BS was only in the high 60's, and that's after she had eaten a full meal prior to her episode.  She was transported to [facility] ER for evaluation.  They did full work-up and checked the baby, and everything came back within normal limits.  Pt is worried for she stated before her episode she started having "hard palpitations."  She is also worried because her hemoglobin is dropping to now 10.6 and she is unable to keep her iron pills down.  She is also worried about her low BS.  Pt is asking Dr. Johney Frame if she should have another heart monitor ordered.  Pt aware I will go ahead and place the order for a 14 day zio in the system and have our monitor tech enroll her.  Pt states she will be on vacation all next week, and have them mail this towards the end of next week.   Advised the pt that Dr. Johney Frame wants her to follow-up with her OBGYN about concern of low blood sugar and hemoglobin of 10.6.  Advised the pt that they may want to do further testing for this.   Pt verbalized understanding and agrees with this plan.

## 2022-07-20 NOTE — Telephone Encounter (Signed)
Pt c/o Syncope: STAT if syncope occurred within 30 minutes and pt complains of lightheadedness High Priority if episode of passing out, completely, today or in last 24 hours   Did you pass out today?  No  When is the last time you passed out? 07/25   Has this occurred multiple times? Yes   Did you have any symptoms prior to passing out? No  Pt was diagnosed with Vasovagal syncope and had an episode this past Tuesday 07/25 where she had to go to the ED. She states that Dr. Johney Frame told the pt to let her know if she ever wanted to wear the heart monitor again and the patient would like a call back to discuss her options for this.  She also wants to state that this episode was a little different due to the incontinence that occurred and would like to discuss this with someone. Please advise.

## 2022-07-23 ENCOUNTER — Telehealth: Payer: Self-pay | Admitting: *Deleted

## 2022-07-23 ENCOUNTER — Ambulatory Visit (INDEPENDENT_AMBULATORY_CARE_PROVIDER_SITE_OTHER): Payer: Medicaid Other

## 2022-07-23 DIAGNOSIS — R002 Palpitations: Secondary | ICD-10-CM

## 2022-07-23 DIAGNOSIS — R55 Syncope and collapse: Secondary | ICD-10-CM

## 2022-07-23 NOTE — Telephone Encounter (Signed)
-----   Message from Jennefer Bravo sent at 07/23/2022 10:04 AM EDT ----- Regarding: RE: 14 day zio per Dr. Eugenie Norrie OB pt Ordered by AG to be mailed 8/4 ----- Message ----- From: Nuala Alpha, LPN Sent: 0/09/2724   4:57 PM EDT To: Nuala Alpha, LPN; Shelly A Wells; # Subject: 14 day zio per Dr. Eugenie Norrie OB pt     Dr. Johney Frame follows this pt as Cardio OB pt.  Pt is having complaints of palpitations.  Dr. Johney Frame ordered a 14 day zio on the pt.  The pt will be out of town all of next week and will return on 8/6.  Please enroll and mail this to the pt at the middle or end of next week, for she will not be in town to check her mail until 8/6.  Enroll and mail then let me know when you do?   Thanks a ton [employer]

## 2022-07-24 ENCOUNTER — Telehealth: Payer: Self-pay | Admitting: *Deleted

## 2022-07-24 NOTE — Telephone Encounter (Signed)
-----   Message from April Garrison, [place] sent at 07/24/2022  7:41 AM EDT ----- Regarding: FW: 14 day zio per Dr. Eugenie Norrie OB pt Pt is enrolled...  ----- Message ----- From: Nuala Alpha, LPN Sent: 4/65/6812   4:57 PM EDT To: Nuala Alpha, LPN; Shelly A Wells; # Subject: 14 day zio per Dr. Eugenie Norrie OB pt     Dr. Johney Frame follows this pt as Cardio OB pt.  Pt is having complaints of palpitations.  Dr. Johney Frame ordered a 14 day zio on the pt.  The pt will be out of town all of next week and will return on 8/6.  Please enroll and mail this to the pt at the middle or end of next week, for she will not be in town to check her mail until 8/6.  Enroll and mail then let me know when you do?   Thanks a ton [employer]

## 2022-07-30 ENCOUNTER — Telehealth: Payer: Self-pay | Admitting: Women's Health

## 2022-07-30 NOTE — Telephone Encounter (Signed)
Patient states her hemoglobin is now down to 10.6.  She has been taking additional iron daily but is unable to keep it down.   She has tried taking her PNV in the morning and then the iron in the evening but that doesn't seem to help.  Advised to try taking Zofran a few minutes before taking her iron tablets but only every other day. If still having trouble keeping it down, to let us know.  Pt verbalized understanding and no other questions.

## 2022-07-30 NOTE — Telephone Encounter (Signed)
PT is having trouble with her hemoglobin please advise. Pt is wanting a call back.

## 2022-08-01 ENCOUNTER — Ambulatory Visit (INDEPENDENT_AMBULATORY_CARE_PROVIDER_SITE_OTHER): Payer: Medicaid Other | Admitting: Clinical

## 2022-08-01 DIAGNOSIS — F419 Anxiety disorder, unspecified: Secondary | ICD-10-CM | POA: Diagnosis not present

## 2022-08-01 NOTE — Patient Instructions (Signed)
[facility] [address] [place], [place] [address] [phone] (main office) [phone] (Shakeela Rabadan's office)   

## 2022-08-02 DIAGNOSIS — R002 Palpitations: Secondary | ICD-10-CM

## 2022-08-02 DIAGNOSIS — R55 Syncope and collapse: Secondary | ICD-10-CM | POA: Diagnosis not present

## 2022-08-07 NOTE — BH Specialist Note (Signed)
Integrated Behavioral Health via Telemedicine Visit  08/20/2022 Vickie Sutton [id number]  Number of [place] Clinician visits: 3- Third Visit  Session Start time: 1419   Session End time: 7591  Total time in minutes: 34   Referring Provider: Christin Fudge, CNM Patient/Family location: Home [facility] Provider location: [facility] [place] [facility]  All persons participating in visit: [name] and [place]   Types of Service: Individual psychotherapy and Video visit  I connected with [name] and/or [name]  n/a  via  Telephone or Video Enabled Telemedicine Application  (Video is Caregility application) and verified that I am speaking with the correct person using two identifiers. Discussed confidentiality: Yes   I discussed the limitations of telemedicine and the availability of in person appointments.  Discussed there is a possibility of technology failure and discussed alternative modes of communication if that failure occurs.  I discussed that engaging in this telemedicine visit, they consent to the provision of behavioral healthcare and the services will be billed under their insurance.  Patient and/or legal guardian expressed understanding and consented to Telemedicine visit: Yes   Presenting Concerns: Patient and/or family reports the following symptoms/concerns: Increased anxiety with life stress (FOB moved out of [place] one week ago; says he wants to have baby visit him without pt postpartum; uncertainty about legal custody issues in the future; work stress/doing too much leading to spotting in pregnancy). Duration of problem: Increase one week after baby's father moved out-of-state; Severity of problem: moderate  Patient and/or Family's Strengths/Protective Factors: Social connections, Concrete supports in place (healthy food, safe environments, etc.), Sense of purpose, and Physical  Health (exercise, healthy diet, medication compliance, etc.)  Goals Addressed: Patient will:  Reduce symptoms of: anxiety and stress   Increase knowledge and/or ability of: stress reduction   Demonstrate ability to: Increase healthy adjustment to current life circumstances  Progress towards Goals: Ongoing  Interventions: Interventions utilized:  Link to [employer] and Supportive Reflection Standardized Assessments completed: Not Needed  Patient and/or Family Response: Patient agrees with treatment plan.   Assessment: Patient currently experiencing Anxiety disorder, unspecified.   Patient may benefit from psychoeducation and brief therapeutic interventions regarding coping with symptoms of anxiety and current life stress .  Plan: Follow up with behavioral health clinician on : Two weeks Behavioral recommendations:  -Continue taking prenatal vitamin and Lexapro as prescribed -Continue using self-coping strategies daily as needed (breathing exercises, ocean sounds, accepting help from supportive family) -Consider legal resources, as needed, on After Visit Summary Referral(s): [place] (In Clinic) and [employer]:  legal  I discussed the assessment and treatment plan with the patient and/or parent/guardian. They were provided an opportunity to ask questions and all were answered. They agreed with the plan and demonstrated an understanding of the instructions.   They were advised to call back or seek an in-person evaluation if the symptoms worsen or if the condition fails to improve as anticipated.  Garlan Fair, LCSW     08/01/2022   11:01 AM 05/24/2022    9:30 AM 06/16/2021    9:44 AM 08/08/2020   10:33 AM  Depression screen PHQ 2/9  Decreased Interest 0 0 1 0  Down, Depressed, Hopeless 1 0 1 0  PHQ - 2 Score 1 0 2 0  Altered sleeping 1 0 0 0  Tired, decreased energy '3 2 2 2  '$ Change in appetite 0 0 0 0  Feeling bad or failure  about yourself  0 0 0 0  Trouble concentrating 0 0 0 0  Moving slowly or fidgety/restless 1 0 0 0  Suicidal thoughts 0 0 0 0  PHQ-9 Score '6 2 4 2      '$ 08/01/2022   11:03 AM 05/24/2022    9:30 AM 06/16/2021    9:45 AM 08/08/2020   10:33 AM  GAD 7 : Generalized Anxiety Score  Nervous, Anxious, on Edge '2 1 1 1  '$ Control/stop worrying 0 0 0 0  Worry too much - different things '2 1 1 1  '$ Trouble relaxing 0 0 0 0  Restless 0 0 0 0  Easily annoyed or irritable '2 2 1 1  '$ Afraid - awful might happen 0 0 0 0  Total GAD 7 Score '6 4 3 '$ 3

## 2022-08-10 ENCOUNTER — Ambulatory Visit (INDEPENDENT_AMBULATORY_CARE_PROVIDER_SITE_OTHER): Payer: Medicaid Other | Admitting: Advanced Practice Midwife

## 2022-08-10 ENCOUNTER — Encounter: Payer: Self-pay | Admitting: Advanced Practice Midwife

## 2022-08-10 VITALS — BP 102/70 | HR 67 | Wt 159.5 lb

## 2022-08-10 DIAGNOSIS — O99012 Anemia complicating pregnancy, second trimester: Secondary | ICD-10-CM

## 2022-08-10 DIAGNOSIS — Z3480 Encounter for supervision of other normal pregnancy, unspecified trimester: Secondary | ICD-10-CM

## 2022-08-10 DIAGNOSIS — Z8759 Personal history of other complications of pregnancy, childbirth and the puerperium: Secondary | ICD-10-CM

## 2022-08-10 DIAGNOSIS — F419 Anxiety disorder, unspecified: Secondary | ICD-10-CM

## 2022-08-10 DIAGNOSIS — Z8659 Personal history of other mental and behavioral disorders: Secondary | ICD-10-CM

## 2022-08-10 DIAGNOSIS — Z3A23 23 weeks gestation of pregnancy: Secondary | ICD-10-CM

## 2022-08-10 DIAGNOSIS — G90A Postural orthostatic tachycardia syndrome (POTS): Secondary | ICD-10-CM

## 2022-08-10 LAB — POCT HEMOGLOBIN: Hemoglobin: 10.3 g/dL — AB (ref 11–14.6)

## 2022-08-10 MED ORDER — FERROUS SULFATE 300 (60 FE) MG/5ML PO SYRP: 300.0000 mg | ORAL_SOLUTION | ORAL | 3 refills | Status: DC

## 2022-08-10 NOTE — Progress Notes (Addendum)
   LOW-RISK PREGNANCY VISIT Patient name: Vickie Sutton MRN [id number]  Date of birth: December 28, 2000 Chief Complaint:   Routine Prenatal Visit (Pt requested iron check)  History of Present Illness:   Vickie Sutton is a 21 y.o. G1P1001 female at 20w5dwith an Estimated Date of Delivery: 12/02/22 being seen today for ongoing management of a low-risk pregnancy.  Today she reports  wearing Holter currently due to 2 syncopal episodes (seeing Dr PJohney Frame; seeing JRoselyn Reefq 2wks and doing well with Lexapro; was also worried about Hgb as a cause of syncope . Contractions: Not present. Vag. Bleeding: None.  Movement: Present. denies leaking of fluid. Review of Systems:   Pertinent items are noted in HPI Denies abnormal vaginal discharge w/ itching/odor/irritation, headaches, visual changes, shortness of breath, chest pain, abdominal pain, severe nausea/vomiting, or problems with urination or bowel movements unless otherwise stated above. Pertinent History Reviewed:  Reviewed past medical,surgical, social, obstetrical and family history.  Reviewed problem list, medications and allergies. Physical Assessment:   Vitals:   08/10/22 1036  BP: 102/70  Pulse: 67  Weight: 159 lb 8 oz (72.3 kg)  Body mass index is 26.54 kg/m.        Physical Examination:   General appearance: Well appearing, and in no distress  Mental status: Alert, oriented to person, place, and time  Skin: Warm & dry  Cardiovascular: Normal heart rate noted  Respiratory: Normal respiratory effort, no distress  Abdomen: Soft, gravid, nontender  Pelvic: Cervical exam deferred         Extremities: Edema: None  Fetal Status: Fetal Heart Rate (bpm): 155 Fundal Height: 23 cm Movement: Present    Results for orders placed or performed in visit on 08/10/22 (from the past 24 hour(s))  POCT hemoglobin   Collection Time: 08/10/22 10:40 AM  Result Value Ref Range   Hemoglobin 10.3 (A) 11 - 14.6 g/dL    Assessment & Plan:  1) Low-risk  pregnancy G2P1001 at 238w5dith an Estimated Date of Delivery: 12/02/22   2) POTS, wearing Holter currently and seeing Dr PeJohney Frame/25; no meds currently  3) Mild anemia, Hgb 10.3 today, will start Fe qod  4) Anxiety, doing well on Lexapro '20mg'$ ; seeing [name] ~ q 2wks  5) Hx Roux-en-Y, no concerns   Meds:  Meds ordered this encounter  Medications   ferrous sulfate 300 (60 Fe) MG/5ML syrup    Sig: Take 5 mLs (300 mg total) by mouth every other day.    Dispense:  150 mL    Refill:  3    Order Specific Question:   Supervising Provider    Answer:   EUTania Ade [[id number]]   Labs/procedures today: Hgb fingerstick  Plan:  Continue routine obstetrical care   Reviewed: Preterm labor symptoms and general obstetric precautions including but not limited to vaginal bleeding, contractions, leaking of fluid and fetal movement were reviewed in detail with the patient.  All questions were answered. Has home bp cuff.  Check bp weekly, let usKoreanow if >140/90.   Follow-up: Return in about 4 weeks (around 09/07/2022) for LROB, PN2, in person.  Orders Placed This Encounter  Procedures   POCT hemoglobin   KiMyrtis SerNBald Mountain Surgical Center/18/2023 11:06 AM

## 2022-08-10 NOTE — Patient Instructions (Signed)
[name], I greatly value your feedback.  If you receive a survey following your visit with [place] today, we appreciate you taking the time to fill it out.  Thanks, Derrill Memo, CNM   You will have your sugar test next visit.  Please do not eat or drink anything after midnight the night before you come, not even water.  You will be here for at least two hours.  Please make an appointment online for the bloodwork at [email or web address] for 8:30am (or as close to this as possible). Make sure you select the [facility] service center. The day of the appointment, check in with our office first, then you will go to [place] to start the sugar test.    [place]!!! It is now [place] at [facility] ([place], [place] [address]) Entrance C, located off of [address] parking  Go to [email or web address] to register for FREE online childbirth classes   Call the office [phone]) or go to [facility] if: You begin to have strong, frequent contractions Your water breaks.  Sometimes it is a big gush of fluid, sometimes it is just a trickle that keeps getting your panties wet or running down your legs You have vaginal bleeding.  It is normal to have a small amount of spotting if your cervix was checked.  You don't feel your baby moving like normal.  If you don't, get you something to eat and drink and lay down and focus on feeling your baby move.   If your baby is still not moving like normal, you should call the office or go to [place] Pediatric[facility]/Family Doctors: [place] [phone]                [place] [phone] (usually not accepting new patients unless you have family there already, you are always welcome to call and ask)      [facility] Department [phone]       [facility] Pediatricians/Family Doctors:  [facility]: [phone] [facility]: [phone] [facility] [place]: [place] Doctors:  [facility]: [place] [facility]: [place]: [place]: [phone]   Home Blood Pressure Monitoring for Patients   Your provider has recommended that you check your blood pressure (BP) at least once a week at home. If you do not have a blood pressure cuff at home, one will be provided for you. Contact your provider if you have not received your monitor within 1 week.   Helpful Tips for Accurate Home Blood Pressure Checks  Don't smoke, exercise, or drink caffeine 30 minutes before checking your BP Use the restroom before checking your BP (a full bladder can raise your pressure) Relax in a comfortable upright chair Feet on the ground Left arm resting comfortably on a flat surface at the level of your heart Legs uncrossed Back supported Sit quietly and don't talk Place the cuff on your bare arm Adjust snuggly, so that only two fingertips can fit between your skin and the top of the cuff Check 2 readings separated by at least one minute Keep a log of your BP readings For a visual, please reference this diagram: http://ccnc.care/bpdiagram  Provider Name: [facility]     Phone: [phone]  Zone 1: ALL CLEAR  Continue to monitor your symptoms:  BP reading is less than 140 (top number) or less than 90 (bottom number)  No right upper stomach pain No headaches or seeing spots No feeling nauseated or throwing up No swelling in face and hands  Zone 2: CAUTION Call your doctor's office for any of the following:  BP reading is greater than 140 (top number) or greater than 90 (bottom number)  Stomach pain under your ribs in the middle or right side Headaches or seeing spots Feeling nauseated or throwing up Swelling in face and hands  Zone 3: EMERGENCY  Seek  immediate medical care if you have any of the following:  BP reading is greater than160 (top number) or greater than 110 (bottom number) Severe headaches not improving with Tylenol Serious difficulty catching your breath Any worsening symptoms from Zone 2   Second Trimester of Pregnancy The second trimester is from week 13 through week 28, months 4 through 6. The second trimester is often a time when you feel your best. Your body has also adjusted to being pregnant, and you begin to feel better physically. Usually, morning sickness has lessened or quit completely, you may have more energy, and you may have an increase in appetite. The second trimester is also a time when the fetus is growing rapidly. At the end of the sixth month, the fetus is about 9 inches long and weighs about 1 pounds. You will likely begin to feel the baby move (quickening) between 18 and 20 weeks of the pregnancy. BODY CHANGES Your body goes through many changes during pregnancy. The changes vary from woman to woman.  Your weight will continue to increase. You will notice your lower abdomen bulging out. You may begin to get stretch marks on your hips, abdomen, and breasts. You may develop headaches that can be relieved by medicines approved by your health care provider. You may urinate more often because the fetus is pressing on your bladder. You may develop or continue to have heartburn as a result of your pregnancy. You may develop constipation because certain hormones are causing the muscles that push waste through your intestines to slow down. You may develop hemorrhoids or swollen, bulging veins (varicose veins). You may have back pain because of the weight gain and pregnancy hormones relaxing your joints between the bones in your pelvis and as a result of a shift in weight and the muscles that support your balance. Your breasts will continue to grow and be tender. Your gums may bleed and may be sensitive to brushing  and flossing. Dark spots or blotches (chloasma, mask of pregnancy) may develop on your face. This will likely fade after the baby is born. A dark line from your belly button to the pubic area (linea nigra) may appear. This will likely fade after the baby is born. You may have changes in your hair. These can include thickening of your hair, rapid growth, and changes in texture. Some women also have hair loss during or after pregnancy, or hair that feels dry or thin. Your hair will most likely return to normal after your baby is born. WHAT TO EXPECT AT YOUR PRENATAL VISITS During a routine prenatal visit: You will be weighed to make sure you and the fetus are growing normally. Your blood pressure will be taken. Your abdomen will be measured to track your baby's growth. The fetal heartbeat will be listened to. Any test results from the previous visit will be discussed. Your health care provider  may ask you: How you are feeling. If you are feeling the baby move. If you have had any abnormal symptoms, such as leaking fluid, bleeding, severe headaches, or abdominal cramping. If you have any questions. Other tests that may be performed during your second trimester include: Blood tests that check for: Low iron levels (anemia). Gestational diabetes (between 24 and 28 weeks). Rh antibodies. Urine tests to check for infections, diabetes, or protein in the urine. An ultrasound to confirm the proper growth and development of the baby. An amniocentesis to check for possible genetic problems. Fetal screens for spina bifida and Down syndrome. HOME CARE INSTRUCTIONS  Avoid all smoking, herbs, alcohol, and unprescribed drugs. These chemicals affect the formation and growth of the baby. Follow your health care provider's instructions regarding medicine use. There are medicines that are either safe or unsafe to take during pregnancy. Exercise only as directed by your health care provider. Experiencing  uterine cramps is a good sign to stop exercising. Continue to eat regular, healthy meals. Wear a good support bra for breast tenderness. Do not use hot tubs, steam rooms, or saunas. Wear your seat belt at all times when driving. Avoid raw meat, uncooked cheese, cat litter boxes, and soil used by cats. These carry germs that can cause birth defects in the baby. Take your prenatal vitamins. Try taking a stool softener (if your health care provider approves) if you develop constipation. Eat more high-fiber foods, such as fresh vegetables or fruit and whole grains. Drink plenty of fluids to keep your urine clear or pale yellow. Take warm sitz baths to soothe any pain or discomfort caused by hemorrhoids. Use hemorrhoid cream if your health care provider approves. If you develop varicose veins, wear support hose. Elevate your feet for 15 minutes, 3-4 times a day. Limit salt in your diet. Avoid heavy lifting, wear low heel shoes, and practice good posture. Rest with your legs elevated if you have leg cramps or low back pain. Visit your dentist if you have not gone yet during your pregnancy. Use a soft toothbrush to brush your teeth and be gentle when you floss. A sexual relationship may be continued unless your health care provider directs you otherwise. Continue to go to all your prenatal visits as directed by your health care provider. SEEK MEDICAL CARE IF:  You have dizziness. You have mild pelvic cramps, pelvic pressure, or nagging pain in the abdominal area. You have persistent nausea, vomiting, or diarrhea. You have a bad smelling vaginal discharge. You have pain with urination. SEEK IMMEDIATE MEDICAL CARE IF:  You have a fever. You are leaking fluid from your vagina. You have spotting or bleeding from your vagina. You have severe abdominal cramping or pain. You have rapid weight gain or loss. You have shortness of breath with chest pain. You notice sudden or extreme swelling of your face,  hands, ankles, feet, or legs. You have not felt your baby move in over an hour. You have severe headaches that do not go away with medicine. You have vision changes. Document Released: 12/04/2001 Document Revised: 12/15/2013 Document Reviewed: 02/10/2013 [facility] Patient Information 2015 [place], [place]. This information is not intended to replace advice given to you by your health care provider. Make sure you discuss any questions you have with your health care provider.

## 2022-08-16 NOTE — Progress Notes (Deleted)
[facility]  Follow-up Visit:  Date:  08/16/2022   ID:  Vickie Sutton, DOB [birth year], MRN [id number]  PCP:  Rosine Door   [facility] Providers Cardiologist:  Rozann Lesches, MD  Electrophysiologist:  None       Referring MD: Rosalee Kaufman, *   Chief Complaint: Syncope  History of Present Illness:    Vickie Sutton is a 21 y.o. female [G2P1001] who presents to clinic for follow-up for recurrent syncope.  Patient was followed by Dr. Domenic Polite for episodes of syncope thought to be neurocardiogenic in nature.TTE 01/2020 showed LVEF 55%, normal RV, no significant valve disease. Cardiac monitor 07/2021 with sinus brady-sinus tach. No arrhythmias. She was maintained on metoprolol.   She has since followed with [facility] and has been diagnosed with vasovagal syncope which has been conservatively managed.  Was last seen in clinic on 07/03/22 where she continued to have frequent episodes of syncope and presyncope. She was also needing IVF and phenergan for nausea/vomiting in pregnancy. Symptoms thought to be related to chronic vasovagal symptoms and likely some orthostasis in pregnancy that was exacerbated by dehydration. She called clinic several times with recurrent episodes. Cardiac monitor was ordered and is pending.   Today, ***  Prior CV Studies Reviewed: The following studies were reviewed today: Echocardiogram 02/09/2020:  1. Left ventricular ejection fraction, by estimation, is 55%. The left  ventricle has normal function. The left ventricle has no regional wall  motion abnormalities. Left ventricular diastolic parameters were normal.   2. Right ventricular systolic function is normal. The right ventricular  size is normal. There is normal pulmonary artery systolic pressure. The  estimated right ventricular systolic pressure is 25.0 mmHg.   3. The mitral valve is grossly normal. No evidence of mitral valve  regurgitation.   4. The  aortic valve is tricuspid. Aortic valve regurgitation is not  visualized.   5. The inferior vena cava is normal in size with greater than 50%  respiratory variability, suggesting right atrial pressure of 3 mmHg.    Cardiac monitor August 2022: [employer] monitor reviewed.  30 days analyzed.  Predominant rhythm is sinus with heart rate ranging from 54 bpm in sinus bradycardia up to 158 bpm in sinus tachycardia.  There were no obvious arrhythmias documented, no pauses.  Symptoms reported as rapid heartbeat and dizziness did not necessarily correspond to a particular heart rate.    Past Medical History:  Diagnosis Date   ADD (attention deficit disorder)    Anxiety    Asthma    Depression    Migraine    Panic attacks    Polycystic ovarian syndrome    POTS (postural orthostatic tachycardia syndrome)    Syncope    Neurocardiogenic suspected    Past Surgical History:  Procedure Laterality Date   ESOPHAGOGASTRODUODENOSCOPY     LAPAROTOMY N/A 09/07/2020   Procedure: EXPLORATORY LAPAROTOMY WITH EXTRACTION  OVARIAN CYST;  Surgeon: Florian Buff, MD;  Location: [facility];  Service: Gynecology;  Laterality: N/A;   OVARIAN CYST SURGERY Right    ROUX-EN-Y GASTRIC BYPASS     August 2020 - [facility]   TOOTH EXTRACTION N/A 12/12/2015   Procedure: EXTRACTION MOLARS - teeth one, sixteen, seventeen and thirty-two;  Surgeon: Diona Browner, DDS;  Location: [facility];  Service: Oral Surgery;  Laterality: N/A;      OB History     Gravida  2   Para  1   Term  1   Preterm  AB      Living  1      SAB      IAB      Ectopic      Multiple  0   Live Births  1               Current Medications: No outpatient medications have been marked as taking for the 08/17/22 encounter (Appointment) with Freada Bergeron, MD.     Allergies:   Patient has no known allergies.   Social History   Socioeconomic History   Marital status: Single    Spouse name: Not on file   Number of children: 1    Years of education: some college   Highest education level: Not on file  Occupational History   Occupation: Chartered certified accountant at [employer]  Tobacco Use   Smoking status: Never   Smokeless tobacco: Never  Vaping Use   Vaping Use: Former  Substance and Sexual Activity   Alcohol use: No   Drug use: No   Sexual activity: Yes    Birth control/protection: None  Other Topics Concern   Not on file  Social History Narrative   Right-handed.   No daily caffeine.   Lives at home with boyfriend and son.    Social Determinants of Health   Financial Resource Strain: Low Risk  (05/24/2022)   Overall Financial Resource Strain (CARDIA)    Difficulty of Paying Living Expenses: Not very hard  Food Insecurity: No Food Insecurity (05/24/2022)   Hunger Vital Sign    Worried About Running Out of Food in the Last Year: Never true    Ran Out of Food in the Last Year: Never true  Transportation Needs: No Transportation Needs (05/24/2022)   PRAPARE - Hydrologist (Medical): No    Lack of Transportation (Non-Medical): No  Physical Activity: Insufficiently Active (05/24/2022)   Exercise Vital Sign    Days of Exercise per Week: 4 days    Minutes of Exercise per Session: 30 min  Stress: Stress Concern Present (05/24/2022)   [place]    Feeling of Stress : To some extent  Social Connections: Socially Isolated (05/24/2022)   Social Connection and Isolation Panel [NHANES]    Frequency of Communication with Friends and Family: More than three times a week    Frequency of Social Gatherings with Friends and Family: More than three times a week    Attends Religious Services: Never    Marine scientist or Organizations: No    Attends Music therapist: Never    Marital Status: Never married      Family History  Problem Relation Age of Onset   Diabetes Mellitus II Maternal Grandmother    Heart disease Maternal  Grandmother    Obesity Mother    Yves Dill Parkinson White syndrome Brother    Seizures Maternal Aunt    Pneumonia Son       ROS:   Please see the history of present illness.    (+) Chest tightness/pressure (+) Palpitations (+) Shortness of breath (+) Syncope (+) Vertigo  (+) Lightheadedness (+) Nausea All other systems reviewed and are negative.   Labs/EKG Reviewed:    EKG:  EKG is personally reviewed.  07/03/2022 EKG: NSR with HR 79bpm  Recent Labs: 02/26/2022: TSH 1.610 04/21/2022: ALT 13 07/11/2022: BUN 11; Creatinine, Ser 0.50; Platelets 270; Potassium 3.5; Sodium 134 08/10/2022: Hemoglobin 10.3   Recent  Lipid Panel No results found for: "CHOL", "TRIG", "HDL", "CHOLHDL", "LDLCALC", "LDLDIRECT"  Physical Exam:    VS:  LMP 02/19/2022 (Exact Date)     Wt Readings from Last 3 Encounters:  08/10/22 159 lb 8 oz (72.3 kg)  07/13/22 152 lb (68.9 kg)  07/11/22 151 lb (68.5 kg)     GEN:  Well nourished, well developed in no acute distress HEENT: Normal NECK: No JVD; No carotid bruits CARDIAC: RRR, no murmurs, rubs, gallops RESPIRATORY:  Clear to auscultation without rales, wheezing or rhonchi  ABDOMEN: Gravid, soft MUSCULOSKELETAL:  No edema; No deformity  SKIN: Warm and dry NEUROLOGIC:  Alert and oriented x 3 PSYCHIATRIC:  Normal affect    Risk Assessment/Risk Calculators:   {  ASSESSMENT & PLAN:    #Vasovagal Syncope: Followed previously by Dr. Domenic Polite and more recently at [facility]. Thought to be vasovagal in nature. Cardiac monitor without arrhythmias. Likely exacerbated by the hemodynamic changes in pregnancy with possible element of orthostasis given persistent nausea, vomiting and dehydration. Will continue with conservative management with increased fluid intake including with electrolyte replacement drinks as tolerated, small frequent meals with salty snacks, compression therapy, slow position changes. Discussed that if she feels like she is going to pass out, to  lay down on her side and pull her legs to her chest or elevate her legs. Agree with continued IVF as needed for dehydration. -TTE with normal BiV function -Cardiac monitor without arrhythmias -Continue IVF for dehydration per OBGYN -Compression therapy as tolerated -Small, frequent meals as tolerated -Electrolyte drinks and hydration as able -Will follow-up in 4 weeks to reassess  There are no Patient Instructions on file for this visit.   Medication Adjustments/Labs and Tests Ordered: Current medicines are reviewed at length with the patient today.  Concerns regarding medicines are outlined above.  Tests Ordered: No orders of the defined types were placed in this encounter.  Medication Changes: No orders of the defined types were placed in this encounter.

## 2022-08-17 ENCOUNTER — Ambulatory Visit: Payer: Medicaid Other | Admitting: Cardiology

## 2022-08-20 ENCOUNTER — Ambulatory Visit (INDEPENDENT_AMBULATORY_CARE_PROVIDER_SITE_OTHER): Payer: Medicaid Other | Admitting: Clinical

## 2022-08-20 DIAGNOSIS — F419 Anxiety disorder, unspecified: Secondary | ICD-10-CM | POA: Diagnosis not present

## 2022-08-20 NOTE — Patient Instructions (Addendum)
Center for [facility] at [facility] [place], [place] [address] [phone] (main office) [phone] ([place] office)  [employer] [place] [email or web address] [phone]  [facility] [email or web address]

## 2022-08-21 NOTE — BH Specialist Note (Signed)
Integrated Behavioral Health via Telemedicine Visit  09/03/2022 Charlie Char [id number]  Number of [place] Clinician visits: 4- Fourth Visit  Session Start time: 1605   Session End time: 7681  Total time in minutes: 39   Referring Provider: Christin Fudge, CNM Patient/Family location: Home [facility] Provider location: Center for [place] at [facility]  All persons participating in visit: [name] and [name]   Types of Service: Individual psychotherapy and Video visit  I connected with [name] and/or [name]  n/a  via  Telephone or Video Enabled Telemedicine Application  (Video is Caregility application) and verified that I am speaking with the correct person using two identifiers. Discussed confidentiality: Yes   I discussed the limitations of telemedicine and the availability of in person appointments.  Discussed there is a possibility of technology failure and discussed alternative modes of communication if that failure occurs.  I discussed that engaging in this telemedicine visit, they consent to the provision of behavioral healthcare and the services will be billed under their insurance.  Patient and/or legal guardian expressed understanding and consented to Telemedicine visit: Yes   Presenting Concerns: Patient and/or family reports the following symptoms/concerns: Processing feelings regarding father of baby's words vs actions. Duration of problem: Current pregnancy; Severity of problem: moderate  Patient and/or Family's Strengths/Protective Factors: Social connections, Social and Emotional competence, Concrete supports in place (healthy food, safe environments, etc.), Sense of purpose, and Physical Health (exercise, healthy diet, medication compliance, etc.)  Goals Addressed: Patient will:  Reduce symptoms of: anxiety    Demonstrate ability to: Increase healthy adjustment to  current life circumstances  Progress towards Goals: Ongoing  Interventions: Interventions utilized:  Supportive Reflection Standardized Assessments completed: Not Needed  Patient and/or Family Response: Patient agrees with treatment plan.   Assessment: Patient currently experiencing Anxiety disorder .   Patient may benefit from continued therapeutic interventions.  Plan: Follow up with behavioral health clinician on : One month Behavioral recommendations:  -Continue taking prenatal vitamin and Lexapro as prescribed -Continue using healthy self-coping strategies daily; setting healthy boundaries with baby's father Referral(s): [place] (In Clinic)  I discussed the assessment and treatment plan with the patient and/or parent/guardian. They were provided an opportunity to ask questions and all were answered. They agreed with the plan and demonstrated an understanding of the instructions.   They were advised to call back or seek an in-person evaluation if the symptoms worsen or if the condition fails to improve as anticipated.  Caroleen Hamman Mykal Batiz, LCSW

## 2022-09-03 ENCOUNTER — Ambulatory Visit (INDEPENDENT_AMBULATORY_CARE_PROVIDER_SITE_OTHER): Payer: Medicaid Other | Admitting: Clinical

## 2022-09-03 DIAGNOSIS — F419 Anxiety disorder, unspecified: Secondary | ICD-10-CM

## 2022-09-07 ENCOUNTER — Other Ambulatory Visit: Payer: Medicaid Other

## 2022-09-07 ENCOUNTER — Ambulatory Visit (INDEPENDENT_AMBULATORY_CARE_PROVIDER_SITE_OTHER): Payer: Medicaid Other | Admitting: Advanced Practice Midwife

## 2022-09-07 ENCOUNTER — Encounter: Payer: Self-pay | Admitting: Advanced Practice Midwife

## 2022-09-07 VITALS — BP 113/58 | HR 102 | Wt 166.0 lb

## 2022-09-07 DIAGNOSIS — Z131 Encounter for screening for diabetes mellitus: Secondary | ICD-10-CM

## 2022-09-07 DIAGNOSIS — Z3480 Encounter for supervision of other normal pregnancy, unspecified trimester: Secondary | ICD-10-CM

## 2022-09-07 DIAGNOSIS — R300 Dysuria: Secondary | ICD-10-CM

## 2022-09-07 DIAGNOSIS — O26892 Other specified pregnancy related conditions, second trimester: Secondary | ICD-10-CM

## 2022-09-07 DIAGNOSIS — Z3A27 27 weeks gestation of pregnancy: Secondary | ICD-10-CM

## 2022-09-07 DIAGNOSIS — G90A Postural orthostatic tachycardia syndrome (POTS): Secondary | ICD-10-CM

## 2022-09-07 LAB — POCT URINALYSIS DIPSTICK OB
Blood, UA: NEGATIVE
Glucose, UA: NEGATIVE
Ketones, UA: NEGATIVE
Leukocytes, UA: NEGATIVE
Nitrite, UA: NEGATIVE
POC,PROTEIN,UA: NEGATIVE

## 2022-09-07 NOTE — Progress Notes (Signed)
   LOW-RISK PREGNANCY VISIT Patient name: Vickie Sutton MRN [id number]  Date of birth: 08-15-2001 Chief Complaint:   Routine Prenatal Visit (PN2)  History of Present Illness:   Vickie Sutton is a 21 y.o. G31P1001 female at 63w5dwith an Estimated Date of Delivery: 12/02/22 being seen today for ongoing management of a low-risk pregnancy.  Today she reports  some increased vag d/c- no irritation; mild dysuria . Contractions: Not present. Vag. Bleeding: None.  Movement: Present. denies leaking of fluid. Review of Systems:   Pertinent items are noted in HPI Denies abnormal vaginal discharge w/ itching/odor/irritation, headaches, visual changes, shortness of breath, chest pain, abdominal pain, severe nausea/vomiting, or problems with urination or bowel movements unless otherwise stated above. Pertinent History Reviewed:  Reviewed past medical,surgical, social, obstetrical and family history.  Reviewed problem list, medications and allergies. Physical Assessment:   Vitals:   09/07/22 0914  BP: (!) 113/58  Pulse: (!) 102  Weight: 166 lb (75.3 kg)  Body mass index is 27.62 kg/m.        Physical Examination:   General appearance: Well appearing, and in no distress  Mental status: Alert, oriented to person, place, and time  Skin: Warm & dry  Cardiovascular: Normal heart rate noted  Respiratory: Normal respiratory effort, no distress  Abdomen: Soft, gravid, nontender  Pelvic: Cervical exam deferred         Extremities: Edema: None  Fetal Status: Fetal Heart Rate (bpm): 158 Fundal Height: 27 cm Movement: Present    Results for orders placed or performed in visit on 09/07/22 (from the past 24 hour(s))  POC Urinalysis Dipstick OB   Collection Time: 09/07/22  9:42 AM  Result Value Ref Range   Color, UA     Clarity, UA     Glucose, UA Negative Negative   Bilirubin, UA     Ketones, UA neg    Spec Grav, UA     Blood, UA neg    pH, UA     POC,PROTEIN,UA Negative Negative, Trace, Small (1+),  Moderate (2+), Large (3+), 4+   Urobilinogen, UA     Nitrite, UA neg    Leukocytes, UA Negative Negative   Appearance     Odor      Assessment & Plan:  1) Low-risk pregnancy G2P1001 at 281w5dith an Estimated Date of Delivery: 12/02/22   2) Dysuria, UA neg  3) POTS/vasovagal syncope, wore Holter and cards said overall okay with some PVCs, they will follow her PP  4) Anxiety/panic attacks, doing okay on Lexapro '20mg'$   5) Anemia, taking qod Fe, Hgb today with PN2   Meds: No orders of the defined types were placed in this encounter.  Labs/procedures today: PN2  Plan:  Continue routine obstetrical care   Reviewed: Preterm labor symptoms and general obstetric precautions including but not limited to vaginal bleeding, contractions, leaking of fluid and fetal movement were reviewed in detail with the patient.  All questions were answered. Has home bp cuff. Check bp weekly, let usKoreanow if >140/90.   Follow-up: Return in about 3 weeks (around 09/28/2022) for L[place]agein person.  Orders Placed This Encounter  Procedures   POC Urinalysis Dipstick OB   [facility]/15/2023 10:03 AM

## 2022-09-07 NOTE — Patient Instructions (Signed)
Vickie Sutton, thank you for choosing our office today! We appreciate the opportunity to meet your healthcare needs. You may receive a short survey by mail, e-mail, or through MyChart. If you are happy with your care we would appreciate if you could take just a few minutes to complete the survey questions. We read all of your comments and take your feedback very seriously. Thank you again for choosing our office.  [facility] [place] ([address] , [place] [address]) Entrance C, located off of [address] Free 24/7 valet parking   CLASSES: Go to [email or web address] to register for classes (childbirth, breastfeeding, waterbirth, infant CPR, daddy bootcamp, etc.)  Call the office ([phone]) or go to [facility] if: You begin to have strong, frequent contractions Your water breaks.  Sometimes it is a big gush of fluid, sometimes it is just a trickle that keeps getting your panties wet or running down your legs You have vaginal bleeding.  It is normal to have a small amount of spotting if your cervix was checked.  You don't feel your baby moving like normal.  If you don't, get you something to eat and drink and lay down and focus on feeling your baby move.   If your baby is still not moving like normal, you should call the office or go to [facility].  Call the office ([phone]) or go to [facility] for these signs of pre-eclampsia: Severe headache that does not go away with Tylenol Visual changes- seeing spots, double, blurred vision Pain under your right breast or upper abdomen that does not go away with Tums or heartburn medicine Nausea and/or vomiting Severe swelling in your hands, feet, and face   Tdap Vaccine It is recommended that you get the Tdap vaccine during the third trimester of EACH pregnancy to help protect your baby from getting pertussis (whooping cough) 27-36 weeks is the BEST time to do  this so that you can pass the protection on to your baby. During pregnancy is better than after pregnancy, but if you are unable to get it during pregnancy it will be offered at the hospital.  You can get this vaccine with us, at the health department, your family doctor, or some local pharmacies Everyone who will be around your baby should also be up-to-date on their vaccines before the baby comes. Adults (who are not pregnant) only need 1 dose of Tdap during adulthood.   [place] Pediatricians/Family Doctors [facility] ([facility]): [address], [phone]           [facility]: [address], [phone]                [facility] ([facility]): [address], [phone] (call to ask if accepting patients) [facility]: [address], [place], [phone]    [place] Pediatricians/Family Doctors [facility] ([facility]): [address], [phone] [facility]: [address], [phone] [facility]: [address], [phone]  [place] [facility]  [facility] ([facility]): [phone] [facility]: [address], [phone]   [place] Family Doctors [facility]: [address], [phone]  [place] [facility]  [facility]: [address], [phone]  Home Blood Pressure Monitoring for Patients   Your provider has recommended that you check your   blood pressure (BP) at least once a week at home. If you do not have a blood pressure cuff at home, one will be provided for you. Contact your provider if you have not received your monitor within 1 week.   Helpful Tips for Accurate Home Blood Pressure Checks  Don't smoke, exercise, or drink caffeine 30 minutes before checking your BP Use the restroom before checking your BP (a full bladder can raise your  pressure) Relax in a comfortable upright chair Feet on the ground Left arm resting comfortably on a flat surface at the level of your heart Legs uncrossed Back supported Sit quietly and don't talk Place the cuff on your bare arm Adjust snuggly, so that only two fingertips can fit between your skin and the top of the cuff Check 2 readings separated by at least one minute Keep a log of your BP readings For a visual, please reference this diagram: http://ccnc.care/bpdiagram  Provider Name: [facility]     Phone: [phone]  Zone 1: ALL CLEAR  Continue to monitor your symptoms:  BP reading is less than 140 (top number) or less than 90 (bottom number)  No right upper stomach pain No headaches or seeing spots No feeling nauseated or throwing up No swelling in face and hands  Zone 2: CAUTION Call your doctor's office for any of the following:  BP reading is greater than 140 (top number) or greater than 90 (bottom number)  Stomach pain under your ribs in the middle or right side Headaches or seeing spots Feeling nauseated or throwing up Swelling in face and hands  Zone 3: EMERGENCY  Seek immediate medical care if you have any of the following:  BP reading is greater than160 (top number) or greater than 110 (bottom number) Severe headaches not improving with Tylenol Serious difficulty catching your breath Any worsening symptoms from Zone 2   Third Trimester of Pregnancy The third trimester is from week 29 through week 42, months 7 through 9. The third trimester is a time when the fetus is growing rapidly. At the end of the ninth month, the fetus is about 20 inches in length and weighs 6-10 pounds.  BODY CHANGES Your body goes through many changes during pregnancy. The changes vary from woman to woman.  Your weight will continue to increase. You can expect to gain 25-35 pounds (11-16 kg) by the end of the pregnancy. You may begin to get stretch marks on your hips, abdomen,  and breasts. You may urinate more often because the fetus is moving lower into your pelvis and pressing on your bladder. You may develop or continue to have heartburn as a result of your pregnancy. You may develop constipation because certain hormones are causing the muscles that push waste through your intestines to slow down. You may develop hemorrhoids or swollen, bulging veins (varicose veins). You may have pelvic pain because of the weight gain and pregnancy hormones relaxing your joints between the bones in your pelvis. Backaches may result from overexertion of the muscles supporting your posture. You may have changes in your hair. These can include thickening of your hair, rapid growth, and changes in texture. Some women also have hair loss during or after pregnancy, or hair that feels dry or thin. Your hair will most likely return to normal after your baby is born. Your breasts will continue to grow and be tender. A yellow discharge may leak from your breasts called colostrum. Your belly button may stick out. You may   feel short of breath because of your expanding uterus. You may notice the fetus "dropping," or moving lower in your abdomen. You may have a bloody mucus discharge. This usually occurs a few days to a week before labor begins. Your cervix becomes thin and soft (effaced) near your due date. WHAT TO EXPECT AT YOUR PRENATAL EXAMS  You will have prenatal exams every 2 weeks until week 36. Then, you will have weekly prenatal exams. During a routine prenatal visit: You will be weighed to make sure you and the fetus are growing normally. Your blood pressure is taken. Your abdomen will be measured to track your baby's growth. The fetal heartbeat will be listened to. Any test results from the previous visit will be discussed. You may have a cervical check near your due date to see if you have effaced. At around 36 weeks, your caregiver will check your cervix. At the same time, your  caregiver will also perform a test on the secretions of the vaginal tissue. This test is to determine if a type of bacteria, Group B streptococcus, is present. Your caregiver will explain this further. Your caregiver may ask you: What your birth plan is. How you are feeling. If you are feeling the baby move. If you have had any abnormal symptoms, such as leaking fluid, bleeding, severe headaches, or abdominal cramping. If you have any questions. Other tests or screenings that may be performed during your third trimester include: Blood tests that check for low iron levels (anemia). Fetal testing to check the health, activity level, and growth of the fetus. Testing is done if you have certain medical conditions or if there are problems during the pregnancy. FALSE LABOR You may feel small, irregular contractions that eventually go away. These are called Braxton Hicks contractions, or false labor. Contractions may last for hours, days, or even weeks before true labor sets in. If contractions come at regular intervals, intensify, or become painful, it is best to be seen by your caregiver.  SIGNS OF LABOR  Menstrual-like cramps. Contractions that are 5 minutes apart or less. Contractions that start on the top of the uterus and spread down to the lower abdomen and back. A sense of increased pelvic pressure or back pain. A watery or bloody mucus discharge that comes from the vagina. If you have any of these signs before the 37th week of pregnancy, call your caregiver right away. You need to go to the hospital to get checked immediately. HOME CARE INSTRUCTIONS  Avoid all smoking, herbs, alcohol, and unprescribed drugs. These chemicals affect the formation and growth of the baby. Follow your caregiver's instructions regarding medicine use. There are medicines that are either safe or unsafe to take during pregnancy. Exercise only as directed by your caregiver. Experiencing uterine cramps is a good sign to  stop exercising. Continue to eat regular, healthy meals. Wear a good support bra for breast tenderness. Do not use hot tubs, steam rooms, or saunas. Wear your seat belt at all times when driving. Avoid raw meat, uncooked cheese, cat litter boxes, and soil used by cats. These carry germs that can cause birth defects in the baby. Take your prenatal vitamins. Try taking a stool softener (if your caregiver approves) if you develop constipation. Eat more high-fiber foods, such as fresh vegetables or fruit and whole grains. Drink plenty of fluids to keep your urine clear or pale yellow. Take warm sitz baths to soothe any pain or discomfort caused by hemorrhoids. Use hemorrhoid cream if   your caregiver approves. If you develop varicose veins, wear support hose. Elevate your feet for 15 minutes, 3-4 times a day. Limit salt in your diet. Avoid heavy lifting, wear low heal shoes, and practice good posture. Rest a lot with your legs elevated if you have leg cramps or low back pain. Visit your dentist if you have not gone during your pregnancy. Use a soft toothbrush to brush your teeth and be gentle when you floss. A sexual relationship may be continued unless your caregiver directs you otherwise. Do not travel far distances unless it is absolutely necessary and only with the approval of your caregiver. Take prenatal classes to understand, practice, and ask questions about the labor and delivery. Make a trial run to the hospital. Pack your hospital bag. Prepare the baby's nursery. Continue to go to all your prenatal visits as directed by your caregiver. SEEK MEDICAL CARE IF: You are unsure if you are in labor or if your water has broken. You have dizziness. You have mild pelvic cramps, pelvic pressure, or nagging pain in your abdominal area. You have persistent nausea, vomiting, or diarrhea. You have a bad smelling vaginal discharge. You have pain with urination. SEEK IMMEDIATE MEDICAL CARE IF:  You  have a fever. You are leaking fluid from your vagina. You have spotting or bleeding from your vagina. You have severe abdominal cramping or pain. You have rapid weight loss or gain. You have shortness of breath with chest pain. You notice sudden or extreme swelling of your face, hands, ankles, feet, or legs. You have not felt your baby move in over an hour. You have severe headaches that do not go away with medicine. You have vision changes. Document Released: 12/04/2001 Document Revised: 12/15/2013 Document Reviewed: 02/10/2013 [facility] Patient Information 2015 [place], [place]. This information is not intended to replace advice given to you by your health care provider. Make sure you discuss any questions you have with your health care provider.

## 2022-09-10 ENCOUNTER — Encounter: Payer: Self-pay | Admitting: Obstetrics & Gynecology

## 2022-09-10 LAB — CBC
Hematocrit: 32 % — ABNORMAL LOW (ref 34.0–46.6)
Hemoglobin: 10.2 g/dL — ABNORMAL LOW (ref 11.1–15.9)
MCH: 26.1 pg — ABNORMAL LOW (ref 26.6–33.0)
MCHC: 31.9 g/dL (ref 31.5–35.7)
MCV: 82 fL (ref 79–97)
Platelets: 244 10*3/uL (ref 150–450)
RBC: 3.91 x10E6/uL (ref 3.77–5.28)
RDW: 14 % (ref 11.7–15.4)
WBC: 7.4 10*3/uL (ref 3.4–10.8)

## 2022-09-10 LAB — GLUCOSE TOLERANCE, 2 HOURS W/ 1HR
Glucose, 1 hour: 105 mg/dL (ref 70–179)
Glucose, 2 hour: 36 mg/dL — CL (ref 70–152)
Glucose, Fasting: 68 mg/dL — ABNORMAL LOW (ref 70–91)

## 2022-09-10 LAB — RPR: RPR Ser Ql: NONREACTIVE

## 2022-09-10 LAB — ANTIBODY SCREEN: Antibody Screen: NEGATIVE

## 2022-09-10 LAB — HIV ANTIBODY (ROUTINE TESTING W REFLEX): HIV Screen 4th Generation wRfx: NONREACTIVE

## 2022-09-17 NOTE — BH Specialist Note (Signed)
Integrated Behavioral Health via Telemedicine Visit  10/01/2022 Vickie Sutton [id number]  Number of [place] Clinician visits: 5-Fifth Visit  Session Start time: 4166   Session End time: 0630  Total time in minutes: 39   Referring Provider: Christin Fudge, CNM Patient/Family location: Home [facility] Provider location: [facility] [place] [facility]  All persons participating in visit: [name] and [place]   Types of Service: Individual psychotherapy and Video visit  I connected with [name] and/or [name]  n/a  via  Telephone or Video Enabled Telemedicine Application  (Video is Caregility application) and verified that I am speaking with the correct person using two identifiers. Discussed confidentiality: Yes   I discussed the limitations of telemedicine and the availability of in person appointments.  Discussed there is a possibility of technology failure and discussed alternative modes of communication if that failure occurs.  I discussed that engaging in this telemedicine visit, they consent to the provision of behavioral healthcare and the services will be billed under their insurance.  Patient and/or legal guardian expressed understanding and consented to Telemedicine visit: Yes   Presenting Concerns: Patient and/or family reports the following symptoms/concerns: Nervous, but excited about upcoming birth; coming to terms with FOB's lack of involvement. Goal is to be prepared as possible for time at hospital.  Duration of problem: Current pregnancy; Severity of problem: moderate  Patient and/or Family's Strengths/Protective Factors: Social connections, Social and Emotional competence, Concrete supports in place (healthy food, safe environments, etc.), Sense of purpose, and Physical Health (exercise, healthy diet, medication compliance, etc.)  Goals Addressed: Patient will:  Reduce  symptoms of: anxiety and stress    Demonstrate ability to: Increase motivation to adhere to plan of care  Progress towards Goals: Ongoing  Interventions: Interventions utilized:  Solution-Focused Strategies Standardized Assessments completed: Not Needed  Patient and/or Family Response: Patient agrees with treatment plan.   Assessment: Patient currently experiencing Anxiety disorder, unspecified .   Patient may benefit from continued therapeutic interventions .  Plan: Follow up with behavioral health clinician on : One month Behavioral recommendations:  -Continue taking Lexapro and prenatal vitamin daily as prescribed -Continue setting healthy boundaries for self and baby regarding FOB's involvement -Use checklist on After Visit Summary to help pack for trip to hospital  Referral(s): [place] (In Clinic)  I discussed the assessment and treatment plan with the patient and/or parent/guardian. They were provided an opportunity to ask questions and all were answered. They agreed with the plan and demonstrated an understanding of the instructions.   They were advised to call back or seek an in-person evaluation if the symptoms worsen or if the condition fails to improve as anticipated.  Caroleen Hamman Giovonni Poirier, LCSW

## 2022-09-27 ENCOUNTER — Inpatient Hospital Stay (HOSPITAL_COMMUNITY): Payer: Medicaid Other

## 2022-09-27 ENCOUNTER — Encounter (HOSPITAL_COMMUNITY): Payer: Self-pay | Admitting: Obstetrics and Gynecology

## 2022-09-27 ENCOUNTER — Inpatient Hospital Stay (HOSPITAL_COMMUNITY)
Admission: AD | Admit: 2022-09-27 | Discharge: 2022-09-27 | Disposition: A | Discharge status: Home / Self Care | Payer: Medicaid Other | Attending: Obstetrics and Gynecology | Admitting: Obstetrics and Gynecology

## 2022-09-27 ENCOUNTER — Telehealth: Payer: Self-pay | Admitting: *Deleted

## 2022-09-27 ENCOUNTER — Encounter: Payer: Medicaid Other | Admitting: Advanced Practice Midwife

## 2022-09-27 DIAGNOSIS — W109XXA Fall (on) (from) unspecified stairs and steps, initial encounter: Secondary | ICD-10-CM | POA: Diagnosis not present

## 2022-09-27 DIAGNOSIS — R55 Syncope and collapse: Secondary | ICD-10-CM | POA: Diagnosis not present

## 2022-09-27 DIAGNOSIS — Z3A30 30 weeks gestation of pregnancy: Secondary | ICD-10-CM | POA: Diagnosis not present

## 2022-09-27 DIAGNOSIS — O26893 Other specified pregnancy related conditions, third trimester: Secondary | ICD-10-CM | POA: Insufficient documentation

## 2022-09-27 DIAGNOSIS — Y939 Activity, unspecified: Secondary | ICD-10-CM | POA: Diagnosis not present

## 2022-09-27 DIAGNOSIS — Z3689 Encounter for other specified antenatal screening: Secondary | ICD-10-CM | POA: Diagnosis not present

## 2022-09-27 DIAGNOSIS — Y929 Unspecified place or not applicable: Secondary | ICD-10-CM | POA: Insufficient documentation

## 2022-09-27 DIAGNOSIS — M79672 Pain in left foot: Secondary | ICD-10-CM | POA: Diagnosis not present

## 2022-09-27 DIAGNOSIS — W19XXXA Unspecified fall, initial encounter: Secondary | ICD-10-CM | POA: Diagnosis not present

## 2022-09-27 HISTORY — DX: Syncope and collapse: R55

## 2022-09-27 MED ORDER — ACETAMINOPHEN 500 MG PO TABS
1000.0000 mg | ORAL_TABLET | Freq: Four times a day (QID) | ORAL | Status: DC | PRN
  Administered 2022-09-27: 1000 mg via ORAL
  Filled 2022-09-27: qty 2

## 2022-09-27 NOTE — Telephone Encounter (Signed)
Patient called stating she fell at home around 10:30 this morning on her left side. States she has an appointment this afternoon.   Advised patient to go to MAU for 4 hours of monitoring.  Appt rescheduled to Monday.

## 2022-09-27 NOTE — MAU Note (Addendum)
.  Vickie Sutton is a 21 y.o. at 4w4dhere in MAU reporting: at 10:30 she was going down some stairs and she had a syncopal episode and missed the last 2 steps. When she "woke up" she was on her left side on the floor. C/O pain in her left ankle  that shoots up her leg. Denies any abd pain or cramping but reports fetal movement is less than normal since she fell. Denies any vag bleeding or leaking at this time.  LMP:  Onset of complaint: 10:30 Pain score: 8 Vitals:   09/27/22 1350  BP: 94/60  Pulse: 74  Resp: 18  Temp: 98.2 F (36.8 C)     FHT:128 Lab orders placed from triage:

## 2022-09-27 NOTE — MAU Provider Note (Signed)
History     CSN: [id number]  Arrival date and time: 09/27/22 1332   Event Date/Time   First Provider Initiated Contact with Patient 09/27/22 1439      Chief Complaint  Patient presents with   Fall   21 y.o. G2P1001 '@30'$ .4 wks presenting after a fall. Reports having syncopal episode and falling down 2 steps. When she came to she was lying on her left side and had pain in her left foot. Reports her friend was walking with her at the time. Hx of POTS and vasovagal syncope and feels this was a similar episode. Denies SOB, CP, or dizziness. Foot pain is located in the top of her left foot and shoots up her leg when she walks. Denies swelling or bleeding. Denies ctx, abd pain, VB, or LOF. Report +FM but less since.     OB History     Gravida  2   Para  1   Term  1   Preterm      AB      Living  1      SAB      IAB      Ectopic      Multiple  0   Live Births  1           Past Medical History:  Diagnosis Date   ADD (attention deficit disorder)    Anxiety    Asthma    Depression    Migraine    Panic attacks    Polycystic ovarian syndrome    POTS (postural orthostatic tachycardia syndrome)    Syncope    Neurocardiogenic suspected   Vasovagal syncope     Past Surgical History:  Procedure Laterality Date   ESOPHAGOGASTRODUODENOSCOPY     LAPAROTOMY N/A 09/07/2020   Procedure: EXPLORATORY LAPAROTOMY WITH EXTRACTION  OVARIAN CYST;  Surgeon: Florian Buff, MD;  Location: [facility];  Service: Gynecology;  Laterality: N/A;   OVARIAN CYST SURGERY Right    ROUX-EN-Y GASTRIC BYPASS     August 2020 - [facility]   TOOTH EXTRACTION N/A 12/12/2015   Procedure: EXTRACTION MOLARS - teeth one, sixteen, seventeen and thirty-two;  Surgeon: Diona Browner, DDS;  Location: [facility];  Service: Oral Surgery;  Laterality: N/A;    Family History  Problem Relation Age of Onset   Diabetes Mellitus II Maternal Grandmother    Heart disease Maternal Grandmother    Obesity Mother    Yves Dill  Parkinson White syndrome Brother    Seizures Maternal Aunt    Pneumonia Son     Social History   Tobacco Use   Smoking status: Never   Smokeless tobacco: Never  Vaping Use   Vaping Use: Former  Substance Use Topics   Alcohol use: No   Drug use: No    Allergies: No Known Allergies  Medications Prior to Admission  Medication Sig Dispense Refill Last Dose   escitalopram (LEXAPRO) 20 MG tablet Take 20 mg by mouth daily.   09/27/2022   ferrous sulfate 300 (60 Fe) MG/5ML syrup Take 5 mLs (300 mg total) by mouth every other day. 150 mL 3 09/26/2022   Prenatal Vit-Fe Fumarate-FA (PRENATAL MULTIVITAMIN) TABS tablet Take 1 tablet by mouth daily at 12 noon.   09/27/2022   albuterol (VENTOLIN HFA) 108 (90 Base) MCG/ACT inhaler Inhale 2 puffs into the lungs as needed for wheezing or shortness of breath. (Patient not taking: Reported on 09/07/2022)      ondansetron (ZOFRAN-ODT) 4 MG disintegrating tablet Take  1 tablet (4 mg total) by mouth every 8 (eight) hours as needed for nausea or vomiting. (Patient not taking: Reported on 09/07/2022) 20 tablet 3     Review of Systems  Respiratory:  Negative for shortness of breath.   Gastrointestinal:  Negative for abdominal pain.  Genitourinary:  Negative for vaginal bleeding and vaginal discharge.  Musculoskeletal:        Lt foot pain  Neurological:  Positive for syncope. Negative for dizziness and light-headedness.   Physical Exam   Blood pressure (!) 98/55, pulse 68, temperature 98.2 F (36.8 C), resp. rate 18, last menstrual period 02/19/2022, not currently breastfeeding.  Physical Exam Vitals and nursing note reviewed.  Constitutional:      General: She is not in acute distress.    Appearance: Normal appearance.  HENT:     Head: Normocephalic and atraumatic.  Cardiovascular:     Rate and Rhythm: Normal rate.  Pulmonary:     Effort: Pulmonary effort is normal. No respiratory distress.  Abdominal:     Palpations: Abdomen is soft.      Tenderness: There is no abdominal tenderness.  Musculoskeletal:     Cervical back: Normal range of motion.     Left ankle: No swelling, deformity or ecchymosis. Decreased range of motion.     Left foot: Decreased range of motion. No swelling or deformity. Normal pulse.  Skin:    General: Skin is warm and dry.  Neurological:     General: No focal deficit present.     Mental Status: She is alert and oriented to person, place, and time.  Psychiatric:        Mood and Affect: Mood normal.        Behavior: Behavior normal.   EFM: 125 bpm, mod variability, + accels, no decels Toco: UI  No results found for this or any previous visit (from the past 24 hour(s)).  MAU Course  Procedures Prolonged EFM  MDM No signs of abruption or PTL. Pt feeling normal FM. No signs of fracture, left ankle brace applied by ortho tech. Advised to elevate and rest for a few days. Stable for discharge.  Assessment and Plan   1. [redacted] weeks gestation of pregnancy   2. NST (non-stress test) reactive   3. Fall, initial encounter   4. Syncope, unspecified syncope type    Discharge home Follow up at [facility] as scheduled Return precautions Tylenol prn  Allergies as of 09/27/2022   No Known Allergies      Medication List     TAKE these medications    albuterol 108 (90 Base) MCG/ACT inhaler Commonly known as: VENTOLIN HFA Inhale 2 puffs into the lungs as needed for wheezing or shortness of breath.   escitalopram 20 MG tablet Commonly known as: LEXAPRO Take 20 mg by mouth daily.   ferrous sulfate 300 (60 Fe) MG/5ML syrup Take 5 mLs (300 mg total) by mouth every other day.   ondansetron 4 MG disintegrating tablet Commonly known as: ZOFRAN-ODT Take 1 tablet (4 mg total) by mouth every 8 (eight) hours as needed for nausea or vomiting.   prenatal multivitamin Tabs tablet Take 1 tablet by mouth daily at 12 noon.         Julianne Handler, CNM 09/27/2022, 6:00 PM

## 2022-09-27 NOTE — Progress Notes (Signed)
Orthopedic Tech Progress Note Patient Details:  Vickie Sutton [birth year] [id number]  Ortho Devices Type of Ortho Device: ASO Ortho Device/Splint Location: LLE Ortho Device/Splint Interventions: Ordered, Application, Adjustment   Post Interventions Patient Tolerated: Well Instructions Provided: Care of device, Adjustment of device  Anecia Nusbaum Jeri Modena 09/27/2022, 6:31 PM

## 2022-10-01 ENCOUNTER — Ambulatory Visit (INDEPENDENT_AMBULATORY_CARE_PROVIDER_SITE_OTHER): Payer: Medicaid Other | Admitting: Obstetrics and Gynecology

## 2022-10-01 ENCOUNTER — Ambulatory Visit (INDEPENDENT_AMBULATORY_CARE_PROVIDER_SITE_OTHER): Payer: Medicaid Other | Admitting: Clinical

## 2022-10-01 ENCOUNTER — Encounter: Payer: Self-pay | Admitting: Obstetrics and Gynecology

## 2022-10-01 VITALS — BP 92/54 | HR 66 | Wt 167.0 lb

## 2022-10-01 DIAGNOSIS — F419 Anxiety disorder, unspecified: Secondary | ICD-10-CM

## 2022-10-01 DIAGNOSIS — Z23 Encounter for immunization: Secondary | ICD-10-CM | POA: Diagnosis not present

## 2022-10-01 DIAGNOSIS — Z3480 Encounter for supervision of other normal pregnancy, unspecified trimester: Secondary | ICD-10-CM

## 2022-10-01 DIAGNOSIS — N393 Stress incontinence (female) (male): Secondary | ICD-10-CM

## 2022-10-01 DIAGNOSIS — R8271 Bacteriuria: Secondary | ICD-10-CM

## 2022-10-01 DIAGNOSIS — Z3A31 31 weeks gestation of pregnancy: Secondary | ICD-10-CM

## 2022-10-01 NOTE — Patient Instructions (Signed)
[facility] [address] [place], [place] [address] [phone] (main office) [phone] (Kawena Lyday's office)   

## 2022-10-01 NOTE — Addendum Note (Signed)
Addended by: Jesusita Oka on: 10/01/2022 02:44 PM   Modules accepted: Orders

## 2022-10-01 NOTE — Progress Notes (Signed)
   PRENATAL VISIT NOTE  Subjective:  Vickie Sutton is a 21 y.o. G2P1001 at 32w1dbeing seen today for ongoing prenatal care.  She is currently monitored for the following issues for this low-risk pregnancy and has POTS (postural orthostatic tachycardia syndrome); Syncope; Asthma; History of Roux-en-Y gastric bypass; PCOS (polycystic ovarian syndrome); Anxiety; History of postpartum depression; Jerky body movements; Encounter for supervision of normal pregnancy, antepartum; and GBS bacteriuria on their problem list.  Patient reports  vaginal bleeding after intercourse that filled a pantiliner over the weekend.  .  Contractions: Irritability. Vag. Bleeding: Moderate.  Movement: Present. Denies leaking of fluid.   The following portions of the patient's history were reviewed and updated as appropriate: allergies, current medications, past family history, past medical history, past social history, past surgical history and problem list.   Objective:   Vitals:   10/01/22 1400  BP: (!) 92/54  Pulse: 66  Weight: 167 lb (75.8 kg)    Fetal Status: Fetal Heart Rate (bpm): 125 Fundal Height: 31 cm Movement: Present  Presentation: Vertex  General:  Alert, oriented and cooperative. Patient is in no acute distress.  Skin: Skin is warm and dry. No rash noted.   Cardiovascular: Normal heart rate noted  Respiratory: Normal respiratory effort, no problems with respiration noted  Abdomen: Soft, gravid, appropriate for gestational age.  Pain/Pressure: Present     Pelvic: Cervical exam performed in the presence of a chaperone Dilation: Closed Effacement (%): 50 Station: -3. Bladder tender, no blood in the vault  Extremities: Normal range of motion.  Edema: None  Mental Status: Normal mood and affect. Normal behavior. Normal judgment and thought content.   Assessment and Plan:  Pregnancy: G2P1001 at 326w1d. Supervision of other normal pregnancy, antepartum Tdap and flu shot given today Reviewed  reassuring findings from exam. Precautions reviewed. Will check UCX since bladder tender.   2. GBS bacteriuria PCN in labor  3. Anxiety Currently well controlled  Preterm labor symptoms and general obstetric precautions including but not limited to vaginal bleeding, contractions, leaking of fluid and fetal movement were reviewed in detail with the patient. Please refer to After Visit Summary for other counseling recommendations.   No follow-ups on file.  Future Appointments  Date Time Provider De[place]10/08/2022  4:00 PM [facility]  PaRadene GunningMD

## 2022-10-03 LAB — URINE CULTURE

## 2022-10-15 ENCOUNTER — Encounter: Payer: Self-pay | Admitting: Women's Health

## 2022-10-15 ENCOUNTER — Ambulatory Visit (INDEPENDENT_AMBULATORY_CARE_PROVIDER_SITE_OTHER): Payer: Medicaid Other | Admitting: Women's Health

## 2022-10-15 VITALS — BP 99/63 | HR 87 | Wt 171.8 lb

## 2022-10-15 DIAGNOSIS — Z3483 Encounter for supervision of other normal pregnancy, third trimester: Secondary | ICD-10-CM | POA: Diagnosis not present

## 2022-10-15 DIAGNOSIS — O368131 Decreased fetal movements, third trimester, fetus 1: Secondary | ICD-10-CM | POA: Diagnosis not present

## 2022-10-15 DIAGNOSIS — Z3A33 33 weeks gestation of pregnancy: Secondary | ICD-10-CM

## 2022-10-15 DIAGNOSIS — Z3480 Encounter for supervision of other normal pregnancy, unspecified trimester: Secondary | ICD-10-CM

## 2022-10-15 DIAGNOSIS — O368139 Decreased fetal movements, third trimester, other fetus: Secondary | ICD-10-CM | POA: Diagnosis not present

## 2022-10-15 NOTE — Progress Notes (Signed)
LOW-RISK PREGNANCY VISIT Patient name: Vickie Sutton MRN [id number]  Date of birth: [birth year] Chief Complaint:   Routine Prenatal Visit  History of Present Illness:   Vickie Sutton is a 21 y.o. G41P1001 female at 66w1dwith an Estimated Date of Delivery: 12/02/22 being seen today for ongoing management of a low-risk pregnancy.   Today she reports decreased fetal movement today. Contractions: Irritability. Vag. Bleeding: None.  Movement: (!) Decreased. denies leaking of fluid.     09/07/2022    9:05 AM 08/01/2022   11:01 AM 05/24/2022    9:30 AM 06/16/2021    9:44 AM 08/08/2020   10:33 AM  Depression screen PHQ 2/9  Decreased Interest 1 0 0 1 0  Down, Depressed, Hopeless 1 1 0 1 0  PHQ - 2 Score 2 1 0 2 0  Altered sleeping 1 1 0 0 0  Tired, decreased energy '1 3 2 2 2  '$ Change in appetite 0 0 0 0 0  Feeling bad or failure about yourself  0 0 0 0 0  Trouble concentrating 0 0 0 0 0  Moving slowly or fidgety/restless 0 1 0 0 0  Suicidal thoughts 0 0 0 0 0  PHQ-9 Score '4 6 2 4 2        '$ 09/07/2022    9:06 AM 08/01/2022   11:03 AM 05/24/2022    9:30 AM 06/16/2021    9:45 AM  GAD 7 : Generalized Anxiety Score  Nervous, Anxious, on Edge '1 2 1 1  '$ Control/stop worrying 1 0 0 0  Worry too much - different things '1 2 1 1  '$ Trouble relaxing 0 0 0 0  Restless 0 0 0 0  Easily annoyed or irritable '1 2 2 1  '$ Afraid - awful might happen 0 0 0 0  Total GAD 7 Score '4 6 4 3      '$ Review of Systems:   Pertinent items are noted in HPI Denies abnormal vaginal discharge w/ itching/odor/irritation, headaches, visual changes, shortness of breath, chest pain, abdominal pain, severe nausea/vomiting, or problems with urination or bowel movements unless otherwise stated above. Pertinent History Reviewed:  Reviewed past medical,surgical, social, obstetrical and family history.  Reviewed problem list, medications and allergies. Physical Assessment:   Vitals:   10/15/22 1454  BP: 99/63  Pulse: 87  Weight:  171 lb 12.8 oz (77.9 kg)  Body mass index is 28.59 kg/m.        Physical Examination:   General appearance: Well appearing, and in no distress  Mental status: Alert, oriented to person, place, and time  Skin: Warm & dry  Cardiovascular: Normal heart rate noted  Respiratory: Normal respiratory effort, no distress  Abdomen: Soft, gravid, nontender  Pelvic: Cervical exam deferred         Extremities: Edema: None  Fetal Status: Fetal Heart Rate (bpm): 125   Movement: (!) Decreased   NST: FHR baseline 125 bpm, Variability: moderate, Accelerations:present, Decelerations:  Absent= Cat 1/reactive Toco: UI   Chaperone: N/A   No results found for this or any previous visit (from the past 24 hour(s)).  Assessment & Plan:  1) Low-risk pregnancy G2P1001 at 360w1dith an Estimated Date of Delivery: 12/02/22   2) Decreased fm, NST reactive, reviewed [facility], reasons to seek care  3) POTS/vasovagal syncope> follwed by cards, plan f/u pp  4) Dep/anx> doing well on lexapro, continue visits w/ [facility]/[name]   Meds: No orders of the defined types were placed in this encounter.  Labs/procedures today: NST  Plan:  Continue routine obstetrical care  Next visit: prefers in person    Reviewed: Preterm labor symptoms and general obstetric precautions including but not limited to vaginal bleeding, contractions, leaking of fluid and fetal movement were reviewed in detail with the patient.  All questions were answered. Does have home bp cuff. Office bp cuff given: not applicable. Check bp weekly, let us know if consistently >140 and/or >90.  Follow-up: Return in about 2 weeks (around 10/29/2022) for [place], [place], in person.  Future Appointments  Date Time Provider [place]  10/29/2022  3:45 PM [place] [facility]    No orders of the defined types were placed in this encounter.  [place], [facility] 10/15/2022 3:48 PM

## 2022-10-15 NOTE — Patient Instructions (Signed)
Vickie Sutton, thank you for choosing our office today! We appreciate the opportunity to meet your healthcare needs. You may receive a short survey by mail, e-mail, or through MyChart. If you are happy with your care we would appreciate if you could take just a few minutes to complete the survey questions. We read all of your comments and take your feedback very seriously. Thank you again for choosing our office.  [facility] ([address] [place], [place] [address]) Entrance C, located off of [address] Free 24/7 valet parking   CLASSES: Go to [email or web address] to register for classes (childbirth, breastfeeding, waterbirth, infant CPR, daddy bootcamp, etc.)  Call the office ([phone]) or go to [facility] if: You begin to have strong, frequent contractions Your water breaks.  Sometimes it is a big gush of fluid, sometimes it is just a trickle that keeps getting your panties wet or running down your legs You have vaginal bleeding.  It is normal to have a small amount of spotting if your cervix was checked.  You don't feel your baby moving like normal.  If you don't, get you something to eat and drink and lay down and focus on feeling your baby move.   If your baby is still not moving like normal, you should call the office or go to [facility].  Call the office ([phone]) or go to [facility] for these signs of pre-eclampsia: Severe headache that does not go away with Tylenol Visual changes- seeing spots, double, blurred vision Pain under your right breast or upper abdomen that does not go away with Tums or heartburn medicine Nausea and/or vomiting Severe swelling in your hands, feet, and face   Tdap Vaccine It is recommended that you get the Tdap vaccine during the third trimester of EACH pregnancy to help protect your baby from getting pertussis (whooping cough) 27-36 weeks is the BEST time to do  this so that you can pass the protection on to your baby. During pregnancy is better than after pregnancy, but if you are unable to get it during pregnancy it will be offered at the hospital.  You can get this vaccine with us, at the health department, your family doctor, or some local pharmacies Everyone who will be around your baby should also be up-to-date on their vaccines before the baby comes. Adults (who are not pregnant) only need 1 dose of Tdap during adulthood.   [place] Pediatricians/Family Doctors [facility] ([facility]): [address], [phone]           [facility]: [address], [phone]                [facility] ([facility]): [address], [phone] (call to ask if accepting patients) [facility]: [address], [place], [phone]    [place] Pediatricians/Family Doctors [facility] ([facility]): [address], [phone] [facility]: [address], [phone] [facility]: [address], [phone]  [place] Family Doctors  [facility] ([facility]): [phone] [facility]: [address], [phone]   [place] Family Doctors [facility]: [address], [phone]  [place] [facility]  [facility]: [address], [phone]  Home Blood Pressure Monitoring for Patients   Your provider has recommended that you check your   blood pressure (BP) at least once a week at home. If you do not have a blood pressure cuff at home, one will be provided for you. Contact your provider if you have not received your monitor within 1 week.   Helpful Tips for Accurate Home Blood Pressure Checks  Don't smoke, exercise, or drink caffeine 30 minutes before checking your BP Use the restroom before checking your BP (a full bladder can raise your  pressure) Relax in a comfortable upright chair Feet on the ground Left arm resting comfortably on a flat surface at the level of your heart Legs uncrossed Back supported Sit quietly and don't talk Place the cuff on your bare arm Adjust snuggly, so that only two fingertips can fit between your skin and the top of the cuff Check 2 readings separated by at least one minute Keep a log of your BP readings For a visual, please reference this diagram: http://ccnc.care/bpdiagram  Provider Name: [facility]     Phone: [phone]  Zone 1: ALL CLEAR  Continue to monitor your symptoms:  BP reading is less than 140 (top number) or less than 90 (bottom number)  No right upper stomach pain No headaches or seeing spots No feeling nauseated or throwing up No swelling in face and hands  Zone 2: CAUTION Call your doctor's office for any of the following:  BP reading is greater than 140 (top number) or greater than 90 (bottom number)  Stomach pain under your ribs in the middle or right side Headaches or seeing spots Feeling nauseated or throwing up Swelling in face and hands  Zone 3: EMERGENCY  Seek immediate medical care if you have any of the following:  BP reading is greater than160 (top number) or greater than 110 (bottom number) Severe headaches not improving with Tylenol Serious difficulty catching your breath Any worsening symptoms from Zone 2  Preterm Labor and Birth Information  The normal length of a pregnancy is 39-41 weeks. Preterm labor is when labor starts before 37 completed weeks of pregnancy. What are the risk factors for preterm labor? Preterm labor is more likely to occur in women who: Have certain infections during pregnancy such as a bladder infection, sexually transmitted infection, or infection inside the uterus (chorioamnionitis). Have a shorter-than-normal cervix. Have gone into preterm labor before. Have had surgery on their cervix. Are younger than age 17  or older than age 35. Are African American. Are pregnant with twins or multiple babies (multiple gestation). Take street drugs or smoke while pregnant. Do not gain enough weight while pregnant. Became pregnant shortly after having been pregnant. What are the symptoms of preterm labor? Symptoms of preterm labor include: Cramps similar to those that can happen during a menstrual period. The cramps may happen with diarrhea. Pain in the abdomen or lower back. Regular uterine contractions that may feel like tightening of the abdomen. A feeling of increased pressure in the pelvis. Increased watery or bloody mucus discharge from the vagina. Water breaking (ruptured amniotic sac). Why is it important to recognize signs of preterm labor? It is important to recognize signs of preterm labor because babies who are born prematurely may not be fully developed. This can put them at an increased risk for: Long-term (chronic) heart and lung problems. Difficulty immediately after birth with regulating body systems, including blood sugar, body temperature, heart rate, and breathing rate. Bleeding in the brain. Cerebral palsy. Learning difficulties. Death. These risks are highest for babies who are born before 34 weeks   of pregnancy. How is preterm labor treated? Treatment depends on the length of your pregnancy, your condition, and the health of your baby. It may involve: Having a stitch (suture) placed in your cervix to prevent your cervix from opening too early (cerclage). Taking or being given medicines, such as: Hormone medicines. These may be given early in pregnancy to help support the pregnancy. Medicine to stop contractions. Medicines to help mature the baby's lungs. These may be prescribed if the risk of delivery is high. Medicines to prevent your baby from developing cerebral palsy. If the labor happens before 34 weeks of pregnancy, you may need to stay in the hospital. What should I do if I  think I am in preterm labor? If you think that you are going into preterm labor, call your health care provider right away. How can I prevent preterm labor in future pregnancies? To increase your chance of having a full-term pregnancy: Do not use any tobacco products, such as cigarettes, chewing tobacco, and e-cigarettes. If you need help quitting, ask your health care provider. Do not use street drugs or medicines that have not been prescribed to you during your pregnancy. Talk with your health care provider before taking any herbal supplements, even if you have been taking them regularly. Make sure you gain a healthy amount of weight during your pregnancy. Watch for infection. If you think that you might have an infection, get it checked right away. Make sure to tell your health care provider if you have gone into preterm labor before. This information is not intended to replace advice given to you by your health care provider. Make sure you discuss any questions you have with your health care provider. Document Revised: 04/03/2019 Document Reviewed: 05/02/2016 Elsevier Patient Education  2020 [employer]   

## 2022-10-15 NOTE — BH Specialist Note (Signed)
Integrated Behavioral Health via Telemedicine Visit  10/29/2022 Vickie Sutton [id number]  Number of [place] Clinician visits: 6-Sixth Visit  Session Start time: 1548   Session End time: 1628  Total time in minutes: 40   Referring Provider: Christin Fudge, CNM Patient/Family location: Home [facility] Provider location: [facility] [place] [facility]  All persons participating in visit: [name] and [place]   Types of Service: Individual psychotherapy and Video visit  I connected with [name] and/or [name]  n/a  via  Telephone or Video Enabled Telemedicine Application  (Video is Caregility application) and verified that I am speaking with the correct person using two identifiers. Discussed confidentiality: Yes   I discussed the limitations of telemedicine and the availability of in person appointments.  Discussed there is a possibility of technology failure and discussed alternative modes of communication if that failure occurs.  I discussed that engaging in this telemedicine visit, they consent to the provision of behavioral healthcare and the services will be billed under their insurance.  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  Duration of problem: Current pregnancy; Severity of problem: moderate  Patient and/or Family's Strengths/Protective Factors: Social connections, [place]  supports in place (healthy food, safe environments, etc.), Sense of purpose, and Physical Health (exercise, healthy diet, medication compliance, etc.)  Goals Addressed: Patient will:  Reduce symptoms of: anxiety and stress   Increase knowledge and/or ability of: stress reduction   Demonstrate ability to: Increase healthy adjustment to current life circumstances  Progress towards Goals: Ongoing  Interventions: Interventions utilized:  Supportive Reflection Standardized Assessments completed: Not Needed  Patient and/or Family Response: Patient agrees with treatment plan.   Assessment: Patient currently experiencing Anxiety disorder, unspecified.   Patient may benefit from continued therapeutic interventions.  Plan: Follow up with behavioral health clinician on : Two weeks for CCA Behavioral recommendations:  -Continue preparing for baby's arrival -Discuss medical concerns at tomorrow's medical appointment 10/30/22 - Referral(s): [place] (In Clinic)  I discussed the assessment and treatment plan with the patient and/or parent/guardian. They were provided an opportunity to ask questions and all were answered. They agreed with the plan and demonstrated an understanding of the instructions.   They were advised to call back or seek an in-person evaluation if the symptoms worsen or if the condition fails to improve as anticipated.  [place], LCSW     09/07/2022    9:05 AM 08/01/2022   11:01 AM 05/24/2022    9:30 AM 06/16/2021    9:44 AM 08/08/2020   10:33 AM  Depression screen PHQ 2/9  Decreased Interest 1 0 0 1 0  Down, Depressed, Hopeless 1 1 0 1 0  PHQ - 2 Score 2 1 0 2 0  Altered sleeping 1 1 0 0 0  Tired, decreased energy '1 3 2 2 2  '$ Change in appetite 0 0 0 0 0  Feeling bad or failure about yourself  0 0 0 0 0  Trouble concentrating 0 0 0 0 0  Moving slowly or fidgety/restless 0 1 0 0 0  Suicidal thoughts 0 0 0 0 0  PHQ-9 Score '4 6 2 4  2      '$ 09/07/2022    9:06 AM 08/01/2022   11:03 AM 05/24/2022    9:30 AM 06/16/2021    9:45 AM  GAD 7 : Generalized Anxiety Score  Nervous, Anxious, on Edge '1 2 1 1  '$ Control/stop worrying 1 0 0 0  Worry too much - different things '1 2 1 1  '$ Trouble relaxing 0 0 0 0  Restless 0 0 0 0  Easily annoyed or irritable '1 2 2 1  '$ Afraid - awful might happen 0 0 0 0  Total GAD 7 Score '4 6 4 '$ 3

## 2022-10-24 ENCOUNTER — Encounter: Payer: Self-pay | Admitting: Women's Health

## 2022-10-29 ENCOUNTER — Ambulatory Visit (INDEPENDENT_AMBULATORY_CARE_PROVIDER_SITE_OTHER): Payer: Medicaid Other | Admitting: Clinical

## 2022-10-29 DIAGNOSIS — F419 Anxiety disorder, unspecified: Secondary | ICD-10-CM | POA: Diagnosis not present

## 2022-10-30 ENCOUNTER — Encounter: Payer: Self-pay | Admitting: Women's Health

## 2022-10-30 ENCOUNTER — Ambulatory Visit (INDEPENDENT_AMBULATORY_CARE_PROVIDER_SITE_OTHER): Payer: Medicaid Other | Admitting: Women's Health

## 2022-10-30 VITALS — BP 96/51 | HR 71 | Wt 171.4 lb

## 2022-10-30 DIAGNOSIS — O26843 Uterine size-date discrepancy, third trimester: Secondary | ICD-10-CM

## 2022-10-30 DIAGNOSIS — Z3483 Encounter for supervision of other normal pregnancy, third trimester: Secondary | ICD-10-CM

## 2022-10-30 DIAGNOSIS — Z3A35 35 weeks gestation of pregnancy: Secondary | ICD-10-CM

## 2022-10-30 DIAGNOSIS — Z3480 Encounter for supervision of other normal pregnancy, unspecified trimester: Secondary | ICD-10-CM

## 2022-10-30 MED ORDER — CLINDAMYCIN HCL 300 MG PO CAPS: 300.0000 mg | ORAL_CAPSULE | Freq: Three times a day (TID) | ORAL | 0 refills | Status: DC

## 2022-10-30 NOTE — Patient Instructions (Addendum)
Verdis Frederickson, thank you for choosing our office today! We appreciate the opportunity to meet your healthcare needs. You may receive a short survey by mail, e-mail, or through [employer]. If you are happy with your care we would appreciate if you could take just a few minutes to complete the survey questions. We read all of your comments and take your feedback very seriously. Thank you again for choosing our office.  Center for [employer] Team at [place] at [facility] ([place], [place] [address]) Entrance C, located off of [address] parking   For your lower back pain you may: Purchase a pregnancy/maternity support belt from [employer], [employer], [employer], [employer], etc and wear it while you are up and about Take warm baths Use a heating pad to your lower back for no longer than 20 minutes at a time, and do not place near abdomen Take tylenol as needed. Please follow directions on the bottle Kinesiology tape (can get from sporting goods store), google how to tape belly for pregnancy   CLASSES: Go to [email or web address] to register for classes (childbirth, breastfeeding, waterbirth, infant CPR, daddy bootcamp, etc.)  Call the office [phone]) or go to [facility] if: You begin to have strong, frequent contractions Your water breaks.  Sometimes it is a big gush of fluid, sometimes it is just a trickle that keeps getting your panties wet or running down your legs You have vaginal bleeding.  It is normal to have a small amount of spotting if your cervix was checked.  You don't feel your baby moving like normal.  If you don't, get you something to eat and drink and lay down and focus on feeling your baby move.   If your baby is still not moving like normal, you should call the office or go to [facility].  Call the office [phone]) or go to [facility] hospital for these signs of pre-eclampsia: Severe headache that does not  go away with Tylenol Visual changes- seeing spots, double, blurred vision Pain under your right breast or upper abdomen that does not go away with Tums or heartburn medicine Nausea and/or vomiting Severe swelling in your hands, feet, and face   Tdap Vaccine It is recommended that you get the Tdap vaccine during the third trimester of EACH pregnancy to help protect your baby from getting pertussis (whooping cough) 27-36 weeks is the BEST time to do this so that you can pass the protection on to your baby. During pregnancy is better than after pregnancy, but if you are unable to get it during pregnancy it will be offered at the hospital.  You can get this vaccine with [place], at the health department, your family doctor, or some local pharmacies Everyone who will be around your baby should also be up-to-date on their vaccines before the baby comes. Adults (who are not pregnant) only need 1 dose of Tdap during adulthood.   [facility] Pediatricians/Family Doctors [facility]): [address] [place], [place] [facility]: [address] [place], [phone]                [facility]): [place], [phone] (call to ask if accepting patients) [facility] Department: [address], [place], [place] Pediatricians/Family Doctors [facility]): [place]. [place], [address], [phone]  [facility]: [address] [place], [facility]: [place], [place] [facility]): [phone] [facility]: [address], [place]: [address], [place] [facility]: [phone], [phone]  Home Blood Pressure Monitoring for Patients   Your  provider has recommended that you check your blood pressure (BP) at least once a week at home. If you do not have a blood pressure cuff at home, one will be provided for you. Contact your provider if you have not received your monitor within 1 week.   Helpful Tips for Accurate Home Blood Pressure Checks  Don't smoke, exercise, or drink caffeine 30 minutes before checking your BP Use the restroom before checking your BP (a full bladder can raise your pressure) Relax in a comfortable upright chair Feet on the ground Left arm resting comfortably on a flat surface at the level of your heart Legs uncrossed Back supported Sit quietly and don't talk Place the cuff on your bare arm Adjust snuggly, so that only two fingertips can fit between your skin and the top of the cuff Check 2 readings separated by at least one minute Keep a log of your BP readings For a visual, please reference this diagram: http://ccnc.care/bpdiagram  Provider Name: [facility]     Phone: [phone]  Zone 1: ALL CLEAR  Continue to monitor your symptoms:  BP reading is less than 140 (top number) or less than 90 (bottom number)  No right upper stomach pain No headaches or seeing spots No feeling nauseated or throwing up No swelling in face and hands  Zone 2: CAUTION Call your doctor's office for any of the following:  BP reading is greater than 140 (top number) or greater than 90 (bottom number)  Stomach pain under your ribs in the middle or right side Headaches or seeing spots Feeling nauseated or throwing up Swelling in face and hands  Zone 3: EMERGENCY  Seek immediate medical care if you have any of the following:  BP reading is greater than160 (top number) or greater than 110 (bottom number) Severe headaches not improving with Tylenol Serious difficulty catching your breath Any worsening symptoms from Zone 2  Preterm Labor and Birth Information  The normal length of a pregnancy is 39-41 weeks.  Preterm labor is when labor starts before 37 completed weeks of pregnancy. What are the risk factors for preterm labor? Preterm labor is more likely to occur in women who: Have certain infections during pregnancy such as a bladder infection, sexually transmitted infection, or infection inside the uterus (chorioamnionitis). Have a shorter-than-normal cervix. Have gone into preterm labor before. Have had surgery on their cervix. Are younger than age 65 or older than age 44. Are African American. Are pregnant with twins or multiple babies (multiple gestation). Take street drugs or smoke while pregnant. Do not gain enough weight while pregnant. Became pregnant shortly after having been pregnant. What are the symptoms of preterm labor? Symptoms of preterm labor include: Cramps similar to those that can happen during a menstrual period. The cramps may happen with diarrhea. Pain in the abdomen or lower back. Regular uterine contractions that may feel like tightening of the abdomen. A feeling of increased pressure in the pelvis. Increased watery or bloody mucus discharge from the vagina. Water breaking (ruptured amniotic sac). Why is it  important to recognize signs of preterm labor? It is important to recognize signs of preterm labor because babies who are born prematurely may not be fully developed. This can put them at an increased risk for: Long-term (chronic) heart and lung problems. Difficulty immediately after birth with regulating body systems, including blood sugar, body temperature, heart rate, and breathing rate. Bleeding in the brain. Cerebral palsy. Learning difficulties. Death. These risks are highest for babies who are born before 72 weeks of pregnancy. How is preterm labor treated? Treatment depends on the length of your pregnancy, your condition, and the health of your baby. It may involve: Having a stitch (suture) placed in your cervix to prevent your cervix from opening too  early (cerclage). Taking or being given medicines, such as: Hormone medicines. These may be given early in pregnancy to help support the pregnancy. Medicine to stop contractions. Medicines to help mature the baby's lungs. These may be prescribed if the risk of delivery is high. Medicines to prevent your baby from developing cerebral palsy. If the labor happens before 34 weeks of pregnancy, you may need to stay in the hospital. What should I do if I think I am in preterm labor? If you think that you are going into preterm labor, call your health care provider right away. How can I prevent preterm labor in future pregnancies? To increase your chance of having a full-term pregnancy: Do not use any tobacco products, such as cigarettes, chewing tobacco, and e-cigarettes. If you need help quitting, ask your health care provider. Do not use street drugs or medicines that have not been prescribed to you during your pregnancy. Talk with your health care provider before taking any herbal supplements, even if you have been taking them regularly. Make sure you gain a healthy amount of weight during your pregnancy. Watch for infection. If you think that you might have an infection, get it checked right away. Make sure to tell your health care provider if you have gone into preterm labor before. This information is not intended to replace advice given to you by your health care provider. Make sure you discuss any questions you have with your health care provider. Document Revised: 04/03/2019 Document Reviewed: 05/02/2016 Elsevier Patient Education  [place].

## 2022-10-30 NOTE — Progress Notes (Signed)
LOW-RISK PREGNANCY VISIT Patient name: Vickie Sutton MRN [id number]  Date of birth: [birth year] Chief Complaint:   Routine Prenatal Visit  History of Present Illness:   Vickie Sutton is a 21 y.o. G7P1001 female at 16w2dwith an Estimated Date of Delivery: 12/02/22 being seen today for ongoing management of a low-risk pregnancy.   Today she reports  bite on Rt abdomen x 3d, staying the same, red & painful. Low back pain. Had bleeding ~2wks ago, has resolved now. Some white d/c, no itching/odor/irritation.  Contractions: Not present. Vag. Bleeding: None.  Movement: Present. denies leaking of fluid.     09/07/2022    9:05 AM 08/01/2022   11:01 AM 05/24/2022    9:30 AM 06/16/2021    9:44 AM 08/08/2020   10:33 AM  Depression screen PHQ 2/9  Decreased Interest 1 0 0 1 0  Down, Depressed, Hopeless 1 1 0 1 0  PHQ - 2 Score 2 1 0 2 0  Altered sleeping 1 1 0 0 0  Tired, decreased energy '1 3 2 2 2  '$ Change in appetite 0 0 0 0 0  Feeling bad or failure about yourself  0 0 0 0 0  Trouble concentrating 0 0 0 0 0  Moving slowly or fidgety/restless 0 1 0 0 0  Suicidal thoughts 0 0 0 0 0  PHQ-9 Score '4 6 2 4 2        '$ 09/07/2022    9:06 AM 08/01/2022   11:03 AM 05/24/2022    9:30 AM 06/16/2021    9:45 AM  GAD 7 : Generalized Anxiety Score  Nervous, Anxious, on Edge '1 2 1 1  '$ Control/stop worrying 1 0 0 0  Worry too much - different things '1 2 1 1  '$ Trouble relaxing 0 0 0 0  Restless 0 0 0 0  Easily annoyed or irritable '1 2 2 1  '$ Afraid - awful might happen 0 0 0 0  Total GAD 7 Score '4 6 4 3      '$ Review of Systems:   Pertinent items are noted in HPI Denies abnormal vaginal discharge w/ itching/odor/irritation, headaches, visual changes, shortness of breath, chest pain, abdominal pain, severe nausea/vomiting, or problems with urination or bowel movements unless otherwise stated above. Pertinent History Reviewed:  Reviewed past medical,surgical, social, obstetrical and family history.  Reviewed  problem list, medications and allergies. Physical Assessment:   Vitals:   10/30/22 0901  BP: (!) 96/51  Pulse: 71  Weight: 171 lb 6.4 oz (77.7 kg)  Body mass index is 28.52 kg/m.        Physical Examination:   General appearance: Well appearing, and in no distress  Mental status: Alert, oriented to person, place, and time  Skin: Warm & dry  Cardiovascular: Normal heart rate noted  Respiratory: Normal respiratory effort, no distress  Abdomen: Soft, gravid, nontender, ?spider bite area (pic below) w/ some induration  Pelvic: Cervical exam deferred         Extremities: Edema: None   Fetal Status: Fetal Heart Rate (bpm): 152 Fundal Height: 29 cm Movement: Present    Chaperone: N/A   No results found for this or any previous visit (from the past 24 hour(s)).  Assessment & Plan:  1) Low-risk pregnancy G2P1001 at 38w2dith an Estimated Date of Delivery: 12/02/22   2) ?spider bite Rt abdomen x 3d, showed pic to Dr. EuElonda Huskypossible MRSA, rx cleocin x10d  3) Low back pain> gave printed info  4) POTS/vasovagal syncope> followed by cards, plan f/u pp  5) Dep/anx> doing well on lexapro, continue IBH appts  6) Uterine size <dates- will get EFW/AFI u/s   Meds:  Meds ordered this encounter  Medications   clindamycin (CLEOCIN) 300 MG capsule    Sig: Take 1 capsule (300 mg total) by mouth 3 (three) times daily. X 10 days    Dispense:  30 capsule    Refill:  0   Labs/procedures today: none  Plan:  Continue routine obstetrical care  Next visit: prefers will be in person for cultures     Reviewed: Preterm labor symptoms and general obstetric precautions including but not limited to vaginal bleeding, contractions, leaking of fluid and fetal movement were reviewed in detail with the patient.  All questions were answered. Does have home bp cuff. Office bp cuff given: not applicable. Check bp weekly, let us know if consistently >140 and/or >90.  Follow-up: Return for weekly, As  scheduled; , US:EFW ASAP (Gbso if needed).  Future Appointments  Date Time Provider [place]  11/01/2022  2:30 PM [facility] NURSE [facility]  11/01/2022  2:45 PM [facility] US7 [facility]  11/05/2022  8:50 AM Roma Schanz, CNM [facility]  11/12/2022  3:15 PM [place]  11/13/2022  8:50 AM Roma Schanz, CNM [facility]  11/20/2022  8:50 AM Roma Schanz, CNM [facility]  11/27/2022  8:50 AM Roma Schanz, CNM [facility]    Orders Placed This Encounter  Procedures   [place] MFM OB DETAIL +14 [place], [facility] 10/30/2022 12:05 PM

## 2022-10-30 NOTE — BH Specialist Note (Signed)
Error; Rescheduled due to two days postpartum

## 2022-11-01 ENCOUNTER — Ambulatory Visit: Payer: Medicaid Other | Admitting: *Deleted

## 2022-11-01 ENCOUNTER — Other Ambulatory Visit: Payer: Self-pay | Admitting: Women's Health

## 2022-11-01 ENCOUNTER — Ambulatory Visit: Payer: Medicaid Other | Attending: Women's Health

## 2022-11-01 VITALS — BP 104/58 | HR 73

## 2022-11-01 DIAGNOSIS — O26843 Uterine size-date discrepancy, third trimester: Secondary | ICD-10-CM

## 2022-11-01 DIAGNOSIS — Z3480 Encounter for supervision of other normal pregnancy, unspecified trimester: Secondary | ICD-10-CM

## 2022-11-01 DIAGNOSIS — R8271 Bacteriuria: Secondary | ICD-10-CM | POA: Insufficient documentation

## 2022-11-01 DIAGNOSIS — O365930 Maternal care for other known or suspected poor fetal growth, third trimester, not applicable or unspecified: Secondary | ICD-10-CM

## 2022-11-01 DIAGNOSIS — Z8759 Personal history of other complications of pregnancy, childbirth and the puerperium: Secondary | ICD-10-CM | POA: Insufficient documentation

## 2022-11-01 DIAGNOSIS — Z3A35 35 weeks gestation of pregnancy: Secondary | ICD-10-CM | POA: Diagnosis not present

## 2022-11-01 DIAGNOSIS — Z3483 Encounter for supervision of other normal pregnancy, third trimester: Secondary | ICD-10-CM

## 2022-11-01 DIAGNOSIS — O99843 Bariatric surgery status complicating pregnancy, third trimester: Secondary | ICD-10-CM | POA: Insufficient documentation

## 2022-11-01 DIAGNOSIS — Z8659 Personal history of other mental and behavioral disorders: Secondary | ICD-10-CM | POA: Insufficient documentation

## 2022-11-01 NOTE — Procedures (Signed)
Vickie Sutton [birth year] [redacted]w[redacted]d Fetus A Non-Stress Test Interpretation for 11/01/22  Indication: IUGR  Fetal Heart Rate A Mode: External Baseline Rate (A): 125 bpm Variability: Moderate Accelerations: 15 x 15 Decelerations: None Multiple birth?: No  Uterine Activity Mode: Palpation, Toco Contraction Frequency (min): occ Contraction Duration (sec): 40-60 Contraction Quality: Mild Resting Tone Palpated: Relaxed Resting Time: Adequate  Interpretation (Fetal Testing) Nonstress Test Interpretation: Reactive Overall Impression: Reassuring for gestational age Comments: Dr. FAnnamaria Bootsreviewed tracing

## 2022-11-02 ENCOUNTER — Other Ambulatory Visit: Payer: Medicaid Other

## 2022-11-02 ENCOUNTER — Other Ambulatory Visit: Payer: Self-pay | Admitting: *Deleted

## 2022-11-02 DIAGNOSIS — Z9884 Bariatric surgery status: Secondary | ICD-10-CM

## 2022-11-02 DIAGNOSIS — G90A Postural orthostatic tachycardia syndrome (POTS): Secondary | ICD-10-CM

## 2022-11-02 DIAGNOSIS — O365930 Maternal care for other known or suspected poor fetal growth, third trimester, not applicable or unspecified: Secondary | ICD-10-CM

## 2022-11-05 ENCOUNTER — Encounter: Payer: Self-pay | Admitting: Women's Health

## 2022-11-05 ENCOUNTER — Ambulatory Visit (INDEPENDENT_AMBULATORY_CARE_PROVIDER_SITE_OTHER): Payer: Medicaid Other | Admitting: Women's Health

## 2022-11-05 ENCOUNTER — Other Ambulatory Visit (HOSPITAL_COMMUNITY)
Admission: RE | Admit: 2022-11-05 | Discharge: 2022-11-05 | Disposition: A | Discharge status: Home / Self Care | Payer: Medicaid Other | Source: Ambulatory Visit | Attending: Women's Health | Admitting: Women's Health

## 2022-11-05 ENCOUNTER — Other Ambulatory Visit: Payer: Medicaid Other

## 2022-11-05 VITALS — BP 90/54 | HR 63 | Wt 136.3 lb

## 2022-11-05 DIAGNOSIS — O0993 Supervision of high risk pregnancy, unspecified, third trimester: Secondary | ICD-10-CM | POA: Diagnosis not present

## 2022-11-05 DIAGNOSIS — Z3A36 36 weeks gestation of pregnancy: Secondary | ICD-10-CM

## 2022-11-05 DIAGNOSIS — Z3493 Encounter for supervision of normal pregnancy, unspecified, third trimester: Secondary | ICD-10-CM | POA: Insufficient documentation

## 2022-11-05 DIAGNOSIS — O365990 Maternal care for other known or suspected poor fetal growth, unspecified trimester, not applicable or unspecified: Secondary | ICD-10-CM | POA: Insufficient documentation

## 2022-11-05 DIAGNOSIS — O365930 Maternal care for other known or suspected poor fetal growth, third trimester, not applicable or unspecified: Secondary | ICD-10-CM | POA: Diagnosis not present

## 2022-11-05 DIAGNOSIS — Z3480 Encounter for supervision of other normal pregnancy, unspecified trimester: Secondary | ICD-10-CM

## 2022-11-05 NOTE — Progress Notes (Signed)
HIGH-RISK PREGNANCY VISIT Patient name: Vickie Sutton MRN [id number]  Date of birth: December 10, 2001 Chief Complaint:   Routine Prenatal Visit and Non-stress Test (culture)  History of Present Illness:   Phuong Hillary is a 21 y.o. G49P1001 female at 19w1dwith an Estimated Date of Delivery: 12/02/22 being seen today for ongoing management of a high-risk pregnancy complicated by fetal growth restriction 6% dx last week w/ MFM, UAD wnl.    Today she reports  had rough sex last night, some light pink spotting this am. Frequent contractions yesterday, drank water and layed down and eased off. Some mild cramping this am. . Contractions: Not present. Vag. Bleeding: None.  Movement: Present. denies leaking of fluid.      09/07/2022    9:05 AM 08/01/2022   11:01 AM 05/24/2022    9:30 AM 06/16/2021    9:44 AM 08/08/2020   10:33 AM  Depression screen PHQ 2/9  Decreased Interest 1 0 0 1 0  Down, Depressed, Hopeless 1 1 0 1 0  PHQ - 2 Score 2 1 0 2 0  Altered sleeping 1 1 0 0 0  Tired, decreased energy '1 3 2 2 2  '$ Change in appetite 0 0 0 0 0  Feeling bad or failure about yourself  0 0 0 0 0  Trouble concentrating 0 0 0 0 0  Moving slowly or fidgety/restless 0 1 0 0 0  Suicidal thoughts 0 0 0 0 0  PHQ-9 Score '4 6 2 4 2        '$ 09/07/2022    9:06 AM 08/01/2022   11:03 AM 05/24/2022    9:30 AM 06/16/2021    9:45 AM  GAD 7 : Generalized Anxiety Score  Nervous, Anxious, on Edge '1 2 1 1  '$ Control/stop worrying 1 0 0 0  Worry too much - different things '1 2 1 1  '$ Trouble relaxing 0 0 0 0  Restless 0 0 0 0  Easily annoyed or irritable '1 2 2 1  '$ Afraid - awful might happen 0 0 0 0  Total GAD 7 Score '4 6 4 3     '$ Review of Systems:   Pertinent items are noted in HPI Denies abnormal vaginal discharge w/ itching/odor/irritation, headaches, visual changes, shortness of breath, chest pain, abdominal pain, severe nausea/vomiting, or problems with urination or bowel movements unless otherwise stated above. Pertinent  History Reviewed:  Reviewed past medical,surgical, social, obstetrical and family history.  Reviewed problem list, medications and allergies. Physical Assessment:   Vitals:   11/05/22 0837  BP: (!) 90/54  Pulse: 63  Weight: 136 lb 4.8 oz (61.8 kg)  Body mass index is 22.68 kg/m.           Physical Examination:   General appearance: alert, well appearing, and in no distress  Mental status: alert, oriented to person, place, and time  Skin: warm & dry   Extremities: Edema: None    Cardiovascular: normal heart rate noted  Respiratory: normal respiratory effort, no distress  Abdomen: gravid, soft, non-tender, resolving ?MRSA boil vs spider bite abdomen- looks much better  Pelvic: Cervical exam performed  Dilation: 3 Effacement (%): 50 Station: Ballotable  Fetal Status:     Movement: Present    Fetal Surveillance Testing today: NST: FHR baseline 120 bpm, Variability: moderate, Accelerations:present, Decelerations:  Absent= Cat 1/reactive Toco: UI    Chaperone: JLevy Pupa   No results found for this or any previous visit (from the past 24 hour(s)).  Assessment & Plan:  High-risk pregnancy: G2P1001 at 43w1dwith an Estimated Date of Delivery: 12/02/22   1) FGR 6% w/ normal UAD, dx last week w/ MFM, BPP was 8/10 (-2 for absent fetal breathing), took long time for movement/tone. Per MFM note, recommended delivery 37-38wks. Reactive NST today, has BPP/dopp w/ MFM on Wed. Pt wishes to push induction off as long as possible as long as testing wnl. Can discuss more w/ MFM on Wed.    2) Resolving ? MRSA boil vs spider bite, looks much better, finish up clindamycin this week  Meds: No orders of the defined types were placed in this encounter.   Labs/procedures today: GBS, GC/CT, SVE, and NST  Treatment Plan:  2x/wk testing  bpp/dopp alternating w/ NST, IOL per MFM   Reviewed: Preterm labor symptoms and general obstetric precautions including but not limited to vaginal bleeding,  contractions, leaking of fluid and fetal movement were reviewed in detail with the patient.  All questions were answered. Does have home bp cuff. Office bp cuff given: not applicable. Check bp weekly, let u[place]ore[place]now if consistently >140 and/or >90.  Follow-up: Return for move 11/21 appt to 11/23 HROB MD/CNM w/ NST please.   Future Appointments  Date Time Provider [place] 11/07/2022  2:30 PM [facility] 11/07/2022  2:45 PM [facility] US[facility] 11/12/2022 12:30 PM [facility] 11[facility]12/2022 12:45 PM [facility] 11/12/2022  3:15 PM [facility] 11/13/2022  8:50 AM BRoma Schanz CNM [facility]  11/20/2022  8:50 AM BRoma Schanz CNM [facility]  11/27/2022  8:50 AM BRoma Schanz CNM [facility]    Orders Placed This Encounter  Procedures   Culture, beta strep (group b only)   KRoma SchanzCNM, [facility]11/13/2023 9:19 AM

## 2022-11-05 NOTE — Patient Instructions (Signed)
Vickie Sutton, thank you for choosing our office today! We appreciate the opportunity to meet your healthcare needs. You may receive a short survey by mail, e-mail, or through [employer]. If you are happy with your care we would appreciate if you could take just a few minutes to complete the survey questions. We read all of your comments and take your feedback very seriously. Thank you again for choosing our office.  Center for [employer] Team at [place] at [facility] ([place], [place] [address]) Entrance C, located off of [address] parking   CLASSES: Go to [email or web address] to register for classes (childbirth, breastfeeding, waterbirth, infant CPR, daddy bootcamp, etc.)  Call the office [phone]) or go to [facility] if: You begin to have strong, frequent contractions Your water breaks.  Sometimes it is a big gush of fluid, sometimes it is just a trickle that keeps getting your panties wet or running down your legs You have vaginal bleeding.  It is normal to have a small amount of spotting if your cervix was checked.  You don't feel your baby moving like normal.  If you don't, get you something to eat and drink and lay down and focus on feeling your baby move.   If your baby is still not moving like normal, you should call the office or go to [facility].  Call the office [phone]) or go to [facility] hospital for these signs of pre-eclampsia: Severe headache that does not go away with Tylenol Visual changes- seeing spots, double, blurred vision Pain under your right breast or upper abdomen that does not go away with Tums or heartburn medicine Nausea and/or vomiting Severe swelling in your hands, feet, and face   Tdap Vaccine It is recommended that you get the Tdap vaccine during the third trimester of EACH pregnancy to help protect your baby from getting pertussis (whooping cough) 27-36 weeks is the BEST time to do  this so that you can pass the protection on to your baby. During pregnancy is better than after pregnancy, but if you are unable to get it during pregnancy it will be offered at the hospital.  You can get this vaccine with [place], at the health department, your family doctor, or some local pharmacies Everyone who will be around your baby should also be up-to-date on their vaccines before the baby comes. Adults (who are not pregnant) only need 1 dose of Tdap during adulthood.   [facility] Pediatricians/Family Doctors [facility]): [address] [place], [place] [facility]: [address] [place], [phone]                [facility]): [place], [phone] (call to ask if accepting patients) [facility] Department: [address], [place], [place] Pediatricians/Family Doctors [facility]): [place] [place], [address], [place] [facility]: [address] [place], [place] [facility]: [place], [place] [facility]): [phone] [facility]: [address] [place]: [address], [place] [facility]: [phone], [phone]  Home Blood Pressure Monitoring for Patients   Your provider has recommended that you check your  blood pressure (BP) at least once a week at home. If you do not have a blood pressure cuff at home, one will be provided for you. Contact your provider if you have not received your monitor within 1 week.   Helpful Tips for Accurate Home Blood Pressure Checks  Don't smoke, exercise, or drink caffeine 30 minutes before checking your BP Use the restroom before checking your BP (a full bladder can raise your  pressure) Relax in a comfortable upright chair Feet on the ground Left arm resting comfortably on a flat surface at the level of your heart Legs uncrossed Back supported Sit quietly and don't talk Place the cuff on your bare arm Adjust snuggly, so that only two fingertips can fit between your skin and the top of the cuff Check 2 readings separated by at least one minute Keep a log of your BP readings For a visual, please reference this diagram: http://ccnc.care/bpdiagram  Provider Name: [facility]     Phone: [phone]  Zone 1: ALL CLEAR  Continue to monitor your symptoms:  BP reading is less than 140 (top number) or less than 90 (bottom number)  No right upper stomach pain No headaches or seeing spots No feeling nauseated or throwing up No swelling in face and hands  Zone 2: CAUTION Call your doctor's office for any of the following:  BP reading is greater than 140 (top number) or greater than 90 (bottom number)  Stomach pain under your ribs in the middle or right side Headaches or seeing spots Feeling nauseated or throwing up Swelling in face and hands  Zone 3: EMERGENCY  Seek immediate medical care if you have any of the following:  BP reading is greater than160 (top number) or greater than 110 (bottom number) Severe headaches not improving with Tylenol Serious difficulty catching your breath Any worsening symptoms from Zone 2  Preterm Labor and Birth Information  The normal length of a pregnancy is 39-41 weeks. Preterm labor is when labor starts before 37 completed weeks of pregnancy. What are the risk factors for preterm labor? Preterm labor is more likely to occur in women who: Have certain infections during pregnancy such as a bladder infection, sexually transmitted infection, or infection inside the uterus (chorioamnionitis). Have a shorter-than-normal cervix. Have gone into preterm labor before. Have had surgery on their cervix. Are younger than age 17  or older than age 35. Are African American. Are pregnant with twins or multiple babies (multiple gestation). Take street drugs or smoke while pregnant. Do not gain enough weight while pregnant. Became pregnant shortly after having been pregnant. What are the symptoms of preterm labor? Symptoms of preterm labor include: Cramps similar to those that can happen during a menstrual period. The cramps may happen with diarrhea. Pain in the abdomen or lower back. Regular uterine contractions that may feel like tightening of the abdomen. A feeling of increased pressure in the pelvis. Increased watery or bloody mucus discharge from the vagina. Water breaking (ruptured amniotic sac). Why is it important to recognize signs of preterm labor? It is important to recognize signs of preterm labor because babies who are born prematurely may not be fully developed. This can put them at an increased risk for: Long-term (chronic) heart and lung problems. Difficulty immediately after birth with regulating body systems, including blood sugar, body temperature, heart rate, and breathing rate. Bleeding in the brain. Cerebral palsy. Learning difficulties. Death. These risks are highest for babies who are born before 34 weeks   of pregnancy. How is preterm labor treated? Treatment depends on the length of your pregnancy, your condition, and the health of your baby. It may involve: Having a stitch (suture) placed in your cervix to prevent your cervix from opening too early (cerclage). Taking or being given medicines, such as: Hormone medicines. These may be given early in pregnancy to help support the pregnancy. Medicine to stop contractions. Medicines to help mature the baby's lungs. These may be prescribed if the risk of delivery is high. Medicines to prevent your baby from developing cerebral palsy. If the labor happens before 34 weeks of pregnancy, you may need to stay in the hospital. What should I do if I  think I am in preterm labor? If you think that you are going into preterm labor, call your health care provider right away. How can I prevent preterm labor in future pregnancies? To increase your chance of having a full-term pregnancy: Do not use any tobacco products, such as cigarettes, chewing tobacco, and e-cigarettes. If you need help quitting, ask your health care provider. Do not use street drugs or medicines that have not been prescribed to you during your pregnancy. Talk with your health care provider before taking any herbal supplements, even if you have been taking them regularly. Make sure you gain a healthy amount of weight during your pregnancy. Watch for infection. If you think that you might have an infection, get it checked right away. Make sure to tell your health care provider if you have gone into preterm labor before. This information is not intended to replace advice given to you by your health care provider. Make sure you discuss any questions you have with your health care provider. Document Revised: 04/03/2019 Document Reviewed: 05/02/2016 Elsevier Patient Education  2020 Elsevier Inc.   

## 2022-11-06 LAB — CERVICOVAGINAL ANCILLARY ONLY
Chlamydia: NEGATIVE
Comment: NEGATIVE
Comment: NORMAL
Neisseria Gonorrhea: NEGATIVE

## 2022-11-07 ENCOUNTER — Other Ambulatory Visit: Payer: Self-pay | Admitting: Obstetrics

## 2022-11-07 ENCOUNTER — Ambulatory Visit: Payer: Medicaid Other | Attending: Obstetrics and Gynecology

## 2022-11-07 ENCOUNTER — Ambulatory Visit (HOSPITAL_BASED_OUTPATIENT_CLINIC_OR_DEPARTMENT_OTHER): Payer: Medicaid Other | Admitting: *Deleted

## 2022-11-07 ENCOUNTER — Ambulatory Visit: Payer: Medicaid Other | Admitting: *Deleted

## 2022-11-07 ENCOUNTER — Other Ambulatory Visit: Payer: Self-pay

## 2022-11-07 ENCOUNTER — Inpatient Hospital Stay (HOSPITAL_COMMUNITY)
Admission: AD | Admit: 2022-11-07 | Discharge: 2022-11-07 | Disposition: A | Discharge status: Home / Self Care | Payer: Medicaid Other | Attending: Obstetrics and Gynecology | Admitting: Obstetrics and Gynecology

## 2022-11-07 ENCOUNTER — Encounter (HOSPITAL_COMMUNITY): Payer: Self-pay | Admitting: Obstetrics and Gynecology

## 2022-11-07 VITALS — BP 115/70 | HR 75

## 2022-11-07 DIAGNOSIS — O283 Abnormal ultrasonic finding on antenatal screening of mother: Secondary | ICD-10-CM | POA: Diagnosis not present

## 2022-11-07 DIAGNOSIS — Z9884 Bariatric surgery status: Secondary | ICD-10-CM

## 2022-11-07 DIAGNOSIS — Z8659 Personal history of other mental and behavioral disorders: Secondary | ICD-10-CM | POA: Diagnosis present

## 2022-11-07 DIAGNOSIS — Z3A36 36 weeks gestation of pregnancy: Secondary | ICD-10-CM | POA: Insufficient documentation

## 2022-11-07 DIAGNOSIS — G90A Postural orthostatic tachycardia syndrome (POTS): Secondary | ICD-10-CM | POA: Diagnosis not present

## 2022-11-07 DIAGNOSIS — O0993 Supervision of high risk pregnancy, unspecified, third trimester: Secondary | ICD-10-CM | POA: Insufficient documentation

## 2022-11-07 DIAGNOSIS — O479 False labor, unspecified: Secondary | ICD-10-CM

## 2022-11-07 DIAGNOSIS — O4703 False labor before 37 completed weeks of gestation, third trimester: Secondary | ICD-10-CM | POA: Insufficient documentation

## 2022-11-07 DIAGNOSIS — O365930 Maternal care for other known or suspected poor fetal growth, third trimester, not applicable or unspecified: Secondary | ICD-10-CM

## 2022-11-07 DIAGNOSIS — O26843 Uterine size-date discrepancy, third trimester: Secondary | ICD-10-CM

## 2022-11-07 DIAGNOSIS — Z8759 Personal history of other complications of pregnancy, childbirth and the puerperium: Secondary | ICD-10-CM | POA: Insufficient documentation

## 2022-11-07 DIAGNOSIS — O99353 Diseases of the nervous system complicating pregnancy, third trimester: Secondary | ICD-10-CM | POA: Diagnosis not present

## 2022-11-07 DIAGNOSIS — O99843 Bariatric surgery status complicating pregnancy, third trimester: Secondary | ICD-10-CM | POA: Diagnosis not present

## 2022-11-07 DIAGNOSIS — R8271 Bacteriuria: Secondary | ICD-10-CM

## 2022-11-07 NOTE — Procedures (Signed)
Vickie Sutton 05/07/2001 [redacted]w[redacted]d Fetus A Non-Stress Test Interpretation for 11/07/22  Indication: Unsatisfactory BPP  Fetal Heart Rate A Mode: External Baseline Rate (A): 130 bpm Variability: Moderate Accelerations: 15 x 15 Decelerations: Variable Multiple birth?: No  Uterine Activity Mode: Palpation, Toco Contraction Frequency (min): 2-5 with ui Contraction Duration (sec): 40-60 Contraction Quality: Mild Resting Tone Palpated: Relaxed Resting Time: Adequate  Interpretation (Fetal Testing) Nonstress Test Interpretation: Reactive Overall Impression: Reassuring for gestational age Comments: Dr. BGertie Exonreviewed tracing

## 2022-11-07 NOTE — MAU Provider Note (Signed)
Vickie Sutton is a 21 y.o. G13P1001 female at [redacted]w[redacted]d RN Labor check, not seen by provider, but being evaluated via NST in MAU due to having ctx during her BPP today (8/8) SVE by RN: Dilation: 3 Effacement (%): 50 Station: Ballotable Exam by:: weston,rn (unchanged over 2 exams) NST: FHR baseline 120s bpm, Variability: moderate, Accelerations:present, Decelerations:  Absent= Cat 1/Reactive Toco: irreg  D/C home F/U MFM on 11/12/22 as scheduled, or sooner prn  KMyrtis SerCNM 11/07/2022 7:06 PM

## 2022-11-07 NOTE — MAU Note (Signed)
Vickie Sutton is a 21 y.o. at 53w3dhere in MAU reporting[facility] was having regular ctxs on monitor during NST.  Denies VB or LOF.  Endorses +FM, but less than usual.   Last cervical check was 3cms LMP: NA Onset of complaint: today Pain score: 6 Vitals:   11/07/22 1653  BP: 109/65  Pulse: 68  Resp: 17  Temp: 98.1 F (36.7 C)  SpO2: 100%     FHT: 126 bpm Lab orders placed from triage:   None

## 2022-11-08 ENCOUNTER — Telehealth: Payer: Self-pay

## 2022-11-08 ENCOUNTER — Other Ambulatory Visit: Payer: Self-pay | Admitting: *Deleted

## 2022-11-08 DIAGNOSIS — O365990 Maternal care for other known or suspected poor fetal growth, unspecified trimester, not applicable or unspecified: Secondary | ICD-10-CM

## 2022-11-08 NOTE — Telephone Encounter (Signed)
Called patient to schedule patient for (2) weeks from 11/15 - patients voice mailbox was full - could not leave a message

## 2022-11-09 LAB — CULTURE, BETA STREP (GROUP B ONLY): Strep Gp B Culture: NEGATIVE

## 2022-11-10 ENCOUNTER — Inpatient Hospital Stay (HOSPITAL_COMMUNITY): Payer: Medicaid Other | Admitting: Anesthesiology

## 2022-11-10 ENCOUNTER — Encounter (HOSPITAL_COMMUNITY): Payer: Self-pay | Admitting: Obstetrics and Gynecology

## 2022-11-10 ENCOUNTER — Other Ambulatory Visit: Payer: Self-pay

## 2022-11-10 ENCOUNTER — Inpatient Hospital Stay (HOSPITAL_COMMUNITY)
Admission: AD | Admit: 2022-11-10 | Discharge: 2022-11-11 | DRG: 807 | Disposition: A | Discharge status: Home / Self Care | Payer: Medicaid Other | Attending: Obstetrics & Gynecology | Admitting: Obstetrics & Gynecology

## 2022-11-10 DIAGNOSIS — O99824 Streptococcus B carrier state complicating childbirth: Secondary | ICD-10-CM | POA: Diagnosis present

## 2022-11-10 DIAGNOSIS — O0993 Supervision of high risk pregnancy, unspecified, third trimester: Secondary | ICD-10-CM

## 2022-11-10 DIAGNOSIS — O99344 Other mental disorders complicating childbirth: Secondary | ICD-10-CM | POA: Diagnosis present

## 2022-11-10 DIAGNOSIS — O365930 Maternal care for other known or suspected poor fetal growth, third trimester, not applicable or unspecified: Secondary | ICD-10-CM | POA: Diagnosis present

## 2022-11-10 DIAGNOSIS — F419 Anxiety disorder, unspecified: Secondary | ICD-10-CM | POA: Diagnosis present

## 2022-11-10 DIAGNOSIS — O365990 Maternal care for other known or suspected poor fetal growth, unspecified trimester, not applicable or unspecified: Secondary | ICD-10-CM | POA: Diagnosis present

## 2022-11-10 DIAGNOSIS — O9902 Anemia complicating childbirth: Secondary | ICD-10-CM | POA: Diagnosis present

## 2022-11-10 DIAGNOSIS — O99820 Streptococcus B carrier state complicating pregnancy: Secondary | ICD-10-CM | POA: Diagnosis not present

## 2022-11-10 DIAGNOSIS — O99844 Bariatric surgery status complicating childbirth: Secondary | ICD-10-CM | POA: Diagnosis present

## 2022-11-10 DIAGNOSIS — Z9884 Bariatric surgery status: Secondary | ICD-10-CM

## 2022-11-10 DIAGNOSIS — Z3A36 36 weeks gestation of pregnancy: Secondary | ICD-10-CM

## 2022-11-10 DIAGNOSIS — R8271 Bacteriuria: Secondary | ICD-10-CM | POA: Diagnosis present

## 2022-11-10 DIAGNOSIS — Z8659 Personal history of other mental and behavioral disorders: Secondary | ICD-10-CM

## 2022-11-10 LAB — CBC
HCT: 30.5 % — ABNORMAL LOW (ref 36.0–46.0)
Hemoglobin: 9.3 g/dL — ABNORMAL LOW (ref 12.0–15.0)
MCH: 23.7 pg — ABNORMAL LOW (ref 26.0–34.0)
MCHC: 30.5 g/dL (ref 30.0–36.0)
MCV: 77.6 fL — ABNORMAL LOW (ref 80.0–100.0)
Platelets: 244 10*3/uL (ref 150–400)
RBC: 3.93 MIL/uL (ref 3.87–5.11)
RDW: 14.6 % (ref 11.5–15.5)
WBC: 10.1 10*3/uL (ref 4.0–10.5)
nRBC: 0 % (ref 0.0–0.2)

## 2022-11-10 LAB — TYPE AND SCREEN
ABO/RH(D): B POS
Antibody Screen: NEGATIVE

## 2022-11-10 LAB — RPR: RPR Ser Ql: NONREACTIVE

## 2022-11-10 LAB — POCT FERN TEST: POCT Fern Test: NEGATIVE

## 2022-11-10 MED ORDER — SODIUM CHLORIDE 0.9 % IV SOLN
1.0000 g | INTRAVENOUS | Status: DC
  Administered 2022-11-10: 1 g via INTRAVENOUS
  Filled 2022-11-10: qty 1000

## 2022-11-10 MED ORDER — LIDOCAINE HCL (PF) 1 % IJ SOLN: 30.0000 mL | INTRAMUSCULAR | Status: DC | PRN

## 2022-11-10 MED ORDER — SIMETHICONE 80 MG PO CHEW: 80.0000 mg | CHEWABLE_TABLET | ORAL | Status: DC | PRN

## 2022-11-10 MED ORDER — TETANUS-DIPHTH-ACELL PERTUSSIS 5-2.5-18.5 LF-MCG/0.5 IM SUSY: 0.5000 mL | PREFILLED_SYRINGE | Freq: Once | INTRAMUSCULAR | Status: DC

## 2022-11-10 MED ORDER — SENNOSIDES-DOCUSATE SODIUM 8.6-50 MG PO TABS
2.0000 | ORAL_TABLET | Freq: Every day | ORAL | Status: DC
  Administered 2022-11-11: 2 via ORAL
  Filled 2022-11-10: qty 2

## 2022-11-10 MED ORDER — WITCH HAZEL-GLYCERIN EX PADS: 1.0000 | MEDICATED_PAD | CUTANEOUS | Status: DC | PRN

## 2022-11-10 MED ORDER — BENZOCAINE-MENTHOL 20-0.5 % EX AERO
1.0000 | INHALATION_SPRAY | CUTANEOUS | Status: DC | PRN
  Administered 2022-11-10: 1 via TOPICAL
  Filled 2022-11-10: qty 56

## 2022-11-10 MED ORDER — DIBUCAINE (PERIANAL) 1 % EX OINT: 1.0000 | TOPICAL_OINTMENT | CUTANEOUS | Status: DC | PRN

## 2022-11-10 MED ORDER — ONDANSETRON HCL 4 MG/2ML IJ SOLN: 4.0000 mg | Freq: Four times a day (QID) | INTRAMUSCULAR | Status: DC | PRN

## 2022-11-10 MED ORDER — LIDOCAINE HCL (PF) 1 % IJ SOLN
INTRAMUSCULAR | Status: DC | PRN
  Administered 2022-11-10: 10 mL via EPIDURAL

## 2022-11-10 MED ORDER — ESCITALOPRAM OXALATE 10 MG PO TABS
20.0000 mg | ORAL_TABLET | Freq: Every day | ORAL | Status: DC
  Administered 2022-11-10 – 2022-11-11 (×2): 20 mg via ORAL
  Filled 2022-11-10 (×2): qty 2

## 2022-11-10 MED ORDER — ONDANSETRON HCL 4 MG/2ML IJ SOLN: 4.0000 mg | INTRAMUSCULAR | Status: DC | PRN

## 2022-11-10 MED ORDER — OXYCODONE HCL 5 MG PO TABS: 10.0000 mg | ORAL_TABLET | ORAL | Status: DC | PRN

## 2022-11-10 MED ORDER — ACETAMINOPHEN 325 MG PO TABS: 650.0000 mg | ORAL_TABLET | ORAL | Status: DC | PRN

## 2022-11-10 MED ORDER — ACETAMINOPHEN 325 MG PO TABS
650.0000 mg | ORAL_TABLET | ORAL | Status: DC | PRN
  Administered 2022-11-11: 650 mg via ORAL
  Filled 2022-11-10 (×2): qty 2

## 2022-11-10 MED ORDER — OXYCODONE-ACETAMINOPHEN 5-325 MG PO TABS: 1.0000 | ORAL_TABLET | ORAL | Status: DC | PRN

## 2022-11-10 MED ORDER — EPHEDRINE 5 MG/ML INJ: 10.0000 mg | INTRAVENOUS | Status: DC | PRN

## 2022-11-10 MED ORDER — SOD CITRATE-CITRIC ACID 500-334 MG/5ML PO SOLN: 30.0000 mL | ORAL | Status: DC | PRN

## 2022-11-10 MED ORDER — COCONUT OIL OIL: 1.0000 | TOPICAL_OIL | Status: DC | PRN

## 2022-11-10 MED ORDER — OXYCODONE HCL 5 MG PO TABS: 5.0000 mg | ORAL_TABLET | ORAL | Status: DC | PRN

## 2022-11-10 MED ORDER — SODIUM CHLORIDE 0.9 % IV SOLN
2.0000 g | Freq: Once | INTRAVENOUS | Status: AC
  Administered 2022-11-10: 2 g via INTRAVENOUS
  Filled 2022-11-10: qty 2000

## 2022-11-10 MED ORDER — DIPHENHYDRAMINE HCL 50 MG/ML IJ SOLN: 12.5000 mg | INTRAMUSCULAR | Status: DC | PRN

## 2022-11-10 MED ORDER — OXYTOCIN-SODIUM CHLORIDE 30-0.9 UT/500ML-% IV SOLN
2.5000 [IU]/h | INTRAVENOUS | Status: DC
  Filled 2022-11-10: qty 500

## 2022-11-10 MED ORDER — LACTATED RINGERS IV SOLN
500.0000 mL | INTRAVENOUS | Status: DC | PRN
  Administered 2022-11-10: 500 mL via INTRAVENOUS

## 2022-11-10 MED ORDER — OXYTOCIN BOLUS FROM INFUSION
333.0000 mL | Freq: Once | INTRAVENOUS | Status: AC
  Administered 2022-11-10: 333 mL via INTRAVENOUS

## 2022-11-10 MED ORDER — ONDANSETRON HCL 4 MG PO TABS: 4.0000 mg | ORAL_TABLET | ORAL | Status: DC | PRN

## 2022-11-10 MED ORDER — OXYCODONE-ACETAMINOPHEN 5-325 MG PO TABS: 2.0000 | ORAL_TABLET | ORAL | Status: DC | PRN

## 2022-11-10 MED ORDER — FENTANYL-BUPIVACAINE-NACL 0.5-0.125-0.9 MG/250ML-% EP SOLN
12.0000 mL/h | EPIDURAL | Status: DC | PRN
  Administered 2022-11-10: 12 mL/h via EPIDURAL
  Filled 2022-11-10: qty 250

## 2022-11-10 MED ORDER — PHENYLEPHRINE 80 MCG/ML (10ML) SYRINGE FOR IV PUSH (FOR BLOOD PRESSURE SUPPORT): 80.0000 ug | PREFILLED_SYRINGE | INTRAVENOUS | Status: DC | PRN

## 2022-11-10 MED ORDER — KETOROLAC TROMETHAMINE 30 MG/ML IJ SOLN: 30.0000 mg | Freq: Four times a day (QID) | INTRAMUSCULAR | Status: DC | PRN

## 2022-11-10 MED ORDER — LACTATED RINGERS IV SOLN
500.0000 mL | Freq: Once | INTRAVENOUS | Status: AC
  Administered 2022-11-10: 500 mL via INTRAVENOUS

## 2022-11-10 MED ORDER — LACTATED RINGERS IV SOLN: INTRAVENOUS | Status: DC

## 2022-11-10 MED ORDER — DIPHENHYDRAMINE HCL 25 MG PO CAPS: 25.0000 mg | ORAL_CAPSULE | Freq: Four times a day (QID) | ORAL | Status: DC | PRN

## 2022-11-10 MED ORDER — PHENYLEPHRINE 80 MCG/ML (10ML) SYRINGE FOR IV PUSH (FOR BLOOD PRESSURE SUPPORT)
80.0000 ug | PREFILLED_SYRINGE | INTRAVENOUS | Status: DC | PRN
  Filled 2022-11-10: qty 10

## 2022-11-10 MED ORDER — FENTANYL CITRATE (PF) 100 MCG/2ML IJ SOLN: 50.0000 ug | INTRAMUSCULAR | Status: DC | PRN

## 2022-11-10 MED ORDER — PRENATAL MULTIVITAMIN CH
1.0000 | ORAL_TABLET | Freq: Every day | ORAL | Status: DC
  Administered 2022-11-10 – 2022-11-11 (×2): 1 via ORAL
  Filled 2022-11-10 (×2): qty 1

## 2022-11-10 NOTE — H&P (Cosign Needed Addendum)
OBSTETRIC ADMISSION HISTORY AND PHYSICAL  Vickie Sutton is a 21 y.o. female G2P1001 with IUP at 52w6dby 7w UKoreapresenting for SOL. She is having painful regular contractions. She reports +FMs, No LOF, no VB, no blurry vision, headaches or peripheral edema, and RUQ pain.  She plans on bottle feeding. She request postpartum IUD for birth control.  [facility]  Dating: By 7w [place]--->  Estimated Date of Delivery: 12/02/22  Sono:   '@[redacted]w[redacted]d'$ , IUGR, normal anatomy, cephalic presentation, anterior placenta, 2195g, 6% EFW   Prenatal History/Complications:  -IUGR: 6%ile w/ normal UAD '@35w'$  -GBS bacteriuria -Asthma -H/o PPD and anxiety on lexapro  Past Medical History: Past Medical History:  Diagnosis Date   ADD (attention deficit disorder)    Anxiety    Asthma    Depression    Migraine    Panic attacks    Polycystic ovarian syndrome    POTS (postural orthostatic tachycardia syndrome)    Syncope    Neurocardiogenic suspected   Vasovagal syncope    Past Surgical History: Past Surgical History:  Procedure Laterality Date   ESOPHAGOGASTRODUODENOSCOPY     LAPAROTOMY N/A 09/07/2020   Procedure: EXPLORATORY LAPAROTOMY WITH EXTRACTION  OVARIAN CYST;  Surgeon: EFlorian Buff MD;  [place]  Service: Gynecology;  Laterality: N/A;   OVARIAN CYST SURGERY Right    ROUX-EN-Y GASTRIC BYPASS     August 2020 - [facility]   TOOTH EXTRACTION N/A 12/12/2015   Procedure: EXTRACTION MOLARS - teeth one, sixteen, seventeen and thirty-two;  Surgeon: SDiona Browner DDS;  [place]  Service: Oral Surgery;  Laterality: N/A;   Obstetrical History: OB History     Gravida  2   Para  1   Term  1   Preterm      AB      Living  1      SAB      IAB      Ectopic      Multiple  0   Live Births  1          Social History Social History   Socioeconomic History   Marital status: Significant Other    Spouse name: Not on file   Number of children: 1    Years of education: some college   Highest education level: Not on file  Occupational History   Occupation: nChartered certified accountantat [employer] Tobacco Use   Smoking status: Never   Smokeless tobacco: Never  Vaping Use   Vaping Use: Former  Substance and Sexual Activity   Alcohol use: No   Drug use: No   Sexual activity: Yes    Birth control/protection: None  Other Topics Concern   Not on file  Social History Narrative   Right-handed.   No daily caffeine.   Lives at home with boyfriend and son.    Social Determinants of Health   Financial Resource Strain: Low Risk  (09/07/2022)   Overall Financial Resource Strain (CARDIA)    Difficulty of Paying Living Expenses: Not very hard  Food Insecurity: No Food Insecurity (09/07/2022)   Hunger Vital Sign    Worried About Running Out of Food in the Last Year: Never true    Ran Out of Food in the Last Year: Never true  Transportation Needs: No Transportation Needs (09/07/2022)   PRAPARE - THydrologist(Medical): No    Lack of Transportation (Non-Medical): No  Physical  Activity: Insufficiently Active (09/07/2022)   Exercise Vital Sign    Days of Exercise per Week: 3 days    Minutes of Exercise per Session: 20 min  Stress: Stress Concern Present (09/07/2022)   [place]    Feeling of Stress : To some extent  Social Connections: Moderately Isolated (09/07/2022)   Social Connection and Isolation Panel [NHANES]    Frequency of Communication with Friends and Family: More than three times a week    Frequency of Social Gatherings with Friends and Family: Twice a week    Attends Religious Services: 1 to 4 times per year    Active Member of [employer] or Organizations: No    Attends Music therapist: Never    Marital Status: Never married   Family History: Family History  Problem Relation Age of Onset   Diabetes Mellitus II Maternal Grandmother     Heart disease Maternal Grandmother    Obesity Mother    Yves Dill Parkinson White syndrome Brother    Seizures Maternal Aunt    Pneumonia Son    Allergies: No Known Allergies  Medications Prior to Admission  Medication Sig Dispense Refill Last Dose   escitalopram (LEXAPRO) 20 MG tablet Take 20 mg by mouth daily.   11/09/2022   ferrous sulfate 300 (60 Fe) MG/5ML syrup Take 5 mLs (300 mg total) by mouth every other day. 150 mL 3 11/09/2022   Prenatal Vit-Fe Fumarate-FA (PRENATAL MULTIVITAMIN) TABS tablet Take 1 tablet by mouth daily at 12 noon.   11/09/2022   albuterol (VENTOLIN HFA) 108 (90 Base) MCG/ACT inhaler Inhale 2 puffs into the lungs as needed for wheezing or shortness of breath. (Patient not taking: Reported on 09/07/2022)   More than a month   clindamycin (CLEOCIN) 300 MG capsule Take 1 capsule (300 mg total) by mouth 3 (three) times daily. X 10 days 30 capsule 0     Review of Systems   All systems reviewed and negative except as stated in HPI  Blood pressure 115/67, pulse 65, temperature 98 F (36.7 C), resp. rate 20, last menstrual period 02/19/2022, SpO2 100 %, not currently breastfeeding. General appearance: alert, cooperative, and moderate distress Lungs: clear to auscultation bilaterally Heart: regular rate and rhythm Abdomen: soft, non-tender; bowel sounds normal Pelvic: adequate Extremities: Homans sign is negative, no sign of DVT Presentation: cephalic Fetal monitoringBaseline: 135 bpm, Variability: moderate, Accelerations: +accels, and Decelerations: Absent Uterine activityFrequency: Every 2-3 minutes  Dilation: 6.5 Effacement (%): 90 Station: -2 Exam by:: Gavin Pound, CNM  Prenatal labs: ABO, Rh: B/Positive/-- (06/01 1116) Antibody: Negative (09/15 4158) Rubella: 7.79 (06/01 1116) RPR: Non Reactive (09/15 0838)  HBsAg: Negative (06/01 1116)  HIV: Non Reactive (09/15 0838)  GBS: Negative/-- (11/13 1000)  1 hr Glucola normal Genetic screening   negative Anatomy US normal  Prenatal Transfer Tool  Maternal Diabetes: No Genetic Screening: Normal Maternal Ultrasounds/Referrals: IUGR Fetal Ultrasounds or other Referrals:  None Maternal Substance Abuse:  No Significant Maternal Medications:  None Significant Maternal Lab Results:  Group B Strep positive Number of Prenatal Visits:greater than 3 verified prenatal visits Other Comments:  None  No results found for this or any previous visit (from the past 24 hour(s)).  Patient Active Problem List   Diagnosis Date Noted   IUGR (intrauterine growth restriction) affecting care of mother 11/05/2022   SUI (stress urinary incontinence, female) 10/01/2022   GBS bacteriuria 06/13/2022   Encounter for supervision of high risk pregnancy in  third trimester, antepartum 05/23/2022   Jerky body movements 02/26/2022   History of postpartum depression 03/17/2021   History of Roux-en-Y gastric bypass 08/08/2020   PCOS (polycystic ovarian syndrome) 08/08/2020   Anxiety 08/08/2020   Syncope 07/25/2020   POTS (postural orthostatic tachycardia syndrome) 05/03/2020   Asthma 03/23/2019    Assessment/Plan:  Vickie Sutton is a 21 y.o. G2P1001 at 42w6dhere for SOL.  #Labor: presented to MAU with advanced dilation to 6-7cm. Plan expectant management and AROM when able.  #Pain: Epidural #FWB: Cat I #ID:  GBS bacteriuria, Amp given advanced dilation.  #MOF: Bottle #MOC: Postpartum IUD #Circ:  Yes  DRebecca Eaton MD  PGY3 Family Medicine Resident [facility]11/18/2023, 5:01 AM  Fellow Attestation  I saw and evaluated the patient, performing the key elements of the service.I  personally performed or re-performed the history, physical exam, and medical decision making activities of this service and have verified that the service and findings are accurately documented in the resident's note. I developed the management plan that is described in the resident's note, and I agree with the  content, with my edits above.    VGifford Shave MD OB Fellow

## 2022-11-10 NOTE — Discharge Summary (Shared)
Postpartum Discharge Summary  Date of Service updated***     Patient Name: Vickie Sutton DOB: 07-14-2001 MRN: [id number]  Date of admission: 11/10/2022 Delivery date:11/10/2022  Delivering provider: Stormy Card  Date of discharge: 11/10/2022  Admitting diagnosis: Normal labor [O80, Z37.9] Intrauterine pregnancy: [redacted]w[redacted]d    Secondary diagnosis:  Principal Problem:   SVD (spontaneous vaginal delivery) Active Problems:   History of Roux-en-Y gastric bypass   History of postpartum depression   Encounter for supervision of high risk pregnancy in third trimester, antepartum   GBS bacteriuria   IUGR (intrauterine growth restriction) affecting care of mother  Additional problems: ***    Discharge diagnosis: Preterm Pregnancy Delivered                                              Post partum procedures:{Postpartum procedures:23558} Augmentation:  none Complications: None  Hospital course: Onset of Labor With Vaginal Delivery      21y.o. yo G2P1001 at 345w6das admitted in Active Labor on 11/10/2022. Labor course was uncomplicated. Membrane Rupture Time/Date: 9:58 AM ,11/10/2022   Delivery Method:Vaginal, Spontaneous  Episiotomy: None  Lacerations:  Labial  abrasion. No repair indicated Patient had a postpartum course complicated by ***.  She is ambulating, tolerating a regular diet, passing flatus, and urinating well. Patient is discharged home in stable condition on 11/10/22.  Newborn Data: Birth date:11/10/2022  Birth time:11:38 AM  Gender:Female  Living status:Living  Apgars:7 ,9  Weight:   Magnesium Sulfate received: No BMZ received: No Rhophylac:N/A MMR:N/A. Rubella Immune T-DaP:Given prenatally Flu: {F{XIH:03888}ransfusion:{Transfusion received:30440034}  Physical exam  Vitals:   11/10/22 1151 11/10/22 1201 11/10/22 1203 11/10/22 1216  BP:   (!) 96/59 98/63  Pulse:   87 66  Resp: _0 Temp:      TempSrc:      SpO2:      Weight:       Height:       General: {Exam; general:21111117} Lochia: {Desc; appropriate/inappropriate:30686::"appropriate"} Uterine Fundus: {Desc; firm/soft:30687} Incision: {Exam; incision:21111123} DVT Evaluation: {Exam; dvt:2111122} Labs: Lab Results  Component Value Date   WBC 10.1 11/10/2022   HGB 9.3 (L) 11/10/2022   HCT 30.5 (L) 11/10/2022   MCV 77.6 (L) 11/10/2022   PLT 244 11/10/2022      Latest Ref Rng & Units 07/11/2022   12:17 PM  CMP  Glucose 70 - 99 mg/dL 71   BUN 6 - 20 mg/dL 11   Creatinine 0.44 - 1.00 mg/dL 0.50   Sodium 135 - 145 mmol/L 134   Potassium 3.5 - 5.1 mmol/L 3.5   Chloride 98 - 111 mmol/L 106   CO2 22 - 32 mmol/L 18   Calcium 8.9 - 10.3 mg/dL 9.1    Edinburgh Score:    03/17/2021   11:35 AM  Edinburgh Postnatal Depression Scale Screening Tool  I have been able to laugh and see the funny side of things. 1  I have looked forward with enjoyment to things. 1  I have blamed myself unnecessarily when things went wrong. 1  I have been anxious or worried for no good reason. 2  I have felt scared or panicky for no good reason. 0  Things have been getting on top of me. 2  I have been so unhappy that I have had difficulty sleeping. 1  I  have felt sad or miserable. 2  I have been so unhappy that I have been crying. 1  The thought of harming myself has occurred to me. 1  Edinburgh Postnatal Depression Scale Total 12     After visit meds:  Allergies as of 11/10/2022   No Known Allergies   Med Rec must be completed prior to using this [facility]***        Discharge home in stable condition Infant Feeding: {Baby feeding:23562} Infant Disposition:{CHL IP OB HOME WITH OECXFQ:72257} Discharge instruction: per After Visit Summary and Postpartum booklet. Activity: Advance as tolerated. Pelvic rest for 6 weeks.  Diet: {OB DYNX:83358251} Future Appointments: Future Appointments  Date Time Provider [place]  11/12/2022 12:30 PM [facility] NURSE  [facility]  11/12/2022 12:45 PM [facility] US4 [facility]  11/12/2022  3:15 PM [facility]  11/13/2022  8:50 AM Roma Schanz, CNM [facility]  11/20/2022  8:50 AM Roma Schanz, CNM [facility]  11/22/2022 12:30 PM [facility] NURSE [facility]  11/22/2022 12:45 PM [facility] US7 [facility]  11/27/2022  8:50 AM Roma Schanz, CNM [facility]  11/28/2022  1:30 PM [facility] NURSE [facility]  11/28/2022  1:45 PM [facility] US6 [facility] [place]   Follow up Visit:  The following message was sent to FT by Mikki Santee, MD  Please schedule this patient for a In person postpartum visit in 6 weeks with the following provider: Any provider. Additional Postpartum F/U:Postpartum Depression checkup in 2 weks High risk pregnancy complicated by:  IUGR, history of postpartum depression/anxiety Delivery mode:  Vaginal, Spontaneous  Anticipated Birth Control:  IUD outpatient.   11/10/2022 Layah Skousen Sherrilyn Rist, MD

## 2022-11-10 NOTE — Anesthesia Preprocedure Evaluation (Signed)
Anesthesia Evaluation  Patient identified by MRN, date of birth, ID band Patient awake    Reviewed: Allergy & Precautions, H&P , Patient's Chart, lab work & pertinent test results  Airway Mallampati: I       Dental no notable dental hx.    Pulmonary asthma    Pulmonary exam normal        Cardiovascular negative cardio ROS Normal cardiovascular exam     Neuro/Psych  Headaches PSYCHIATRIC DISORDERS Anxiety Depression       GI/Hepatic negative GI ROS, Neg liver ROS,,,  Endo/Other  negative endocrine ROS    Renal/GU negative Renal ROS  negative genitourinary   Musculoskeletal negative musculoskeletal ROS (+)    Abdominal Normal abdominal exam  (+)   Peds  Hematology negative hematology ROS (+)   Anesthesia Other Findings   Reproductive/Obstetrics (+) Pregnancy                             Anesthesia Physical Anesthesia Plan  ASA: II  Anesthesia Plan: Epidural   Post-op Pain Management:    Induction:   PONV Risk Score and Plan:   Airway Management Planned:   Additional Equipment:   Intra-op Plan:   Post-operative Plan:   Informed Consent: I have reviewed the patients History and Physical, chart, labs and discussed the procedure including the risks, benefits and alternatives for the proposed anesthesia with the patient or authorized representative who has indicated his/her understanding and acceptance.       Plan Discussed with:   Anesthesia Plan Comments:         Anesthesia Quick Evaluation

## 2022-11-10 NOTE — MAU Provider Note (Signed)
None      S: [name] is a 21 y.o. G2P1001 at [redacted]w[redacted]d who presents to MAU today complaining contractions 0100. She denies vaginal bleeding. She  is unsure of  LOF. She reports normal fetal movement.    O: BP 115/67 (BP Location: Right Arm)   Pulse 65   Temp 98 F (36.7 C)   Resp 20   LMP 02/19/2022 (Exact Date)   SpO2 100%  GENERAL: Well-developed, well-nourished female in no acute distress.  HEAD: Normocephalic, atraumatic.  CHEST: Normal effort of breathing, regular heart rate ABDOMEN: Soft, nontender, gravid  Cervical exam:  Dilation: 6.5 Effacement (%): 90 Cervical Position: Middle Station: -2 Presentation: Vertex Exam by:: JGavin Pound CNM   Fetal Monitoring: Baseline: 125 Variability: Moderate Accelerations: Present Decelerations: None Contractions: Q253m   A: SIUP at 3633w6dctive labor  P: Call L&D Team for admission orders.   EmlGavin PoundNM 11/10/2022 4:54 AM

## 2022-11-10 NOTE — MAU Note (Signed)
.  Vickie Sutton is a 21 y.o. at 30w6dhere in MAU reporting: ctx since 0140 with pressure. Pt reports feeling leaking watery mucus in MAU lobby. Pt reports DFM for last hr. Pt denies VB, PIH s/s. Last intercourse last night. GBS neg HSV denies SVE 3 cm on Monday   Onset of complaint: 0140 Pain score: 9/10 Vitals:   11/10/22 0435 11/10/22 0440  BP: 115/67   Pulse: 65   Resp: 20   Temp: 98 F (36.7 C)   SpO2: 100% 100%     FHT:125 Lab orders placed from triage:

## 2022-11-10 NOTE — Progress Notes (Addendum)
Vickie Sutton was referred for history of depression/anxiety and panic attacks. * Referral screened out by Clinical Social Worker because none of the following criteria appear to apply: ~ History of anxiety/depression during this pregnancy, or of post-partum depression following prior delivery. ~ Diagnosis of anxiety and/or depression within last 3 years OR * Vickie Sutton's symptoms currently being treated with medication and/or therapy. Vickie Sutton is prescribed escitalopram '20mg'$ /day. Please contact the Clinical Social Worker if needs arise, by [facility] request, or if Vickie Sutton scores greater than 9/yes to question 10 on Edinburgh Postpartum Depression Screen.  Signed,  Berniece Salines, MSW, LCSWA, LCASA 11/10/2022 6:12 PM

## 2022-11-10 NOTE — Anesthesia Procedure Notes (Signed)
Epidural Patient location during procedure: OB Start time: 11/10/2022 6:25 AM End time: 11/10/2022 6:29 AM  Staffing Anesthesiologist: Lyn Hollingshead, MD Performed: anesthesiologist   Preanesthetic Checklist Completed: patient identified, IV checked, site marked, risks and benefits discussed, surgical consent, monitors and equipment checked, pre-op evaluation and timeout performed  Epidural Patient position: sitting Prep: DuraPrep and site prepped and draped Patient monitoring: continuous pulse ox and blood pressure Approach: midline Location: L3-L4 Injection technique: LOR air  Needle:  Needle type: Tuohy  Needle gauge: 17 G Needle length: 9 cm and 9 Needle insertion depth: 6 cm Catheter type: closed end flexible Catheter size: 19 Gauge Catheter at skin depth: 11 cm Test dose: negative and Other  Assessment Events: blood not aspirated, injection not painful, no injection resistance, no paresthesia and negative IV test  Additional Notes Reason for block:procedure for pain

## 2022-11-11 LAB — CBC
HCT: 23.9 % — ABNORMAL LOW (ref 36.0–46.0)
Hemoglobin: 7.2 g/dL — ABNORMAL LOW (ref 12.0–15.0)
MCH: 23 pg — ABNORMAL LOW (ref 26.0–34.0)
MCHC: 30.1 g/dL (ref 30.0–36.0)
MCV: 76.4 fL — ABNORMAL LOW (ref 80.0–100.0)
Platelets: 189 10*3/uL (ref 150–400)
RBC: 3.13 MIL/uL — ABNORMAL LOW (ref 3.87–5.11)
RDW: 14.6 % (ref 11.5–15.5)
WBC: 13.7 10*3/uL — ABNORMAL HIGH (ref 4.0–10.5)
nRBC: 0 % (ref 0.0–0.2)

## 2022-11-11 MED ORDER — SODIUM CHLORIDE 0.9 % IV SOLN
500.0000 mg | Freq: Once | INTRAVENOUS | Status: AC
  Administered 2022-11-11: 500 mg via INTRAVENOUS
  Filled 2022-11-11: qty 500

## 2022-11-11 NOTE — Transfer of Care (Signed)
Immediate Anesthesia Transfer of Care Note  Patient: Vickie Sutton  Procedure(s) Performed: AN AD [place]  Patient Location: PACU and Mother/Baby  Anesthesia Type:Epidural  Level of Consciousness: awake, alert , oriented, and patient cooperative  Airway & Oxygen Therapy: Patient Spontanous Breathing  Post-op Assessment: Post -op Vital signs reviewed and stable  Post vital signs: Reviewed and stable  Last Vitals:  Vitals Value Taken Time  BP    Temp    Pulse    Resp    SpO2      Last Pain:  Vitals:   11/11/22 0628  TempSrc:   PainSc: 0-No pain      Patients Stated Pain Goal: 0 (14/70/92 9574)  Complications: No notable events documented.

## 2022-11-11 NOTE — Anesthesia Postprocedure Evaluation (Signed)
Anesthesia Post Note  Patient: [name]  Procedure(s) Performed: AN AD [place]     Patient location during evaluation: Mother Baby Anesthesia Type: Epidural Level of consciousness: awake, awake and alert and oriented Pain management: pain level controlled Vital Signs Assessment: post-procedure vital signs reviewed and stable Respiratory status: spontaneous breathing and nonlabored ventilation Cardiovascular status: blood pressure returned to baseline and stable Postop Assessment: no headache, no backache, adequate PO intake, patient able to bend at knees and able to ambulate Anesthetic complications: no   No notable events documented.  Last Vitals:  Vitals:   11/10/22 2102 11/11/22 0410  BP: 94/62 (!) 84/49  Pulse: (!) 59   Resp: 18 16  Temp: 36.6 C 36.7 C  SpO2: 99% 98%    Last Pain:  Vitals:   11/11/22 0628  TempSrc:   PainSc: 0-No pain   Pain Goal: Patients Stated Pain Goal: 0 (11/10/22 0609)                 Sammie Bench

## 2022-11-11 NOTE — Plan of Care (Signed)
Problem: Education: Goal: Knowledge of Childbirth will improve Outcome: Adequate for Discharge Goal: Ability to make informed decisions regarding treatment and plan of care will improve Outcome: Adequate for Discharge Goal: Ability to state and carry out methods to decrease the pain will improve Outcome: Adequate for Discharge Goal: Individualized Educational Video(s) Outcome: Adequate for Discharge   Problem: Coping: Goal: Ability to verbalize concerns and feelings about labor and delivery will improve Outcome: Adequate for Discharge   Problem: Life Cycle: Goal: Ability to make normal progression through stages of labor will improve Outcome: Adequate for Discharge Goal: Ability to effectively push during vaginal delivery will improve Outcome: Adequate for Discharge   Problem: Role Relationship: Goal: Will demonstrate positive interactions with the child Outcome: Adequate for Discharge   Problem: Safety: Goal: Risk of complications during labor and delivery will decrease Outcome: Adequate for Discharge   Problem: Pain Management: Goal: Relief or control of pain from uterine contractions will improve Outcome: Adequate for Discharge   Problem: Education: Goal: Knowledge of General Education information will improve Description: Including pain rating scale, medication(s)/side effects and non-pharmacologic comfort measures Outcome: Adequate for Discharge   Problem: Health Behavior/Discharge Planning: Goal: Ability to manage health-related needs will improve Outcome: Adequate for Discharge   Problem: Clinical Measurements: Goal: Ability to maintain clinical measurements within normal limits will improve Outcome: Adequate for Discharge Goal: Will remain free from infection Outcome: Adequate for Discharge Goal: Diagnostic test results will improve Outcome: Adequate for Discharge Goal: Respiratory complications will improve Outcome: Adequate for Discharge Goal:  Cardiovascular complication will be avoided Outcome: Adequate for Discharge   Problem: Activity: Goal: Risk for activity intolerance will decrease Outcome: Adequate for Discharge   Problem: Nutrition: Goal: Adequate nutrition will be maintained Outcome: Adequate for Discharge   Problem: Coping: Goal: Level of anxiety will decrease Outcome: Adequate for Discharge   Problem: Elimination: Goal: Will not experience complications related to bowel motility Outcome: Adequate for Discharge Goal: Will not experience complications related to urinary retention Outcome: Adequate for Discharge   Problem: Pain Managment: Goal: General experience of comfort will improve Outcome: Adequate for Discharge   Problem: Safety: Goal: Ability to remain free from injury will improve Outcome: Adequate for Discharge   Problem: Skin Integrity: Goal: Risk for impaired skin integrity will decrease Outcome: Adequate for Discharge   Problem: Education: Goal: Knowledge of condition will improve Outcome: Adequate for Discharge Goal: Individualized Educational Video(s) Outcome: Adequate for Discharge Goal: Individualized Newborn Educational Video(s) Outcome: Adequate for Discharge   Problem: Activity: Goal: Will verbalize the importance of balancing activity with adequate rest periods Outcome: Adequate for Discharge Goal: Ability to tolerate increased activity will improve Outcome: Adequate for Discharge   Problem: Coping: Goal: Ability to identify and utilize available resources and services will improve Outcome: Adequate for Discharge   Problem: Life Cycle: Goal: Chance of risk for complications during the postpartum period will decrease Outcome: Adequate for Discharge   Problem: Role Relationship: Goal: Ability to demonstrate positive interaction with newborn will improve Outcome: Adequate for Discharge   Problem: Skin Integrity: Goal: Demonstration of wound healing without infection will  improve Outcome: Adequate for Discharge   Problem: Education: Goal: Knowledge of condition will improve Outcome: Adequate for Discharge Goal: Individualized Educational Video(s) Outcome: Adequate for Discharge Goal: Individualized Newborn Educational Video(s) Outcome: Adequate for Discharge   Problem: Activity: Goal: Will verbalize the importance of balancing activity with adequate rest periods Outcome: Adequate for Discharge Goal: Ability to tolerate increased activity will improve Outcome: Adequate for Discharge  Problem: Coping: Goal: Ability to identify and utilize available resources and services will improve Outcome: Adequate for Discharge   Problem: Life Cycle: Goal: Chance of risk for complications during the postpartum period will decrease Outcome: Adequate for Discharge   Problem: Role Relationship: Goal: Ability to demonstrate positive interaction with newborn will improve Outcome: Adequate for Discharge   Problem: Skin Integrity: Goal: Demonstration of wound healing without infection will improve Outcome: Adequate for Discharge

## 2022-11-12 ENCOUNTER — Ambulatory Visit: Payer: Medicaid Other

## 2022-11-12 ENCOUNTER — Ambulatory Visit: Payer: Medicaid Other | Admitting: Clinical

## 2022-11-13 ENCOUNTER — Encounter: Payer: Medicaid Other | Admitting: Women's Health

## 2022-11-19 NOTE — BH Specialist Note (Signed)
Pt did not arrive to video visit and did not answer the phone; Left HIPPA-compliant message to call back Caroly Purewal from [facility] at  [phone] (Jayleene Glaeser's office).  ?; left MyChart message for patient.  ? ?

## 2022-11-20 ENCOUNTER — Encounter: Payer: Medicaid Other | Admitting: Women's Health

## 2022-11-20 ENCOUNTER — Telehealth (HOSPITAL_COMMUNITY): Payer: Self-pay | Admitting: *Deleted

## 2022-11-20 NOTE — Telephone Encounter (Signed)
Left phone voicemail message.  Odis Hollingshead, RN 11-20-2022 at 10:30am

## 2022-11-22 ENCOUNTER — Ambulatory Visit: Payer: Medicaid Other

## 2022-11-26 ENCOUNTER — Ambulatory Visit (INDEPENDENT_AMBULATORY_CARE_PROVIDER_SITE_OTHER): Payer: Medicaid Other | Admitting: Women's Health

## 2022-11-26 ENCOUNTER — Encounter: Payer: Self-pay | Admitting: Women's Health

## 2022-11-26 VITALS — BP 101/64 | HR 102 | Ht 63.0 in | Wt 155.0 lb

## 2022-11-26 DIAGNOSIS — F418 Other specified anxiety disorders: Secondary | ICD-10-CM

## 2022-11-26 MED ORDER — SERTRALINE HCL 25 MG PO TABS: 25.0000 mg | ORAL_TABLET | Freq: Every day | ORAL | 6 refills | Status: DC

## 2022-11-26 NOTE — Progress Notes (Signed)
GYN VISIT Patient name: Vickie Sutton MRN [id number]  Date of birth: 2001-08-27 Chief Complaint:   Postpartum Care (Interested in IUD; had sex this am; don't feel like Lexapro is working)  History of Present Illness:   Vickie Sutton is a 21 y.o. G54P1102 Hispanic female 2wks s/p SVB being seen today for mood check.  EPDS today 13. Was on lexapro '20mg'$ , stopped 1wk ago, didn't feel it was helping.  Was hearing voices telling her to take more. Denies SI/HI/II. Has good support from boyfriend (not FOB) and family. Feels she is distancing herself from family/friends. No appetite, makes herself eat. Sleeping is hit or miss, sometimes up worried about things. Had visits w/ [name] during pregnancy. Bottlefeeding. Plans IUD, had sex this am.   Patient's last menstrual period was 02/19/2022 (exact date).     09/07/2022    9:05 AM 08/01/2022   11:01 AM 05/24/2022    9:30 AM 06/16/2021    9:44 AM 08/08/2020   10:33 AM  Depression screen PHQ 2/9  Decreased Interest 1 0 0 1 0  Down, Depressed, Hopeless 1 1 0 1 0  PHQ - 2 Score 2 1 0 2 0  Altered sleeping 1 1 0 0 0  Tired, decreased energy '1 3 2 2 2  '$ Change in appetite 0 0 0 0 0  Feeling bad or failure about yourself  0 0 0 0 0  Trouble concentrating 0 0 0 0 0  Moving slowly or fidgety/restless 0 1 0 0 0  Suicidal thoughts 0 0 0 0 0  PHQ-9 Score '4 6 2 4 2        '$ 09/07/2022    9:06 AM 08/01/2022   11:03 AM 05/24/2022    9:30 AM 06/16/2021    9:45 AM  GAD 7 : Generalized Anxiety Score  Nervous, Anxious, on Edge '1 2 1 1  '$ Control/stop worrying 1 0 0 0  Worry too much - different things '1 2 1 1  '$ Trouble relaxing 0 0 0 0  Restless 0 0 0 0  Easily annoyed or irritable '1 2 2 1  '$ Afraid - awful might happen 0 0 0 0  Total GAD 7 Score '4 6 4 3     '$ Review of Systems:   Pertinent items are noted in HPI Denies fever/chills, dizziness, headaches, visual disturbances, fatigue, shortness of breath, chest pain, abdominal pain, vomiting, abnormal vaginal  discharge/itching/odor/irritation, problems with periods, bowel movements, urination, or intercourse unless otherwise stated above.  Pertinent History Reviewed:  Reviewed past medical,surgical, social, obstetrical and family history.  Reviewed problem list, medications and allergies. Physical Assessment:   Vitals:   11/26/22 1335  BP: 101/64  Pulse: (!) 102  Weight: 155 lb (70.3 kg)  Height: '5\' 3"'$  (1.6 m)  Body mass index is 27.46 kg/m.       Physical Examination:   General appearance: alert, well appearing, and in no distress  Mental status: alert, oriented to person, place, and time  Skin: warm & dry   Cardiovascular: normal heart rate noted  Respiratory: normal respiratory effort, no distress  Abdomen: soft, non-tender   Pelvic: examination not indicated  Extremities: no edema   Chaperone: N/A    No results found for this or any previous visit (from the past 24 hour(s)).  Assessment & Plan:  1) 2wks s/p SVB> bottlefeeding  2) PPD/anxiety> rx zoloft '25mg'$ , note sent to [name] to reach out and schedule f/u appt. Has good family support. No SI/HI/II. Understands can  take few weeks to notice improvement on zoloft. If worsening before next appt, let us know.   3) Plans IUD> had sex this am, no sex until after IUD insertion  Meds:  Meds ordered this encounter  Medications   sertraline (ZOLOFT) 25 MG tablet    Sig: Take 1 tablet (25 mg total) by mouth daily.    Dispense:  30 tablet    Refill:  6    No orders of the defined types were placed in this encounter.   Return for As scheduled 12/29 for pp visit, and will get IUD.  [place], [facility] 11/26/2022 2:20 PM

## 2022-11-26 NOTE — Patient Instructions (Addendum)
NO SEX UNTIL AFTER YOU GET YOUR BIRTH CONTROL    If you're in crisis please use our 24-Hour Crisis Hotline for immediate access to a clinician. [place]: [phone]

## 2022-11-27 ENCOUNTER — Encounter: Payer: Medicaid Other | Admitting: Women's Health

## 2022-11-28 ENCOUNTER — Ambulatory Visit: Payer: Medicaid Other

## 2022-11-28 ENCOUNTER — Other Ambulatory Visit: Payer: Medicaid Other

## 2022-11-29 ENCOUNTER — Ambulatory Visit: Payer: Medicaid Other | Admitting: Clinical

## 2022-11-29 DIAGNOSIS — Z91199 Patient's noncompliance with other medical treatment and regimen due to unspecified reason: Secondary | ICD-10-CM

## 2022-12-21 ENCOUNTER — Encounter: Payer: Self-pay | Admitting: Family Medicine

## 2022-12-21 ENCOUNTER — Ambulatory Visit (INDEPENDENT_AMBULATORY_CARE_PROVIDER_SITE_OTHER): Payer: Medicaid Other | Admitting: Family Medicine

## 2022-12-21 VITALS — BP 95/52 | HR 59 | Ht 63.0 in | Wt 159.8 lb

## 2022-12-21 DIAGNOSIS — Z3202 Encounter for pregnancy test, result negative: Secondary | ICD-10-CM | POA: Diagnosis not present

## 2022-12-21 LAB — POCT URINE PREGNANCY: Preg Test, Ur: NEGATIVE

## 2022-12-21 MED ORDER — SERTRALINE HCL 50 MG PO TABS: 50.0000 mg | ORAL_TABLET | Freq: Every day | ORAL | 3 refills | Status: DC

## 2022-12-21 NOTE — Progress Notes (Signed)
POSTPARTUM VISIT Patient name: Vickie Sutton MRN [id number]  Date of birth: 08/14/2001 Chief Complaint:   Postpartum Care (Pt requests pregnancy test; thinks Zoloft needs to be increased)  History of Present Illness:   Vickie Sutton is a 21 y.o. G34P1102 Hispanic female being seen today for a postpartum visit. She is 5 week postpartum following a spontaneous vaginal delivery at 17 gestational weeks. IOL: no, for NA. Anesthesia: epidural.  Laceration: none.  Complications: none. Inpatient contraception: no.   Pregnancy uncomplicated. Tobacco use: no. Substance use disorder: no. Last pap smear: 6/22 and results were NILM w/ HRHPV negative. Next pap smear due: 06/14/24 No LMP recorded.  Postpartum course has been uncomplicated. Bleeding none. Bowel function is  constipation . Bladder function is normal. Urinary incontinence? yes stress  , fecal incontinence? no Patient is sexually active. Last sexual activity:  12/21/22 . Desired contraception:  Declines . Patient does want a pregnancy in the future.  Desired family size is 4 children.   Upstream - 12/21/22 1135       Pregnancy Intention Screening   Does the patient want to become pregnant in the next year? Yes    Does the patient's partner want to become pregnant in the next year? Yes    Would the patient like to discuss contraceptive options today? No      Contraception Wrap Up   Current Method No Method - Other Reason    Reason for No Current Contraceptive Method at Intake ([facility] Only) Seeking Pregnancy    End Method No Method - Other Reason            The pregnancy intention screening data noted above was reviewed. Potential methods of contraception were discussed. The patient elected to proceed with No Method - Other Reason.  Edinburgh Postpartum Depression Screening: positive: H/O mental health disorder: yes depression. Currently on meds: yes Zoloft.  Currently in therapy: no.  Sleeping: ok.  Appetite: ok.  Still finds joy in things  she used to: No.  Support at home: yes .  SI/HI/II: no.  Interested in medicine: Yes.  Interested in therapy: Yes.  Edinburgh Postnatal Depression Scale - 12/21/22 1123       Edinburgh Postnatal Depression Scale:  In the Past 7 Days   I have been able to laugh and see the funny side of things. 1    I have looked forward with enjoyment to things. 1    I have blamed myself unnecessarily when things went wrong. 2    I have been anxious or worried for no good reason. 2    I have felt scared or panicky for no good reason. 1    Things have been getting on top of me. 2    I have been so unhappy that I have had difficulty sleeping. 1    I have felt sad or miserable. 1    I have been so unhappy that I have been crying. 1    The thought of harming myself has occurred to me. 0    Edinburgh Postnatal Depression Scale Total 12                09/07/2022    9:06 AM 08/01/2022   11:03 AM 05/24/2022    9:30 AM 06/16/2021    9:45 AM  GAD 7 : Generalized Anxiety Score  Nervous, Anxious, on Edge _0 Control/stop worrying 1 0 0 0  Worry too much - different things 1 2  1 1  Trouble relaxing 0 0 0 0  Restless 0 0 0 0  Easily annoyed or irritable _0 Afraid - awful might happen 0 0 0 0  Total GAD 7 Score _1 Baby's course has been uncomplicated. Baby is feeding by bottle. Infant has a pediatrician/family doctor? Yes.  Childcare strategy if returning to work/school: daycare.  Pt has material needs met for her and baby: Yes.   Review of Systems:   Pertinent items are noted in HPI Denies Abnormal vaginal discharge w/ itching/odor/irritation, headaches, visual changes, shortness of breath, chest pain, abdominal pain, severe nausea/vomiting, or problems with urination or bowel movements. Pertinent History Reviewed:  Reviewed past medical,surgical, obstetrical and family history.  Reviewed problem list, medications and allergies. OB History  Gravida Para Term Preterm AB Living  _2 SAB IAB Ectopic Multiple Live Births        0 2    # Outcome Date GA Lbr Len/2nd Weight Sex Delivery Anes PTL Lv  2 Preterm 11/10/22 66w6d08:18 / 01:40 5 lb 7.8 oz (2.49 kg) M Vag-Spont EPI  LIV  1 Term 02/10/21 390w3d0:40 / 04:23 6 lb 10 oz (3.005 kg) M Vag-Spont EPI N LIV   Physical Assessment:   Vitals:   12/21/22 1119  BP: (!) 95/52  Pulse: (!) 59  Weight: 159 lb 12.8 oz (72.5 kg)  Height: _3  (1.6 m)  Body mass index is 28.31 kg/m.       Physical Examination:   General appearance: alert, well appearing, and in no distress  Mental status: alert, oriented to person, place, and time  Skin: warm & dry   Cardiovascular: normal heart rate noted   Respiratory: normal respiratory effort, no distress   Breasts: deferred, no complaints   Abdomen: soft, non-tender   Pelvic: exam declined by the patient. Thin prep pap obtained: No  Rectal: not examined  Extremities: Edema: none   Chaperone: N/A         Results for orders placed or performed in visit on 12/21/22 (from the past 24 hour(s))  POCT urine pregnancy   Collection Time: 12/21/22 11:31 AM  Result Value Ref Range   Preg Test, Ur Negative Negative    Assessment & Plan:  1) Postpartum exam 2) 5 wks s/p spontaneous vaginal delivery 3) bottle feeding 4) Depression screening 5) Contraception counseling  Essential components of care per ACOG recommendations:  1.  Mood and well being:  If positive depression screen, discussed and plan developed.  Patient currently on Zoloft 25 mg.  Has been on Zoloft for 3 weeks.  Increasing dose to 50 mg.  [facility] and patient reports she will schedule the appointment and would not like me to send a message.  Follow-up here in 3 weeks for depression checkup. If using tobacco we discussed reduction/cessation and risk of relapse If current substance abuse, we discussed and referral to local resources was offered.   2. Infant care and feeding:   If breastfeeding, discussed returning to work, pumping, breastfeeding-associated pain, guidance regarding return to fertility while lactating if not using another method. If needed, patient was provided with a letter to be allowed to pump q 2-3hrs to support lactation in a private location with access to a refrigerator to store breastmilk.   Recommended that all caregivers be immunized for flu, pertussis and other preventable communicable diseases  If pt does not have material needs met for her/baby, referred to local resources for help obtaining these.  3. Sexuality, contraception and birth spacing Provided guidance regarding sexuality, management of dyspareunia, and resumption of intercourse Discussed avoiding interpregnancy interval <15mhs and recommended birth spacing of 18 months.  Patient reports that she wants to get pregnant sooner than that.  Further counseling done.  4. Sleep and fatigue Discussed coping options for fatigue and sleep disruption Encouraged family/partner/community support of 4 hrs of uninterrupted sleep to help with mood and fatigue  5. Physical recovery  If pt had a C/S, assessed incisional pain and providing guidance on normal vs prolonged recovery If pt had a laceration, perineal healing and pain reviewed.  If urinary or fecal incontinence, discussed management and referred to PT or uro/gyn if indicated  Patient is safe to resume physical activity. Discussed attainment of healthy weight.  6.  Chronic disease management Discussed pregnancy complications if any, and their implications for future childbearing and long-term maternal health. Review recommendations for prevention of recurrent pregnancy complications, such as 17 hydroxyprogesterone caproate to reduce risk for recurrent PTB not applicable, or aspirin to reduce risk of preeclampsia not applicable. Pt had GDM: no. If yes, 2hr GTT scheduled: not applicable. Reviewed medications and non-pregnant dosing  including consideration of whether pt is breastfeeding using a reliable resource such as LactMed: not applicable Referred for f/u w/ PCP or subspecialist providers as indicated: not applicable  7. Health maintenance Mammogram at [id number] earlier if indicated Pap smears as indicated  Meds: No orders of the defined types were placed in this encounter.   Follow-up: No follow-ups on file.   Orders Placed This Encounter  Procedures   POCT urine pregnancy    JConcepcion LivingMD  12/21/2022 11:36 AM

## 2022-12-25 ENCOUNTER — Other Ambulatory Visit: Payer: Self-pay | Admitting: Physician Assistant

## 2023-01-11 ENCOUNTER — Encounter: Payer: Self-pay | Admitting: Advanced Practice Midwife

## 2023-01-11 ENCOUNTER — Ambulatory Visit (INDEPENDENT_AMBULATORY_CARE_PROVIDER_SITE_OTHER): Payer: Medicaid Other | Admitting: Advanced Practice Midwife

## 2023-01-11 VITALS — BP 90/59 | HR 70 | Ht 63.0 in | Wt 157.0 lb

## 2023-01-11 DIAGNOSIS — F53 Postpartum depression: Secondary | ICD-10-CM

## 2023-01-11 DIAGNOSIS — F419 Anxiety disorder, unspecified: Secondary | ICD-10-CM

## 2023-01-11 DIAGNOSIS — F418 Other specified anxiety disorders: Secondary | ICD-10-CM

## 2023-01-11 MED ORDER — SERTRALINE HCL 50 MG PO TABS: 50.0000 mg | ORAL_TABLET | Freq: Every day | ORAL | 12 refills | Status: AC

## 2023-01-11 NOTE — Progress Notes (Signed)
GYN VISIT Patient name: Vickie Sutton MRN [id number]  Date of birth: 2001-10-15 Chief Complaint:   Follow-up (depression)  History of Present Illness:   Vickie Sutton is a 22 y.o. G65P1102 Hispanic female being seen today for f/u of postpartum depression with Zoloft increase to '50mg'$  on Dec 29th.  States she is doing much better; didn't have a visit with Roselyn Reef due to being busy. Wants to continue on Zoloft '50mg'$ . Raises concern of change in the way foods taste and feeling nauseous at dinner. Patient's last menstrual period was 12/29/2022. The current method of family planning is none.  Last pap June 2022. Results were: NILM w/ HRHPV not done     01/11/2023    9:46 AM 09/07/2022    9:05 AM 08/01/2022   11:01 AM 05/24/2022    9:30 AM 06/16/2021    9:44 AM  Depression screen PHQ 2/9  Decreased Interest  1 0 0 1  Down, Depressed, Hopeless '1 1 1 '$ 0 1  PHQ - 2 Score '1 2 1 '$ 0 2  Altered sleeping 0 1 1 0 0  Tired, decreased energy '1 1 3 2 2  '$ Change in appetite 0 0 0 0 0  Feeling bad or failure about yourself  0 0 0 0 0  Trouble concentrating 0 0 0 0 0  Moving slowly or fidgety/restless 0 0 1 0 0  Suicidal thoughts 0 0 0 0 0  PHQ-9 Score '2 4 6 2 4  '$ Difficult doing work/chores Not difficult at all            01/11/2023    9:48 AM 09/07/2022    9:06 AM 08/01/2022   11:03 AM 05/24/2022    9:30 AM  GAD 7 : Generalized Anxiety Score  Nervous, Anxious, on Edge '1 1 2 1  '$ Control/stop worrying 0 1 0 0  Worry too much - different things 0 '1 2 1  '$ Trouble relaxing 0 0 0 0  Restless 0 0 0 0  Easily annoyed or irritable '1 1 2 2  '$ Afraid - awful might happen 0 0 0 0  Total GAD 7 Score '2 4 6 4     '$ Review of Systems:   Pertinent items are noted in HPI Denies fever/chills, dizziness, headaches, visual disturbances, fatigue, shortness of breath, chest pain, abdominal pain, vomiting, abnormal vaginal discharge/itching/odor/irritation, problems with periods, bowel movements, urination, or intercourse unless  otherwise stated above.  Pertinent History Reviewed:  Reviewed past medical,surgical, social, obstetrical and family history.  Reviewed problem list, medications and allergies. Physical Assessment:   Vitals:   01/11/23 0938  BP: (!) 90/59  Pulse: 70  Weight: 157 lb (71.2 kg)  Height: '5\' 3"'$  (1.6 m)  Body mass index is 27.81 kg/m.       Physical Examination:   General appearance: alert, well appearing, and in no distress  Mental status: alert, oriented to person, place, and time  Skin: warm & dry   Cardiovascular: normal heart rate noted  Respiratory: normal respiratory effort, no distress  Abdomen: soft, non-tender   Pelvic: examination not indicated  Extremities: no edema    No results found for this or any previous visit (from the past 24 hour(s)).  Assessment & Plan:  1) Follow up of postpartum depression> doing well on Zoloft '50mg'$  and wants to continue  2) Change in taste buds/nauseous> able to keep food down; feels fine earlier in the day but dinner is when symptoms start; no recent Covid symptoms; unsure where this  is coming from  Meds:  Meds ordered this encounter  Medications   sertraline (ZOLOFT) 50 MG tablet    Sig: Take 1 tablet (50 mg total) by mouth daily.    Dispense:  30 tablet    Refill:  12    Order Specific Question:   Supervising Provider    Answer:   Janyth Pupa [[id number]]    No orders of the defined types were placed in this encounter.   Return for Pap & Physical June 2025.  Myrtis Ser CNM 01/11/2023 10:05 AM

## 2023-01-11 NOTE — Patient Instructions (Signed)
Try www.postpartum.net for postpartum depression resources

## 2023-01-21 NOTE — BH Specialist Note (Signed)
Pt did not arrive to video visit and unable to reach by phone as phone is not in service; left MyChart message for patient.

## 2023-02-04 ENCOUNTER — Ambulatory Visit: Payer: Medicaid Other | Admitting: Clinical

## 2023-02-04 DIAGNOSIS — Z91199 Patient's noncompliance with other medical treatment and regimen due to unspecified reason: Secondary | ICD-10-CM

## 2023-05-14 ENCOUNTER — Encounter: Payer: Self-pay | Admitting: "Endocrinology

## 2023-05-27 ENCOUNTER — Ambulatory Visit (INDEPENDENT_AMBULATORY_CARE_PROVIDER_SITE_OTHER): Payer: Medicaid Other | Admitting: *Deleted

## 2023-05-27 VITALS — BP 101/68 | HR 76 | Ht 64.0 in | Wt 172.0 lb

## 2023-05-27 DIAGNOSIS — Z3201 Encounter for pregnancy test, result positive: Secondary | ICD-10-CM | POA: Diagnosis not present

## 2023-05-27 DIAGNOSIS — N926 Irregular menstruation, unspecified: Secondary | ICD-10-CM

## 2023-05-27 LAB — POCT URINE PREGNANCY: Preg Test, Ur: POSITIVE — AB

## 2023-05-27 NOTE — Progress Notes (Signed)
   NURSE VISIT- PREGNANCY CONFIRMATION   SUBJECTIVE:  Vickie Sutton is a 22 y.o. [phone] female at [redacted]w[redacted]d by certain LMP of Patient's last menstrual period was 04/20/2023. Here for pregnancy confirmation.  Home pregnancy test: positive x 4   She reports cramping.  She is taking prenatal vitamins.    OBJECTIVE:  BP 101/68 (BP Location: Left Arm, Patient Position: Sitting, Cuff Size: Normal)   Pulse 76   Ht 5\' 4"  (1.626 m)   Wt 172 lb (78 kg)   LMP 04/20/2023   Breastfeeding No   BMI 29.52 kg/m   Appears well, in no apparent distress  Results for orders placed or performed in visit on 05/27/23 (from the past 24 hour(s))  POCT urine pregnancy   Collection Time: 05/27/23  4:23 PM  Result Value Ref Range   Preg Test, Ur Positive (A) Negative    ASSESSMENT: Positive pregnancy test, [redacted]w[redacted]d by LMP    PLAN: Schedule for dating ultrasound in 3 weeks Prenatal vitamins: continue   Nausea medicines: not currently needed   OB packet given: Yes  Jobe Marker  05/27/2023 4:35 PM

## 2023-06-19 ENCOUNTER — Encounter: Payer: Self-pay | Admitting: Women's Health

## 2023-06-19 ENCOUNTER — Other Ambulatory Visit: Payer: Self-pay | Admitting: Obstetrics & Gynecology

## 2023-06-19 DIAGNOSIS — O3680X0 Pregnancy with inconclusive fetal viability, not applicable or unspecified: Secondary | ICD-10-CM

## 2023-06-20 ENCOUNTER — Ambulatory Visit (INDEPENDENT_AMBULATORY_CARE_PROVIDER_SITE_OTHER): Payer: Medicaid Other

## 2023-06-20 DIAGNOSIS — Z3A08 8 weeks gestation of pregnancy: Secondary | ICD-10-CM | POA: Diagnosis not present

## 2023-06-20 DIAGNOSIS — Z3481 Encounter for supervision of other normal pregnancy, first trimester: Secondary | ICD-10-CM

## 2023-06-20 DIAGNOSIS — O3680X0 Pregnancy with inconclusive fetal viability, not applicable or unspecified: Secondary | ICD-10-CM

## 2023-06-20 NOTE — Progress Notes (Signed)
[place] 8+5 wks,single IUP with yolk sac,normal ovaries,FHR 178 bpm,CRL 20.25 mm

## 2023-07-03 ENCOUNTER — Ambulatory Visit: Payer: Medicaid Other | Admitting: "Endocrinology

## 2023-07-18 DIAGNOSIS — Z3490 Encounter for supervision of normal pregnancy, unspecified, unspecified trimester: Secondary | ICD-10-CM | POA: Insufficient documentation

## 2023-07-23 ENCOUNTER — Other Ambulatory Visit: Payer: Self-pay | Admitting: Obstetrics & Gynecology

## 2023-07-23 DIAGNOSIS — Z3682 Encounter for antenatal screening for nuchal translucency: Secondary | ICD-10-CM

## 2023-07-24 ENCOUNTER — Ambulatory Visit (INDEPENDENT_AMBULATORY_CARE_PROVIDER_SITE_OTHER): Payer: Medicaid Other | Admitting: Advanced Practice Midwife

## 2023-07-24 ENCOUNTER — Encounter: Payer: Medicaid Other | Admitting: *Deleted

## 2023-07-24 ENCOUNTER — Encounter: Payer: Self-pay | Admitting: Advanced Practice Midwife

## 2023-07-24 ENCOUNTER — Ambulatory Visit (INDEPENDENT_AMBULATORY_CARE_PROVIDER_SITE_OTHER): Payer: Medicaid Other

## 2023-07-24 VITALS — BP 94/68 | HR 62 | Wt 165.0 lb

## 2023-07-24 DIAGNOSIS — Z3A13 13 weeks gestation of pregnancy: Secondary | ICD-10-CM

## 2023-07-24 DIAGNOSIS — Z363 Encounter for antenatal screening for malformations: Secondary | ICD-10-CM | POA: Diagnosis not present

## 2023-07-24 DIAGNOSIS — Z3480 Encounter for supervision of other normal pregnancy, unspecified trimester: Secondary | ICD-10-CM

## 2023-07-24 DIAGNOSIS — Z3481 Encounter for supervision of other normal pregnancy, first trimester: Secondary | ICD-10-CM

## 2023-07-24 DIAGNOSIS — Z3682 Encounter for antenatal screening for nuchal translucency: Secondary | ICD-10-CM

## 2023-07-24 NOTE — Progress Notes (Signed)
[place] 13+4 wks,measurements c/w dates,FHR 157 bpm,NB present,NT 2.7 mm,fundal placenta,normal ovaries

## 2023-07-24 NOTE — Patient Instructions (Signed)
Vickie Sutton, thank you for choosing our office today! We appreciate the opportunity to meet your healthcare needs. You may receive a short survey by mail, e-mail, or through MyChart. If you are happy with your care we would appreciate if you could take just a few minutes to complete the survey questions. We read all of your comments and take your feedback very seriously. Thank you again for choosing our office.  [facility] ([address] [place], [place] [address]) Entrance C, located off of [address] Free 24/7 valet parking   Nausea & Vomiting Have saltine crackers or pretzels by your bed and eat a few bites before you raise your head out of bed in the morning Eat small frequent meals throughout the day instead of large meals Drink plenty of fluids throughout the day to stay hydrated, just don't drink a lot of fluids with your meals.  This can make your stomach fill up faster making you feel sick Do not brush your teeth right after you eat Products with real ginger are good for nausea, like ginger ale and ginger hard candy Make sure it says made with real ginger! Sucking on sour candy like lemon heads is also good for nausea If your prenatal vitamins make you nauseated, take them at night so you will sleep through the nausea Sea Bands If you feel like you need medicine for the nausea & vomiting please let us know If you are unable to keep any fluids or food down please let us know   Constipation Drink plenty of fluid, preferably water, throughout the day Eat foods high in fiber such as fruits, vegetables, and grains Exercise, such as walking, is a good way to keep your bowels regular Drink warm fluids, especially warm prune juice, or decaf coffee Eat a 1/2 cup of real oatmeal (not instant), 1/2 cup applesauce, and 1/2-1 cup warm prune juice every day If needed, you may take Colace (docusate sodium) stool softener  once or twice a day to help keep the stool soft.  If you still are having problems with constipation, you may take Miralax once daily as needed to help keep your bowels regular.   Home Blood Pressure Monitoring for Patients   Your provider has recommended that you check your blood pressure (BP) at least once a week at home. If you do not have a blood pressure cuff at home, one will be provided for you. Contact your provider if you have not received your monitor within 1 week.   Helpful Tips for Accurate Home Blood Pressure Checks  Don't smoke, exercise, or drink caffeine 30 minutes before checking your BP Use the restroom before checking your BP (a full bladder can raise your pressure) Relax in a comfortable upright chair Feet on the ground Left arm resting comfortably on a flat surface at the level of your heart Legs uncrossed Back supported Sit quietly and don't talk Place the cuff on your bare arm Adjust snuggly, so that only two fingertips can fit between your skin and the top of the cuff Check 2 readings separated by at least one minute Keep a log of your BP readings For a visual, please reference this diagram: http://ccnc.care/bpdiagram  Provider Name: [facility]     Phone: [phone]  Zone 1: ALL CLEAR  Continue to monitor your symptoms:  BP reading is less than 140 (top number) or less than 90 (bottom   number)  No right upper stomach pain No headaches or seeing spots No feeling nauseated or throwing up No swelling in face and hands  Zone 2: CAUTION Call your doctor's office for any of the following:  BP reading is greater than 140 (top number) or greater than 90 (bottom number)  Stomach pain under your ribs in the middle or right side Headaches or seeing spots Feeling nauseated or throwing up Swelling in face and hands  Zone 3: EMERGENCY  Seek immediate medical care if you have any of the following:  BP reading is greater than160 (top number) or greater than  110 (bottom number) Severe headaches not improving with Tylenol Serious difficulty catching your breath Any worsening symptoms from Zone 2    First Trimester of Pregnancy The first trimester of pregnancy is from week 1 until the end of week 12 (months 1 through 3). A week after a sperm fertilizes an egg, the egg will implant on the wall of the uterus. This embryo will begin to develop into a baby. Genes from you and your partner are forming the baby. The female genes determine whether the baby is a boy or a girl. At 6-8 weeks, the eyes and face are formed, and the heartbeat can be seen on ultrasound. At the end of 12 weeks, all the baby's organs are formed.  Now that you are pregnant, you will want to do everything you can to have a healthy baby. Two of the most important things are to get good prenatal care and to follow your health care provider's instructions. Prenatal care is all the medical care you receive before the baby's birth. This care will help prevent, find, and treat any problems during the pregnancy and childbirth. BODY CHANGES Your body goes through many changes during pregnancy. The changes vary from woman to woman.  You may gain or lose a couple of pounds at first. You may feel sick to your stomach (nauseous) and throw up (vomit). If the vomiting is uncontrollable, call your health care provider. You may tire easily. You may develop headaches that can be relieved by medicines approved by your health care provider. You may urinate more often. Painful urination may mean you have a bladder infection. You may develop heartburn as a result of your pregnancy. You may develop constipation because certain hormones are causing the muscles that push waste through your intestines to slow down. You may develop hemorrhoids or swollen, bulging veins (varicose veins). Your breasts may begin to grow larger and become tender. Your nipples may stick out more, and the tissue that surrounds them  (areola) may become darker. Your gums may bleed and may be sensitive to brushing and flossing. Dark spots or blotches (chloasma, mask of pregnancy) may develop on your face. This will likely fade after the baby is born. Your menstrual periods will stop. You may have a loss of appetite. You may develop cravings for certain kinds of food. You may have changes in your emotions from day to day, such as being excited to be pregnant or being concerned that something may go wrong with the pregnancy and baby. You may have more vivid and strange dreams. You may have changes in your hair. These can include thickening of your hair, rapid growth, and changes in texture. Some women also have hair loss during or after pregnancy, or hair that feels dry or thin. Your hair will most likely return to normal after your baby is born. WHAT TO EXPECT AT YOUR PRENATAL   VISITS During a routine prenatal visit: You will be weighed to make sure you and the baby are growing normally. Your blood pressure will be taken. Your abdomen will be measured to track your baby's growth. The fetal heartbeat will be listened to starting around week 10 or 12 of your pregnancy. Test results from any previous visits will be discussed. Your health care provider may ask you: How you are feeling. If you are feeling the baby move. If you have had any abnormal symptoms, such as leaking fluid, bleeding, severe headaches, or abdominal cramping. If you have any questions. Other tests that may be performed during your first trimester include: Blood tests to find your blood type and to check for the presence of any previous infections. They will also be used to check for low iron levels (anemia) and Rh antibodies. Later in the pregnancy, blood tests for diabetes will be done along with other tests if problems develop. Urine tests to check for infections, diabetes, or protein in the urine. An ultrasound to confirm the proper growth and development  of the baby. An amniocentesis to check for possible genetic problems. Fetal screens for spina bifida and Down syndrome. You may need other tests to make sure you and the baby are doing well. HOME CARE INSTRUCTIONS  Medicines Follow your health care provider's instructions regarding medicine use. Specific medicines may be either safe or unsafe to take during pregnancy. Take your prenatal vitamins as directed. If you develop constipation, try taking a stool softener if your health care provider approves. Diet Eat regular, well-balanced meals. Choose a variety of foods, such as meat or vegetable-based protein, fish, milk and low-fat dairy products, vegetables, fruits, and whole grain breads and cereals. Your health care provider will help you determine the amount of weight gain that is right for you. Avoid raw meat and uncooked cheese. These carry germs that can cause birth defects in the baby. Eating four or five small meals rather than three large meals a day may help relieve nausea and vomiting. If you start to feel nauseous, eating a few soda crackers can be helpful. Drinking liquids between meals instead of during meals also seems to help nausea and vomiting. If you develop constipation, eat more high-fiber foods, such as fresh vegetables or fruit and whole grains. Drink enough fluids to keep your urine clear or pale yellow. Activity and Exercise Exercise only as directed by your health care provider. Exercising will help you: Control your weight. Stay in shape. Be prepared for labor and delivery. Experiencing pain or cramping in the lower abdomen or low back is a good sign that you should stop exercising. Check with your health care provider before continuing normal exercises. Try to avoid standing for long periods of time. Move your legs often if you must stand in one place for a long time. Avoid heavy lifting. Wear low-heeled shoes, and practice good posture. You may continue to have sex  unless your health care provider directs you otherwise. Relief of Pain or Discomfort Wear a good support bra for breast tenderness.   Take warm sitz baths to soothe any pain or discomfort caused by hemorrhoids. Use hemorrhoid cream if your health care provider approves.   Rest with your legs elevated if you have leg cramps or low back pain. If you develop varicose veins in your legs, wear support hose. Elevate your feet for 15 minutes, 3-4 times a day. Limit salt in your diet. Prenatal Care Schedule your prenatal visits by the   twelfth week of pregnancy. They are usually scheduled monthly at first, then more often in the last 2 months before delivery. Write down your questions. Take them to your prenatal visits. Keep all your prenatal visits as directed by your health care provider. Safety Wear your seat belt at all times when driving. Make a list of emergency phone numbers, including numbers for family, friends, the hospital, and police and fire departments. General Tips Ask your health care provider for a referral to a local prenatal education class. Begin classes no later than at the beginning of month 6 of your pregnancy. Ask for help if you have counseling or nutritional needs during pregnancy. Your health care provider can offer advice or refer you to specialists for help with various needs. Do not use hot tubs, steam rooms, or saunas. Do not douche or use tampons or scented sanitary pads. Do not cross your legs for long periods of time. Avoid cat litter boxes and soil used by cats. These carry germs that can cause birth defects in the baby and possibly loss of the fetus by miscarriage or stillbirth. Avoid all smoking, herbs, alcohol, and medicines not prescribed by your health care provider. Chemicals in these affect the formation and growth of the baby. Schedule a dentist appointment. At home, brush your teeth with a soft toothbrush and be gentle when you floss. SEEK MEDICAL CARE IF:   You have dizziness. You have mild pelvic cramps, pelvic pressure, or nagging pain in the abdominal area. You have persistent nausea, vomiting, or diarrhea. You have a bad smelling vaginal discharge. You have pain with urination. You notice increased swelling in your face, hands, legs, or ankles. SEEK IMMEDIATE MEDICAL CARE IF:  You have a fever. You are leaking fluid from your vagina. You have spotting or bleeding from your vagina. You have severe abdominal cramping or pain. You have rapid weight gain or loss. You vomit blood or material that looks like coffee grounds. You are exposed to German measles and have never had them. You are exposed to fifth disease or chickenpox. You develop a severe headache. You have shortness of breath. You have any kind of trauma, such as from a fall or a car accident. Document Released: 12/04/2001 Document Revised: 04/26/2014 Document Reviewed: 10/20/2013 ExitCare Patient Information 2015 [employer]. This information is not intended to replace advice given to you by your health care provider. Make sure you discuss any questions you have with your health care provider.  

## 2023-07-24 NOTE — Progress Notes (Signed)
INITIAL OBSTETRICAL VISIT Patient name: Vickie Sutton MRN [id number]  Date of birth: 17-May-2001 Chief Complaint:   Initial Prenatal Visit  History of Present Illness:   Vickie Sutton is a 22 y.o. [id number]  female at [redacted]w[redacted]d by LMP c/w u/s at 8.4 weeks with an Estimated Date of Delivery: 01/25/24 being seen today for her initial obstetrical visit.   Patient's last menstrual period was 04/20/2023. Her obstetrical history is significant for  SVD 2022 and 36.6wk delivery in 2023 with weight 5+7.8 .   Today she reports no complaints.  Last pap June 2022. Results were: NILM w/ HRHPV not done     07/24/2023   11:08 AM 01/11/2023    9:46 AM 09/07/2022    9:05 AM 08/01/2022   11:01 AM 05/24/2022    9:30 AM  Depression screen PHQ 2/9  Decreased Interest 0  1 0 0  Down, Depressed, Hopeless 0 1 1 1  0  PHQ - 2 Score 0 1 2 1  0  Altered sleeping 0 0 1 1 0  Tired, decreased energy 1 1 1 3 2   Change in appetite 0 0 0 0 0  Feeling bad or failure about yourself  0 0 0 0 0  Trouble concentrating 0 0 0 0 0  Moving slowly or fidgety/restless 0 0 0 1 0  Suicidal thoughts 0 0 0 0 0  PHQ-9 Score 1 2 4 6 2   Difficult doing work/chores  Not difficult at all           07/24/2023   11:08 AM 01/11/2023    9:48 AM 09/07/2022    9:06 AM 08/01/2022   11:03 AM  GAD 7 : Generalized Anxiety Score  Nervous, Anxious, on Edge 0 1 1 2   Control/stop worrying 0 0 1 0  Worry too much - different things 0 0 1 2  Trouble relaxing 0 0 0 0  Restless 0 0 0 0  Easily annoyed or irritable 1 1 1 2   Afraid - awful might happen 0 0 0 0  Total GAD 7 Score 1 2 4 6      Review of Systems:   Pertinent items are noted in HPI Denies cramping/contractions, leakage of fluid, vaginal bleeding, abnormal vaginal discharge w/ itching/odor/irritation, headaches, visual changes, shortness of breath, chest pain, abdominal pain, severe nausea/vomiting, or problems with urination or bowel movements unless otherwise stated above.  Pertinent  History Reviewed:  Reviewed past medical,surgical, social, obstetrical and family history.  Reviewed problem list, medications and allergies. OB History  Gravida Para Term Preterm AB Living  3 2 1 1   2   SAB IAB Ectopic Multiple Live Births        0 2    # Outcome Date GA Lbr Len/2nd Weight Sex Type Anes PTL Lv  3 Current           2 Preterm 11/10/22 [redacted]w[redacted]d 08:18 / 01:40 5 lb 7.8 oz (2.49 kg) M Vag-Spont EPI  LIV  1 Term 02/10/21 [redacted]w[redacted]d 00:40 / 04:23 6 lb 10 oz (3.005 kg) M Vag-Spont EPI N LIV   Physical Assessment:   Vitals:   07/24/23 1112  BP: 94/68  Pulse: 62  Weight: 165 lb (74.8 kg)  Body mass index is 28.32 kg/m.       Physical Examination:  General appearance - well appearing, and in no distress  Mental status - alert, oriented to person, place, and time  Psych:  She has a normal mood and affect  Skin - warm and dry, normal color, no suspicious lesions noted  Chest - effort normal, all lung fields clear to auscultation bilaterally  Heart - normal rate and regular rhythm  Abdomen - soft, nontender  Extremities:  No swelling or varicosities noted  Pelvic - not indicated  Thin prep pap is not done     TODAY'S NT [place] 13+4 wks,measurements c/w dates,FHR 157 bpm,NB present,NT 2.7 mm,fundal placenta,normal ovaries   No results found for this or any previous visit (from the past 24 hour(s)).  Assessment & Plan:  1) Low-Risk Pregnancy G3P1102 at [redacted]w[redacted]d with an Estimated Date of Delivery: 01/25/24   2) Initial OB visit  3) 36.6wk delivery  4) POTS, stable without meds  5) Anx/dep, taking Zoloft approx every other day  6) Hx Roux-en-Y, doesn't tolerate GTT, has meter and will check qid CBGs from 28-30wks  7) Hx FGR, will get EFW at 36wks  Meds: No orders of the defined types were placed in this encounter.   Initial labs obtained Continue prenatal vitamins Reviewed n/v relief measures and warning s/s to report Reviewed recommended weight gain based on pre-gravid  BMI Encouraged well-balanced diet Genetic & carrier screening discussed: requests Panorama and NT/IT, declines Horizon  Ultrasound discussed; fetal survey: requested CCNC completed> form faxed if has or is planning to apply for medicaid The nature of [facility] with multiple MDs and other Advanced Practice Providers was explained to patient; also emphasized that fellows, residents, and students are part of our team. Does have home bp cuff. Office bp cuff given: no. Rx sent: no. Check bp weekly, let us know if consistently >140/90.   No indications for ASA therapy or early HgbA1c (per uptodate)   Follow-up: Return for 4wk LROB/2nd IT; 7wk LROB/anatomy u/s.   Orders Placed This Encounter  Procedures   Urine Culture   GC/Chlamydia Probe Amp   US OB Comp + 14 Wk   Integrated 1   CBC/D/Plt+RPR+Rh+ABO+RubIgG...   PANORAMA PRENATAL TEST    Arabella Merles CNM 07/24/2023 11:30 AM

## 2023-08-19 ENCOUNTER — Encounter: Payer: Self-pay | Admitting: Women's Health

## 2023-08-21 ENCOUNTER — Ambulatory Visit (INDEPENDENT_AMBULATORY_CARE_PROVIDER_SITE_OTHER): Payer: Medicaid Other | Admitting: Advanced Practice Midwife

## 2023-08-21 ENCOUNTER — Encounter: Payer: Self-pay | Admitting: Advanced Practice Midwife

## 2023-08-21 VITALS — BP 102/65 | HR 104 | Wt 165.4 lb

## 2023-08-21 DIAGNOSIS — Z1379 Encounter for other screening for genetic and chromosomal anomalies: Secondary | ICD-10-CM

## 2023-08-21 DIAGNOSIS — Z3A17 17 weeks gestation of pregnancy: Secondary | ICD-10-CM

## 2023-08-21 DIAGNOSIS — Z3482 Encounter for supervision of other normal pregnancy, second trimester: Secondary | ICD-10-CM

## 2023-08-21 DIAGNOSIS — Z3480 Encounter for supervision of other normal pregnancy, unspecified trimester: Secondary | ICD-10-CM

## 2023-08-21 NOTE — Progress Notes (Signed)
   LOW-RISK PREGNANCY VISIT Patient name: Vickie Sutton MRN [id number]  Date of birth: 2001-01-24 Chief Complaint:   Routine Prenatal Visit (2nd IT)  History of Present Illness:   Vickie Sutton is a 21 y.o. [phone] female at [redacted]w[redacted]d with an Estimated Date of Delivery: 01/25/24 being seen today for ongoing management of a low-risk pregnancy.  Today she reports  some LBP, esp at night; has belly band . Contractions: Not present.  .  Movement: Absent. denies leaking of fluid. Review of Systems:   Pertinent items are noted in HPI Denies abnormal vaginal discharge w/ itching/odor/irritation, headaches, visual changes, shortness of breath, chest pain, abdominal pain, severe nausea/vomiting, or problems with urination or bowel movements unless otherwise stated above. Pertinent History Reviewed:  Reviewed past medical,surgical, social, obstetrical and family history.  Reviewed problem list, medications and allergies. Physical Assessment:   Vitals:   08/21/23 1508  BP: 102/65  Pulse: (!) 104  Weight: 165 lb 6.4 oz (75 kg)  Body mass index is 28.39 kg/m.        Physical Examination:   General appearance: Well appearing, and in no distress  Mental status: Alert, oriented to person, place, and time  Skin: Warm & dry  Cardiovascular: Normal heart rate noted  Respiratory: Normal respiratory effort, no distress  Abdomen: Soft, gravid, nontender  Pelvic: Cervical exam deferred         Extremities: Edema: None  Fetal Status: Fetal Heart Rate (bpm): 145   Movement: Absent    No results found for this or any previous visit (from the past 24 hour(s)).  Assessment & Plan:  1) Low-risk pregnancy G3P1102 at [redacted]w[redacted]d with an Estimated Date of Delivery: 01/25/24   2) LBP, has belly band; other tips given   Meds: No orders of the defined types were placed in this encounter.  Labs/procedures today: 2nd IT  Plan:  Continue routine obstetrical care   Reviewed: Preterm labor symptoms and general obstetric  precautions including but not limited to vaginal bleeding, contractions, leaking of fluid and fetal movement were reviewed in detail with the patient.  All questions were answered. Has home bp cuff. Check bp weekly, let us know if >140/90.   Follow-up: Return for As scheduled. (Anatomy u/s)  Orders Placed This Encounter  Procedures   INTEGRATED 2   Arabella Merles [facility] 08/21/2023 3:29 PM

## 2023-08-21 NOTE — Patient Instructions (Signed)
Vickie Sutton, thank you for choosing our office today! We appreciate the opportunity to meet your healthcare needs. You may receive a short survey by mail, e-mail, or through [employer]. If you are happy with your care we would appreciate if you could take just a few minutes to complete the survey questions. We read all of your comments and take your feedback very seriously. Thank you again for choosing our office.  Center for [employer] Team at [place] at [facility] ([place], [place] [address]) Entrance C, located off of [address] parking  Go to [email or web address] to register for FREE online childbirth classes  Call the office [phone]) or go to [facility] if: You begin to severe cramping Your water breaks.  Sometimes it is a big gush of fluid, sometimes it is just a trickle that keeps getting your panties wet or running down your legs You have vaginal bleeding.  It is normal to have a small amount of spotting if your cervix was checked.   [facility] Pediatricians/Family Doctors [facility]): [address] [place], [facility]: [address] [place], [phone]                [facility]): [employer], [phone] (call to ask if accepting patients) [facility] Department: [address], [place], [place] Pediatricians/Family Doctors [facility]): [place]. [place], [address], [facility]: [address] [place], [facility]: [place], [facility]): [phone] [facility]: [address] [place]: [address] [facility] [facility]: [phone], [phone]  Home Blood Pressure Monitoring for Patients   Your provider has recommended that you check your blood pressure (BP) at least once a week at home. If you do not have a blood pressure cuff at home, one will be provided for you. Contact your provider if you have not received your monitor within 1 week.   Helpful Tips for Accurate Home Blood Pressure Checks  Don't smoke, exercise, or drink caffeine 30 minutes before checking your BP Use the restroom before checking your BP (a full bladder can raise your pressure) Relax in a comfortable upright chair Feet on the ground Left arm resting comfortably on a flat surface at the level of your heart Legs uncrossed Back supported Sit quietly and don't talk Place the cuff on your bare arm Adjust snuggly, so that only two fingertips can fit between your skin and the top of the cuff Check 2 readings separated by at least one minute Keep a log of your BP readings For a visual, please reference this diagram: http://ccnc.care/bpdiagram  Provider Name: [facility]     Phone: [phone]  Zone 1: ALL CLEAR  Continue to monitor your symptoms:  BP reading is less than 140 (top number) or less than 90 (bottom number)  No right upper stomach pain No headaches or seeing spots No feeling nauseated or throwing up No swelling in face and hands  Zone 2: CAUTION Call your doctor's office for any of the following:  BP reading is greater than 140 (top number) or greater than  90 (bottom number)  Stomach pain under your ribs in the middle or right side Headaches or seeing spots Feeling nauseated or throwing up Swelling in face and hands  Zone 3: EMERGENCY  Seek immediate medical care if you have any of the following:  BP reading is greater than160 (top number) or greater than 110 (bottom number) Severe headaches not improving with Tylenol Serious difficulty catching your breath Any worsening symptoms from  Zone 2     Second Trimester of Pregnancy The second trimester is from week 14 through week 27 (months 4 through 6). The second trimester is often a time when you feel your best. Your body has adjusted to being pregnant, and you begin to feel better physically. Usually, morning sickness has lessened or quit completely, you may have more energy, and you may have an increase in appetite. The second trimester is also a time when the fetus is growing rapidly. At the end of the sixth month, the fetus is about 9 inches long and weighs about 1 pounds. You will likely begin to feel the baby move (quickening) between 16 and 20 weeks of pregnancy. Body changes during your second trimester Your body continues to go through many changes during your second trimester. The changes vary from woman to woman. Your weight will continue to increase. You will notice your lower abdomen bulging out. You may begin to get stretch marks on your hips, abdomen, and breasts. You may develop headaches that can be relieved by medicines. The medicines should be approved by your health care provider. You may urinate more often because the fetus is pressing on your bladder. You may develop or continue to have heartburn as a result of your pregnancy. You may develop constipation because certain hormones are causing the muscles that push waste through your intestines to slow down. You may develop hemorrhoids or swollen, bulging veins (varicose veins). You may have back pain. This is caused by: Weight gain. Pregnancy hormones that are relaxing the joints in your pelvis. A shift in weight and the muscles that support your balance. Your breasts will continue to grow and they will continue to become tender. Your gums may bleed and may be sensitive to brushing and flossing. Dark spots or blotches (chloasma, mask of pregnancy) may develop on your face. This will likely fade after the baby is born. A dark line from your belly button to  the pubic area (linea nigra) may appear. This will likely fade after the baby is born. You may have changes in your hair. These can include thickening of your hair, rapid growth, and changes in texture. Some women also have hair loss during or after pregnancy, or hair that feels dry or thin. Your hair will most likely return to normal after your baby is born.  What to expect at prenatal visits During a routine prenatal visit: You will be weighed to make sure you and the fetus are growing normally. Your blood pressure will be taken. Your abdomen will be measured to track your baby's growth. The fetal heartbeat will be listened to. Any test results from the previous visit will be discussed.  Your health care provider may ask you: How you are feeling. If you are feeling the baby move. If you have had any abnormal symptoms, such as leaking fluid, bleeding, severe headaches, or abdominal cramping. If you are using any tobacco products, including cigarettes, chewing tobacco, and electronic cigarettes. If you have any questions.  Other tests that may be performed during  your second trimester include: Blood tests that check for: Low iron levels (anemia). High blood sugar that affects pregnant women (gestational diabetes) between 41 and 28 weeks. Rh antibodies. This is to check for a protein on red blood cells (Rh factor). Urine tests to check for infections, diabetes, or protein in the urine. An ultrasound to confirm the proper growth and development of the baby. An amniocentesis to check for possible genetic problems. Fetal screens for spina bifida and Down syndrome. HIV (human immunodeficiency virus) testing. Routine prenatal testing includes screening for HIV, unless you choose not to have this test.  Follow these instructions at home: Medicines Follow your health care provider's instructions regarding medicine use. Specific medicines may be either safe or unsafe to take during  pregnancy. Take a prenatal vitamin that contains at least 600 micrograms (mcg) of folic acid. If you develop constipation, try taking a stool softener if your health care provider approves. Eating and drinking Eat a balanced diet that includes fresh fruits and vegetables, whole grains, good sources of protein such as meat, eggs, or tofu, and low-fat dairy. Your health care provider will help you determine the amount of weight gain that is right for you. Avoid raw meat and uncooked cheese. These carry germs that can cause birth defects in the baby. If you have low calcium intake from food, talk to your health care provider about whether you should take a daily calcium supplement. Limit foods that are high in fat and processed sugars, such as fried and sweet foods. To prevent constipation: Drink enough fluid to keep your urine clear or pale yellow. Eat foods that are high in fiber, such as fresh fruits and vegetables, whole grains, and beans. Activity Exercise only as directed by your health care provider. Most women can continue their usual exercise routine during pregnancy. Try to exercise for 30 minutes at least 5 days a week. Stop exercising if you experience uterine contractions. Avoid heavy lifting, wear low heel shoes, and practice good posture. A sexual relationship may be continued unless your health care provider directs you otherwise. Relieving pain and discomfort Wear a good support bra to prevent discomfort from breast tenderness. Take warm sitz baths to soothe any pain or discomfort caused by hemorrhoids. Use hemorrhoid cream if your health care provider approves. Rest with your legs elevated if you have leg cramps or low back pain. If you develop varicose veins, wear support hose. Elevate your feet for 15 minutes, 3-4 times a day. Limit salt in your diet. Prenatal Care Write down your questions. Take them to your prenatal visits. Keep all your prenatal visits as told by your health  care provider. This is important. Safety Wear your seat belt at all times when driving. Make a list of emergency phone numbers, including numbers for family, friends, the hospital, and police and fire departments. General instructions Ask your health care provider for a referral to a local prenatal education class. Begin classes no later than the beginning of month 6 of your pregnancy. Ask for help if you have counseling or nutritional needs during pregnancy. Your health care provider can offer advice or refer you to specialists for help with various needs. Do not use hot tubs, steam rooms, or saunas. Do not douche or use tampons or scented sanitary pads. Do not cross your legs for long periods of time. Avoid cat litter boxes and soil used by cats. These carry germs that can cause birth defects in the baby and possibly loss of the  fetus by miscarriage or stillbirth. Avoid all smoking, herbs, alcohol, and unprescribed drugs. Chemicals in these products can affect the formation and growth of the baby. Do not use any products that contain nicotine or tobacco, such as cigarettes and e-cigarettes. If you need help quitting, ask your health care provider. Visit your dentist if you have not gone yet during your pregnancy. Use a soft toothbrush to brush your teeth and be gentle when you floss. Contact a health care provider if: You have dizziness. You have mild pelvic cramps, pelvic pressure, or nagging pain in the abdominal area. You have persistent nausea, vomiting, or diarrhea. You have a bad smelling vaginal discharge. You have pain when you urinate. Get help right away if: You have a fever. You are leaking fluid from your vagina. You have spotting or bleeding from your vagina. You have severe abdominal cramping or pain. You have rapid weight gain or weight loss. You have shortness of breath with chest pain. You notice sudden or extreme swelling of your face, hands, ankles, feet, or legs. You  have not felt your baby move in over an hour. You have severe headaches that do not go away when you take medicine. You have vision changes. Summary The second trimester is from week 14 through week 27 (months 4 through 6). It is also a time when the fetus is growing rapidly. Your body goes through many changes during pregnancy. The changes vary from woman to woman. Avoid all smoking, herbs, alcohol, and unprescribed drugs. These chemicals affect the formation and growth your baby. Do not use any tobacco products, such as cigarettes, chewing tobacco, and e-cigarettes. If you need help quitting, ask your health care provider. Contact your health care provider if you have any questions. Keep all prenatal visits as told by your health care provider. This is important. This information is not intended to replace advice given to you by your health care provider. Make sure you discuss any questions you have with your health care provider. Document Released: 12/04/2001 Document Revised: 05/17/2016 Document Reviewed: 02/10/2013 Elsevier Interactive Patient Education  2017 [employer].

## 2023-08-23 LAB — INTEGRATED 2
AFP MoM: 1.21
Alpha-Fetoprotein: 33.6 ng/mL
Crown Rump Length: 73.5 mm
DIA MoM: 0.96
DIA Value: 145.6 pg/mL
Estriol, Unconjugated: 1.39 ng/mL
Gest. Age on Collection Date: 13.3 wk
Gestational Age: 17.3 wk
Maternal Age at EDD: 24.5 a
Nuchal Translucency (NT): 2.7 mm
Nuchal Translucency MoM: 1.6
Number of Fetuses: 1
PAPP-A MoM: 1.81
PAPP-A Value: 2181.9 ng/mL
Test Results:: NEGATIVE
Weight: 165 [lb_av]
Weight: 165 [lb_av]
hCG MoM: 1.36
hCG Value: 36.1 [IU]/mL
uE3 MoM: 1.28

## 2023-09-09 ENCOUNTER — Telehealth: Payer: Self-pay | Admitting: *Deleted

## 2023-09-09 ENCOUNTER — Other Ambulatory Visit: Payer: Self-pay | Admitting: Women's Health

## 2023-09-09 ENCOUNTER — Encounter: Payer: Self-pay | Admitting: Women's Health

## 2023-09-09 MED ORDER — AMOXICILLIN-POT CLAVULANATE 875-125 MG PO TABS: 1.0000 | ORAL_TABLET | Freq: Two times a day (BID) | ORAL | 0 refills | Status: DC

## 2023-09-09 NOTE — Telephone Encounter (Signed)
Patient states she has had chest and nasal congestion for over a week now along with green mucous.  She has taken some over the counter medication but nothing seems to be helping.  She is requesting an antibiotic if possible.

## 2023-09-13 ENCOUNTER — Encounter: Payer: Self-pay | Admitting: Obstetrics & Gynecology

## 2023-09-13 ENCOUNTER — Ambulatory Visit (INDEPENDENT_AMBULATORY_CARE_PROVIDER_SITE_OTHER): Payer: Medicaid Other

## 2023-09-13 ENCOUNTER — Ambulatory Visit (INDEPENDENT_AMBULATORY_CARE_PROVIDER_SITE_OTHER): Payer: Medicaid Other | Admitting: Obstetrics & Gynecology

## 2023-09-13 VITALS — BP 89/58 | HR 70 | Wt 169.0 lb

## 2023-09-13 DIAGNOSIS — Z3A20 20 weeks gestation of pregnancy: Secondary | ICD-10-CM

## 2023-09-13 DIAGNOSIS — Z3480 Encounter for supervision of other normal pregnancy, unspecified trimester: Secondary | ICD-10-CM

## 2023-09-13 DIAGNOSIS — Z363 Encounter for antenatal screening for malformations: Secondary | ICD-10-CM

## 2023-09-13 NOTE — Progress Notes (Signed)
LOW-RISK PREGNANCY VISIT Patient name: Vickie Sutton MRN [id number]  Date of birth: Feb 24, 2001 Chief Complaint:   Routine Prenatal Visit (Ultrasound today)  History of Present Illness:   Vickie Sutton is a 22 y.o. [phone] female at [redacted]w[redacted]d with an Estimated Date of Delivery: 01/25/24 being seen today for ongoing management of a low-risk pregnancy.     07/24/2023   11:08 AM 01/11/2023    9:46 AM 09/07/2022    9:05 AM 08/01/2022   11:01 AM 05/24/2022    9:30 AM  Depression screen PHQ 2/9  Decreased Interest 0  1 0 0  Down, Depressed, Hopeless 0 1 1 1  0  PHQ - 2 Score 0 1 2 1  0  Altered sleeping 0 0 1 1 0  Tired, decreased energy 1 1 1 3 2   Change in appetite 0 0 0 0 0  Feeling bad or failure about yourself  0 0 0 0 0  Trouble concentrating 0 0 0 0 0  Moving slowly or fidgety/restless 0 0 0 1 0  Suicidal thoughts 0 0 0 0 0  PHQ-9 Score 1 2 4 6 2   Difficult doing work/chores  Not difficult at all       Today she reports back ache. Contractions: Not present. Vag. Bleeding: None.  Movement: Present. denies leaking of fluid. Review of Systems:   Pertinent items are noted in HPI Denies abnormal vaginal discharge w/ itching/odor/irritation, headaches, visual changes, shortness of breath, chest pain, abdominal pain, severe nausea/vomiting, or problems with urination or bowel movements unless otherwise stated above. Pertinent History Reviewed:  Reviewed past medical,surgical, social, obstetrical and family history.  Reviewed problem list, medications and allergies. Physical Assessment:   Vitals:   09/13/23 1144  BP: (!) 89/58  Pulse: 70  Weight: 169 lb (76.7 kg)  Body mass index is 29.01 kg/m.        Physical Examination:   General appearance: Well appearing, and in no distress  Mental status: Alert, oriented to person, place, and time  Skin: Warm & dry  Cardiovascular: Normal heart rate noted  Respiratory: Normal respiratory effort, no distress  Abdomen: Soft, gravid,  nontender  Pelvic: Cervical exam deferred         Extremities: Edema: None  Fetal Status:     Movement: Present    Chaperone: n/a    No results found for this or any previous visit (from the past 24 hour(s)).  Assessment & Plan:  1) Low-risk pregnancy G3P1102 at [redacted]w[redacted]d with an Estimated Date of Delivery: 01/25/24   2) sonogram normal today,    Meds: No orders of the defined types were placed in this encounter.  Labs/procedures today: sonogram  Plan:  Continue routine obstetrical care  Next visit: prefers in person      Follow-up: Return in about 4 weeks (around 10/11/2023).  No orders of the defined types were placed in this encounter.   Lazaro Arms, MD 09/13/2023 11:52 AM

## 2023-09-13 NOTE — Progress Notes (Signed)
[place] 20+6 wks,cephalic,cx 2.8 cm,posterior placenta gr 0,normal ovaries,SVP of fluid 5 cm,FHR 157 bpm,EFW 362 g 30%,anatomy complete,no obvious abnormalities

## 2023-09-14 ENCOUNTER — Other Ambulatory Visit: Payer: Self-pay | Admitting: Physician Assistant

## 2023-09-17 ENCOUNTER — Encounter: Payer: Self-pay | Admitting: Women's Health

## 2023-09-18 ENCOUNTER — Other Ambulatory Visit: Payer: Self-pay | Admitting: Women's Health

## 2023-09-18 MED ORDER — ALBUTEROL SULFATE HFA 108 (90 BASE) MCG/ACT IN AERS: 1.0000 | INHALATION_SPRAY | RESPIRATORY_TRACT | 4 refills | Status: AC | PRN

## 2023-10-11 ENCOUNTER — Ambulatory Visit (INDEPENDENT_AMBULATORY_CARE_PROVIDER_SITE_OTHER): Payer: Medicaid Other | Admitting: Obstetrics and Gynecology

## 2023-10-11 ENCOUNTER — Other Ambulatory Visit (HOSPITAL_COMMUNITY)
Admission: RE | Admit: 2023-10-11 | Discharge: 2023-10-11 | Disposition: A | Discharge status: Home / Self Care | Payer: Medicaid Other | Source: Ambulatory Visit | Attending: Obstetrics and Gynecology | Admitting: Obstetrics and Gynecology

## 2023-10-11 ENCOUNTER — Encounter: Payer: Self-pay | Admitting: Obstetrics and Gynecology

## 2023-10-11 VITALS — BP 95/64 | HR 81 | Wt 176.0 lb

## 2023-10-11 DIAGNOSIS — Z3A24 24 weeks gestation of pregnancy: Secondary | ICD-10-CM

## 2023-10-11 DIAGNOSIS — O99891 Other specified diseases and conditions complicating pregnancy: Secondary | ICD-10-CM

## 2023-10-11 DIAGNOSIS — Z3480 Encounter for supervision of other normal pregnancy, unspecified trimester: Secondary | ICD-10-CM

## 2023-10-11 DIAGNOSIS — M549 Dorsalgia, unspecified: Secondary | ICD-10-CM

## 2023-10-11 LAB — POCT URINALYSIS DIPSTICK OB
Blood, UA: NEGATIVE
Glucose, UA: NEGATIVE
Ketones, UA: NEGATIVE
Leukocytes, UA: NEGATIVE
Nitrite, UA: NEGATIVE
POC,PROTEIN,UA: NEGATIVE

## 2023-10-11 NOTE — Progress Notes (Unsigned)
   PRENATAL VISIT NOTE  Subjective:  Vickie Sutton is a 22 y.o. [phone] at [redacted]w[redacted]d being seen today for ongoing prenatal care.  She is currently monitored for the following issues for this low-risk pregnancy and has POTS (postural orthostatic tachycardia syndrome); Syncope; Asthma; History of Roux-en-Y gastric bypass; PCOS (polycystic ovarian syndrome); Jerky body movements; Depression with anxiety; and Encounter for supervision of normal pregnancy, antepartum on their problem list.  Patient reports  back pain daily and some vaginal pressure coming and going  .  Contractions: Irritability. Vag. Bleeding: None.  Movement: Present. Denies leaking of fluid.   The following portions of the patient's history were reviewed and updated as appropriate: allergies, current medications, past family history, past medical history, past social history, past surgical history and problem list.   Objective:   Vitals:   10/11/23 1055  BP: 95/64  Pulse: 81  Weight: 176 lb (79.8 kg)    Fetal Status: Fetal Heart Rate (bpm): 136 Fundal Height: 24 cm Movement: Present     General:  Alert, oriented and cooperative. Patient is in no acute distress.  Skin: Skin is warm and dry. No rash noted.   Cardiovascular: Normal heart rate noted  Respiratory: Normal respiratory effort, no problems with respiration noted  Abdomen: Soft, gravid, appropriate for gestational age.  Pain/Pressure: Present     Pelvic: Cervical exam deferred        Extremities: Normal range of motion.  Edema: None  Mental Status: Normal mood and affect. Normal behavior. Normal judgment and thought content.   Assessment and Plan:  Pregnancy: G3P1102 at [redacted]w[redacted]d 1. Supervision of other normal pregnancy, antepartum BP and FHR normal   - POC Urinalysis Dipstick OB  2. Back pain affecting pregnancy in second trimester Discussed cramping vs, msk pain. Encouraged hydration, Supportive measures discussed with strict precautions when to follow up or go to  hospital.   - Cervicovaginal ancillary only - POC Urinalysis Dipstick OB  3. [redacted] weeks gestation of pregnancy    Preterm labor symptoms and general obstetric precautions including but not limited to vaginal bleeding, contractions, leaking of fluid and fetal movement were reviewed in detail with the patient. Please refer to After Visit Summary for other counseling recommendations.   Return in about 4 weeks (around 11/08/2023) for OB VISIT (MD or APP), 2 hr GTT.  Future Appointments  Date Time Provider Department Center  11/08/2023  8:30 AM [facility]  11/08/2023  8:50 AM Myna Hidalgo, DO [facility]    Albertine Grates, FNP

## 2023-10-14 LAB — CERVICOVAGINAL ANCILLARY ONLY
Bacterial Vaginitis (gardnerella): NEGATIVE
Candida Glabrata: NEGATIVE
Candida Vaginitis: NEGATIVE
Comment: NEGATIVE
Comment: NEGATIVE
Comment: NEGATIVE

## 2023-10-25 ENCOUNTER — Other Ambulatory Visit: Payer: Self-pay | Admitting: Obstetrics & Gynecology

## 2023-10-25 ENCOUNTER — Telehealth: Payer: Self-pay

## 2023-10-25 MED ORDER — CYCLOBENZAPRINE HCL 10 MG PO TABS: 10.0000 mg | ORAL_TABLET | Freq: Three times a day (TID) | ORAL | 1 refills | Status: AC | PRN

## 2023-10-25 NOTE — Progress Notes (Signed)
Denies urinary complaints No fever/chills Good fetal movement  Rx sent in for flexeril to help with back pain  Reviewed precautions- should she note worsening of symptoms or other acute changes advised pt to go to [facility]  Myna Hidalgo, DO Attending Obstetrician & Gynecologist, [facility] for [employer], [facility]

## 2023-10-25 NOTE — Telephone Encounter (Signed)
Patient called with complaint of back pain that won't go away and goes from her spine down her leg and is worse at night. She currently rates it a 4 and has taken tylenol for it. Patient denies any uterine tightening and pressure. Denies bleeding and leaking of fluid. States baby is moving a little less than normal. Reviewed how to do kick counts. Patient denies burning with urination but states she goes frequently and dribbles a little bit. Recent urine dip in the office was negative on 10/11/23. Reviewed with Dr. Charlotta Newton. Patient advised to try using a heating pad and prescription will be sent to [facility] pharmacy in [place] for muscle relaxer. Patient is to seek care at [facility] if these measures do not help.

## 2023-11-08 ENCOUNTER — Encounter: Payer: Self-pay | Admitting: Obstetrics & Gynecology

## 2023-11-08 ENCOUNTER — Ambulatory Visit (INDEPENDENT_AMBULATORY_CARE_PROVIDER_SITE_OTHER): Payer: Medicaid Other | Admitting: Obstetrics & Gynecology

## 2023-11-08 ENCOUNTER — Other Ambulatory Visit: Payer: Medicaid Other

## 2023-11-08 VITALS — BP 114/57 | HR 103 | Wt 180.0 lb

## 2023-11-08 DIAGNOSIS — Z3A28 28 weeks gestation of pregnancy: Secondary | ICD-10-CM | POA: Diagnosis not present

## 2023-11-08 DIAGNOSIS — Z23 Encounter for immunization: Secondary | ICD-10-CM | POA: Diagnosis not present

## 2023-11-08 DIAGNOSIS — Z1332 Encounter for screening for maternal depression: Secondary | ICD-10-CM

## 2023-11-08 DIAGNOSIS — Z3480 Encounter for supervision of other normal pregnancy, unspecified trimester: Secondary | ICD-10-CM

## 2023-11-08 DIAGNOSIS — Z131 Encounter for screening for diabetes mellitus: Secondary | ICD-10-CM

## 2023-11-08 DIAGNOSIS — Z3483 Encounter for supervision of other normal pregnancy, third trimester: Secondary | ICD-10-CM | POA: Diagnosis not present

## 2023-11-08 NOTE — Progress Notes (Signed)
LOW-RISK PREGNANCY VISIT Patient name: Vickie Sutton MRN [id number]  Date of birth: March 12, 2001 Chief Complaint:   Routine Prenatal Visit (PN2 today, continued back pain, vaginal pressure)  History of Present Illness:   Vickie Sutton is a 22 y.o. G51P1102 female at [redacted]w[redacted]d with an Estimated Date of Delivery: 01/25/24 being seen today for ongoing management of a low-risk pregnancy.   -Anxiety- no meds -PTOS -h/o Roux en Y- checking fastings mostly low 90s, occasionally as high as 115s -h/o FGR     11/08/2023    9:10 AM 07/24/2023   11:08 AM 01/11/2023    9:46 AM 09/07/2022    9:05 AM 08/01/2022   11:01 AM  Depression screen PHQ 2/9  Decreased Interest 0 0  1 0  Down, Depressed, Hopeless 0 0 1 1 1   PHQ - 2 Score 0 0 1 2 1   Altered sleeping 1 0 0 1 1  Tired, decreased energy 3 1 1 1 3   Change in appetite 0 0 0 0 0  Feeling bad or failure about yourself  0 0 0 0 0  Trouble concentrating 0 0 0 0 0  Moving slowly or fidgety/restless 0 0 0 0 1  Suicidal thoughts 0 0 0 0 0  PHQ-9 Score 4 1 2 4 6   Difficult doing work/chores   Not difficult at all      Today she reports occasional contractions. Contractions: Irritability. Vag. Bleeding: None.  Movement: Present. denies leaking of fluid. Review of Systems:   Pertinent items are noted in HPI Denies abnormal vaginal discharge w/ itching/odor/irritation, headaches, visual changes, shortness of breath, chest pain, abdominal pain, severe nausea/vomiting, or problems with urination or bowel movements unless otherwise stated above. Pertinent History Reviewed:  Reviewed past medical,surgical, social, obstetrical and family history.  Reviewed problem list, medications and allergies.  Physical Assessment:   Vitals:   11/08/23 0904  BP: (!) 114/57  Pulse: (!) 103  Weight: 180 lb (81.6 kg)  Body mass index is 30.9 kg/m.        Physical Examination:   General appearance: Well appearing, and in no distress  Mental status: Alert, oriented to  person, place, and time  Skin: Warm & dry  Respiratory: Normal respiratory effort, no distress  Abdomen: Soft, gravid, nontender  Pelvic: Cervical exam performed  On speculum exam, cerivx appears closed.  SVE: Dilation: Closed Effacement (%): Thick Station: -3  Extremities: Edema: None  Psych:  mood and affect appropriate  Fetal Status: Fetal Heart Rate (bpm): 140 Fundal Height: 30 cm Movement: Present    Chaperone: Malachy Mood    No results found for this or any previous visit (from the past 24 hour(s)).   Assessment & Plan:  1) Low-risk pregnancy G3P1102 at [redacted]w[redacted]d with an Estimated Date of Delivery: 01/25/24   -Anxiety- no meds -PTOS -h/o Roux en Y- checking fastings mostly low 90s, occasionally as high as 115s -h/o FGR  Reviewed preterm labor/braxton hicks/pelvic pressure concerns.  Discussed conservative management.  No evidence of labor on exam.   Meds: No orders of the defined types were placed in this encounter.  Labs/procedures today: PN2  Plan:  Continue routine obstetrical care  Next visit: prefers in person    Reviewed: Preterm labor symptoms and general obstetric precautions including but not limited to vaginal bleeding, contractions, leaking of fluid and fetal movement were reviewed in detail with the patient.  All questions were answered. Pt has home bp cuff. Check bp weekly, let us know if >140/90.  Follow-up: Return in about 2 weeks (around 11/22/2023) for LROB visit.  Orders Placed This Encounter  Procedures   Flu vaccine trivalent PF, 6mos and older(Flulaval,Afluria,Fluarix,Fluzone)    Myna Hidalgo, DO Attending Obstetrician & Gynecologist, [facility] for [employer], [facility]

## 2023-11-09 LAB — CBC
Hematocrit: 30.4 % — ABNORMAL LOW (ref 34.0–46.6)
Hemoglobin: 9.5 g/dL — ABNORMAL LOW (ref 11.1–15.9)
MCH: 24.5 pg — ABNORMAL LOW (ref 26.6–33.0)
MCHC: 31.3 g/dL — ABNORMAL LOW (ref 31.5–35.7)
MCV: 79 fL (ref 79–97)
Platelets: 274 10*3/uL (ref 150–450)
RBC: 3.87 x10E6/uL (ref 3.77–5.28)
RDW: 14.3 % (ref 11.7–15.4)
WBC: 10.3 10*3/uL (ref 3.4–10.8)

## 2023-11-09 LAB — RPR: RPR Ser Ql: NONREACTIVE

## 2023-11-09 LAB — GLUCOSE TOLERANCE, 2 HOURS W/ 1HR
Glucose, 1 hour: 169 mg/dL (ref 70–179)
Glucose, 2 hour: 65 mg/dL — ABNORMAL LOW (ref 70–152)
Glucose, Fasting: 80 mg/dL (ref 70–91)

## 2023-11-09 LAB — ANTIBODY SCREEN: Antibody Screen: NEGATIVE

## 2023-11-09 LAB — HIV ANTIBODY (ROUTINE TESTING W REFLEX): HIV Screen 4th Generation wRfx: NONREACTIVE

## 2023-11-18 ENCOUNTER — Telehealth: Payer: Self-pay | Admitting: *Deleted

## 2023-11-18 ENCOUNTER — Encounter: Payer: Self-pay | Admitting: Women's Health

## 2023-11-18 NOTE — Telephone Encounter (Signed)
Letter sent via my chart

## 2023-11-18 NOTE — Telephone Encounter (Signed)
Patient is calling stating that she needs a letter stating when her due date is on our letter head and doctors sign put into her my chart.

## 2023-11-26 ENCOUNTER — Encounter: Payer: Self-pay | Admitting: Women's Health

## 2023-11-26 ENCOUNTER — Ambulatory Visit: Payer: Medicaid Other | Admitting: Women's Health

## 2023-11-26 VITALS — BP 107/66 | HR 113 | Wt 183.0 lb

## 2023-11-26 DIAGNOSIS — Z23 Encounter for immunization: Secondary | ICD-10-CM

## 2023-11-26 DIAGNOSIS — Z3480 Encounter for supervision of other normal pregnancy, unspecified trimester: Secondary | ICD-10-CM

## 2023-11-26 DIAGNOSIS — Z3483 Encounter for supervision of other normal pregnancy, third trimester: Secondary | ICD-10-CM

## 2023-11-26 DIAGNOSIS — Z3A31 31 weeks gestation of pregnancy: Secondary | ICD-10-CM

## 2023-11-26 MED ORDER — VALACYCLOVIR HCL 1 G PO TABS: 2000.0000 mg | ORAL_TABLET | Freq: Two times a day (BID) | ORAL | 3 refills | Status: AC

## 2023-11-26 NOTE — Patient Instructions (Signed)
Vickie Sutton, thank you for choosing our office today! We appreciate the opportunity to meet your healthcare needs. You may receive a short survey by mail, e-mail, or through MyChart. If you are happy with your care we would appreciate if you could take just a few minutes to complete the survey questions. We read all of your comments and take your feedback very seriously. Thank you again for choosing our office.  [facility]  [facility] [place] ([address] [place], [place] [address]) Entrance C, located off of [address] Free 24/7 valet parking   CLASSES: Go to [email or web address] to register for classes (childbirth, breastfeeding, waterbirth, infant CPR, daddy bootcamp, etc.)  Call the office ([phone]) or go to [facility] if: You begin to have strong, frequent contractions Your water breaks.  Sometimes it is a big gush of fluid, sometimes it is just a trickle that keeps getting your panties wet or running down your legs You have vaginal bleeding.  It is normal to have a small amount of spotting if your cervix was checked.  You don't feel your baby moving like normal.  If you don't, get you something to eat and drink and lay down and focus on feeling your baby move.   If your baby is still not moving like normal, you should call the office or go to [facility].  Call the office ([phone]) or go to [facility] for these signs of pre-eclampsia: Severe headache that does not go away with Tylenol Visual changes- seeing spots, double, blurred vision Pain under your right breast or upper abdomen that does not go away with Tums or heartburn medicine Nausea and/or vomiting Severe swelling in your hands, feet, and face   Tdap Vaccine It is recommended that you get the Tdap vaccine during the third trimester of EACH pregnancy to help protect your baby from getting pertussis (whooping cough) 27-36 weeks is the BEST time to do  this so that you can pass the protection on to your baby. During pregnancy is better than after pregnancy, but if you are unable to get it during pregnancy it will be offered at the hospital.  You can get this vaccine with us, at the health department, your family doctor, or some local pharmacies Everyone who will be around your baby should also be up-to-date on their vaccines before the baby comes. Adults (who are not pregnant) only need 1 dose of Tdap during adulthood.   [place] Pediatricians/Family Doctors [facility] ([facility]): [address], [phone]           [facility]: [address], [phone]                [facility] ([facility]): [address], [phone] (call to ask if accepting patients) [facility]: [address], [place], [phone]    [place] Pediatricians/Family Doctors [facility] ([facility]): [address], [phone] [facility]: [address], [phone] [facility]: [address], [phone]  [place] [facility]  [facility] ([facility]): [phone] [facility]: [address], [phone]   [place] Family Doctors [facility]: [address], [phone]  [place] [facility]  [facility]: [address], [phone]  Home Blood Pressure Monitoring for Patients   Your provider has recommended that you check your   blood pressure (BP) at least once a week at home. If you do not have a blood pressure cuff at home, one will be provided for you. Contact your provider if you have not received your monitor within 1 week.   Helpful Tips for Accurate Home Blood Pressure Checks  Don't smoke, exercise, or drink caffeine 30 minutes before checking your BP Use the restroom before checking your BP (a full bladder can raise your  pressure) Relax in a comfortable upright chair Feet on the ground Left arm resting comfortably on a flat surface at the level of your heart Legs uncrossed Back supported Sit quietly and don't talk Place the cuff on your bare arm Adjust snuggly, so that only two fingertips can fit between your skin and the top of the cuff Check 2 readings separated by at least one minute Keep a log of your BP readings For a visual, please reference this diagram: http://ccnc.care/bpdiagram  Provider Name: [facility]     Phone: [phone]  Zone 1: ALL CLEAR  Continue to monitor your symptoms:  BP reading is less than 140 (top number) or less than 90 (bottom number)  No right upper stomach pain No headaches or seeing spots No feeling nauseated or throwing up No swelling in face and hands  Zone 2: CAUTION Call your doctor's office for any of the following:  BP reading is greater than 140 (top number) or greater than 90 (bottom number)  Stomach pain under your ribs in the middle or right side Headaches or seeing spots Feeling nauseated or throwing up Swelling in face and hands  Zone 3: EMERGENCY  Seek immediate medical care if you have any of the following:  BP reading is greater than160 (top number) or greater than 110 (bottom number) Severe headaches not improving with Tylenol Serious difficulty catching your breath Any worsening symptoms from Zone 2  Preterm Labor and Birth Information  The normal length of a pregnancy is 39-41 weeks. Preterm labor is when labor starts before 37 completed weeks of pregnancy. What are the risk factors for preterm labor? Preterm labor is more likely to occur in women who: Have certain infections during pregnancy such as a bladder infection, sexually transmitted infection, or infection inside the uterus (chorioamnionitis). Have a shorter-than-normal cervix. Have gone into preterm labor before. Have had surgery on their cervix. Are younger than age 17  or older than age 35. Are African American. Are pregnant with twins or multiple babies (multiple gestation). Take street drugs or smoke while pregnant. Do not gain enough weight while pregnant. Became pregnant shortly after having been pregnant. What are the symptoms of preterm labor? Symptoms of preterm labor include: Cramps similar to those that can happen during a menstrual period. The cramps may happen with diarrhea. Pain in the abdomen or lower back. Regular uterine contractions that may feel like tightening of the abdomen. A feeling of increased pressure in the pelvis. Increased watery or bloody mucus discharge from the vagina. Water breaking (ruptured amniotic sac). Why is it important to recognize signs of preterm labor? It is important to recognize signs of preterm labor because babies who are born prematurely may not be fully developed. This can put them at an increased risk for: Long-term (chronic) heart and lung problems. Difficulty immediately after birth with regulating body systems, including blood sugar, body temperature, heart rate, and breathing rate. Bleeding in the brain. Cerebral palsy. Learning difficulties. Death. These risks are highest for babies who are born before 34 weeks   of pregnancy. How is preterm labor treated? Treatment depends on the length of your pregnancy, your condition, and the health of your baby. It may involve: Having a stitch (suture) placed in your cervix to prevent your cervix from opening too early (cerclage). Taking or being given medicines, such as: Hormone medicines. These may be given early in pregnancy to help support the pregnancy. Medicine to stop contractions. Medicines to help mature the baby's lungs. These may be prescribed if the risk of delivery is high. Medicines to prevent your baby from developing cerebral palsy. If the labor happens before 34 weeks of pregnancy, you may need to stay in the hospital. What should I do if I  think I am in preterm labor? If you think that you are going into preterm labor, call your health care provider right away. How can I prevent preterm labor in future pregnancies? To increase your chance of having a full-term pregnancy: Do not use any tobacco products, such as cigarettes, chewing tobacco, and e-cigarettes. If you need help quitting, ask your health care provider. Do not use street drugs or medicines that have not been prescribed to you during your pregnancy. Talk with your health care provider before taking any herbal supplements, even if you have been taking them regularly. Make sure you gain a healthy amount of weight during your pregnancy. Watch for infection. If you think that you might have an infection, get it checked right away. Make sure to tell your health care provider if you have gone into preterm labor before. This information is not intended to replace advice given to you by your health care provider. Make sure you discuss any questions you have with your health care provider. Document Revised: 04/03/2019 Document Reviewed: 05/02/2016 Elsevier Patient Education  2020 Elsevier Inc.   

## 2023-11-26 NOTE — Progress Notes (Signed)
LOW-RISK PREGNANCY VISIT Patient name: Vickie Sutton MRN [id number]  Date of birth: 2001/01/12 Chief Complaint:   Routine Prenatal Visit (Tdap today)  History of Present Illness:   Vickie Sutton is a 22 y.o. [phone] female at [redacted]w[redacted]d with an Estimated Date of Delivery: 01/25/24 being seen today for ongoing management of a low-risk pregnancy.   Today she reports  fever blisters . Contractions: Not present.  .  Movement: Present. denies leaking of fluid.     11/08/2023    9:10 AM 07/24/2023   11:08 AM 01/11/2023    9:46 AM 09/07/2022    9:05 AM 08/01/2022   11:01 AM  Depression screen PHQ 2/9  Decreased Interest 0 0  1 0  Down, Depressed, Hopeless 0 0 1 1 1   PHQ - 2 Score 0 0 1 2 1   Altered sleeping 1 0 0 1 1  Tired, decreased energy 3 1 1 1 3   Change in appetite 0 0 0 0 0  Feeling bad or failure about yourself  0 0 0 0 0  Trouble concentrating 0 0 0 0 0  Moving slowly or fidgety/restless 0 0 0 0 1  Suicidal thoughts 0 0 0 0 0  PHQ-9 Score 4 1 2 4 6   Difficult doing work/chores   Not difficult at all          11/08/2023    9:11 AM 07/24/2023   11:08 AM 01/11/2023    9:48 AM 09/07/2022    9:06 AM  GAD 7 : Generalized Anxiety Score  Nervous, Anxious, on Edge 1 0 1 1  Control/stop worrying 0 0 0 1  Worry too much - different things 0 0 0 1  Trouble relaxing 1 0 0 0  Restless 0 0 0 0  Easily annoyed or irritable 1 1 1 1   Afraid - awful might happen 0 0 0 0  Total GAD 7 Score 3 1 2 4       Review of Systems:   Pertinent items are noted in HPI Denies abnormal vaginal discharge w/ itching/odor/irritation, headaches, visual changes, shortness of breath, chest pain, abdominal pain, severe nausea/vomiting, or problems with urination or bowel movements unless otherwise stated above. Pertinent History Reviewed:  Reviewed past medical,surgical, social, obstetrical and family history.  Reviewed problem list, medications and allergies. Physical Assessment:   Vitals:   11/26/23 1348   BP: 107/66  Pulse: (!) 113  Weight: 183 lb (83 kg)  Body mass index is 31.41 kg/m.        Physical Examination:   General appearance: Well appearing, and in no distress  Mental status: Alert, oriented to person, place, and time  Skin: Warm & dry  Cardiovascular: Normal heart rate noted  Respiratory: Normal respiratory effort, no distress  Abdomen: Soft, gravid, nontender  Pelvic: Cervical exam deferred         Extremities: Edema: None  Fetal Status: Fetal Heart Rate (bpm): 140 Fundal Height: 31 cm Movement: Present    Chaperone: N/A   No results found for this or any previous visit (from the past 24 hour(s)).  Assessment & Plan:  1) Low-risk pregnancy G3P1102 at [redacted]w[redacted]d with an Estimated Date of Delivery: 01/25/24   2) Fever blisters, rx valtrex  3) H/O FGR> EFW 36wk  4) H/O Roux-en-Y    Meds:  Meds ordered this encounter  Medications   valACYclovir (VALTREX) 1000 MG tablet    Sig: Take 2 tablets (2,000 mg total) by mouth 2 (two) times daily.  X 1 day    Dispense:  4 tablet    Refill:  3   Labs/procedures today: Tdap  Plan:  Continue routine obstetrical care  Next visit: prefers in person    Reviewed: Preterm labor symptoms and general obstetric precautions including but not limited to vaginal bleeding, contractions, leaking of fluid and fetal movement were reviewed in detail with the patient.  All questions were answered. Does have home bp cuff. Office bp cuff given: not applicable. Check bp weekly, let us know if consistently >140 and/or >90.  Follow-up: Return for As scheduled; add EFW u/s to 36wk appt.  Future Appointments  Date Time Provider Department Center  12/10/2023  8:30 AM Cheral Marker, CNM [facility]  12/24/2023  9:10 AM Cheral Marker, CNM [facility]  01/01/2024  8:30 AM Arabella Merles, CNM [facility]  01/08/2024  8:30 AM Arabella Merles, CNM [facility]  01/15/2024  8:30 AM Arabella Merles, CNM [facility]  01/22/2024  8:30  AM Arabella Merles, CNM [facility]    No orders of the defined types were placed in this encounter.  Cheral Marker CNM, [facility] 11/26/2023 2:16 PM

## 2023-12-02 ENCOUNTER — Inpatient Hospital Stay (HOSPITAL_COMMUNITY)
Admission: AD | Admit: 2023-12-02 | Discharge: 2023-12-02 | Disposition: A | Discharge status: Home / Self Care | Payer: Medicaid Other | Attending: Obstetrics and Gynecology | Admitting: Obstetrics and Gynecology

## 2023-12-02 ENCOUNTER — Other Ambulatory Visit: Payer: Self-pay

## 2023-12-02 DIAGNOSIS — O26853 Spotting complicating pregnancy, third trimester: Secondary | ICD-10-CM | POA: Insufficient documentation

## 2023-12-02 DIAGNOSIS — O99343 Other mental disorders complicating pregnancy, third trimester: Secondary | ICD-10-CM | POA: Diagnosis not present

## 2023-12-02 DIAGNOSIS — G90A Postural orthostatic tachycardia syndrome (POTS): Secondary | ICD-10-CM | POA: Diagnosis not present

## 2023-12-02 DIAGNOSIS — Z3A32 32 weeks gestation of pregnancy: Secondary | ICD-10-CM | POA: Insufficient documentation

## 2023-12-02 DIAGNOSIS — F419 Anxiety disorder, unspecified: Secondary | ICD-10-CM | POA: Diagnosis not present

## 2023-12-02 DIAGNOSIS — O26893 Other specified pregnancy related conditions, third trimester: Secondary | ICD-10-CM | POA: Insufficient documentation

## 2023-12-02 DIAGNOSIS — O368130 Decreased fetal movements, third trimester, not applicable or unspecified: Secondary | ICD-10-CM | POA: Insufficient documentation

## 2023-12-02 DIAGNOSIS — M549 Dorsalgia, unspecified: Secondary | ICD-10-CM | POA: Insufficient documentation

## 2023-12-02 DIAGNOSIS — O99353 Diseases of the nervous system complicating pregnancy, third trimester: Secondary | ICD-10-CM | POA: Diagnosis not present

## 2023-12-02 DIAGNOSIS — O99891 Other specified diseases and conditions complicating pregnancy: Secondary | ICD-10-CM

## 2023-12-02 DIAGNOSIS — N939 Abnormal uterine and vaginal bleeding, unspecified: Secondary | ICD-10-CM

## 2023-12-02 LAB — WET PREP, GENITAL
Clue Cells Wet Prep HPF POC: NONE SEEN
Sperm: NONE SEEN
Trich, Wet Prep: NONE SEEN
WBC, Wet Prep HPF POC: >=10 — AB (ref <10)
Yeast Wet Prep HPF POC: NONE SEEN

## 2023-12-02 MED ORDER — ACETAMINOPHEN 500 MG PO TABS
1000.0000 mg | ORAL_TABLET | Freq: Once | ORAL | Status: AC
  Administered 2023-12-02: 1000 mg via ORAL
  Filled 2023-12-02: qty 2

## 2023-12-02 MED ORDER — CYCLOBENZAPRINE HCL 5 MG PO TABS
10.0000 mg | ORAL_TABLET | Freq: Three times a day (TID) | ORAL | Status: DC | PRN
  Administered 2023-12-02: 10 mg via ORAL
  Filled 2023-12-02: qty 2

## 2023-12-02 NOTE — MAU Provider Note (Addendum)
History     [id number]  Arrival date and time: 12/02/23 1843    Chief Complaint  Patient presents with   Decreased Fetal Movement   Back Pain   pelvic pressure     HPI Vickie Sutton is a 22 y.o. at [redacted]w[redacted]d by LMP who presents for concerns regarding fetal movement.  She notes that normally she feels more than 15 movements per hour and now it seems less.  She felt movement on her way over to the hospital, but only 4-5x.  She also notes that today she saw some light pink spotting.  Denies BRB, denies recent IC.  Denies vaginal discharge, itching or irritation.  She has noted some pelvic pressure and back pain, which has been an ongoing issue.  She is currently taking Flexeril and tylenol as needed for her back pain.  She has not yet taken any today.   Pt receives care with [facility]  Pregnancy complicated by: -prior Roux-en-Y -POTS -anxiety- no meds currently  Past Medical History:  Diagnosis Date   ADD (attention deficit disorder)    Anxiety    Asthma    Depression    Migraine    Panic attacks    Polycystic ovarian syndrome    POTS (postural orthostatic tachycardia syndrome)    Syncope    Neurocardiogenic suspected   Vasovagal syncope     Past Surgical History:  Procedure Laterality Date   ESOPHAGOGASTRODUODENOSCOPY     LAPAROTOMY N/A 09/07/2020   Procedure: EXPLORATORY LAPAROTOMY WITH EXTRACTION  OVARIAN CYST;  Surgeon: Lazaro Arms, MD;  Location: [facility] OR;  Service: Gynecology;  Laterality: N/A;   OVARIAN CYST SURGERY Right    ROUX-EN-Y GASTRIC BYPASS     August 2020 - [facility]   TOOTH EXTRACTION N/A 12/12/2015   Procedure: EXTRACTION MOLARS - teeth one, sixteen, seventeen and thirty-two;  Surgeon: Ocie Doyne, DDS;  Location: [facility] OR;  Service: Oral Surgery;  Laterality: N/A;    Family History  Problem Relation Age of Onset   Diabetes Mellitus II Maternal Grandmother    Heart disease Maternal Grandmother    Obesity Mother    Phillips Odor White syndrome  Brother    Pneumonia Son    Asthma Son    Seizures Maternal Aunt     No Known Allergies  No current facility-administered medications on file prior to encounter.   Current Outpatient Medications on File Prior to Encounter  Medication Sig Dispense Refill   albuterol (VENTOLIN HFA) 108 (90 Base) MCG/ACT inhaler Inhale 1-2 puffs into the lungs every 4 (four) hours as needed for wheezing or shortness of breath. 8.5 g 4   cyclobenzaprine (FLEXERIL) 10 MG tablet Take 10 mg by mouth 3 (three) times daily as needed for muscle spasms.     Prenatal Vit-Fe Fumarate-FA (PRENATAL VITAMIN PO) Take by mouth.     ACCU-CHEK GUIDE test strip      Accu-Chek Softclix Lancets lancets 1 each 3 (three) times daily.     cyclobenzaprine (FLEXERIL) 5 MG tablet Take 5 mg by mouth 3 (three) times daily as needed for muscle spasms. (Patient not taking: Reported on 11/26/2023)     sertraline (ZOLOFT) 50 MG tablet Take 1 tablet (50 mg total) by mouth daily. 30 tablet 12   valACYclovir (VALTREX) 1000 MG tablet Take 2 tablets (2,000 mg total) by mouth 2 (two) times daily. X 1 day 4 tablet 3     Review of Systems  Constitutional: Negative.   HENT: Negative.  Eyes: Negative.   Respiratory: Negative.    Cardiovascular: Negative.   Gastrointestinal: Negative.   Genitourinary: Negative.   Musculoskeletal:  Positive for back pain.  Skin: Negative.   Neurological: Negative.   Endo/Heme/Allergies: Negative.   Psychiatric/Behavioral: Negative.     Pertinent positives and negative per HPI, all others reviewed and negative  Physical Exam   BP (!) 95/54 (BP Location: Right Arm)   Pulse (!) 113   Temp 98.2 F (36.8 C) (Oral)   Resp 16   Ht 5\' 4"  (1.626 m)   Wt 79.8 kg   LMP 04/20/2023   SpO2 100%   BMI 30.21 kg/m   Patient Vitals for the past 24 hrs:  BP Temp Temp src Pulse Resp SpO2 Height Weight  12/02/23 1920 -- -- -- -- -- -- 5\' 4"  (1.626 m) 79.8 kg  12/02/23 1910 (!) 95/54 98.2 F (36.8 C) Oral (!)  113 16 100 % -- --    Physical Exam Constitutional:      Appearance: Normal appearance.  Cardiovascular:     Rate and Rhythm: Normal rate and regular rhythm.  Pulmonary:     Effort: Pulmonary effort is normal.     Breath sounds: Normal breath sounds.  Abdominal:     Comments: Gravid, soft, no rebound, no guarding  Genitourinary:    Comments: Normal external genitalia, normal vaginal mucosa- no discharge or abnormalities noted. Cervix appears friable and closed.  On SVE: closed/thick/-3 Musculoskeletal:        General: No swelling or tenderness.     Cervical back: Neck supple.     Comments: Notes low back pain  Skin:    General: Skin is warm and dry.  Neurological:     General: No focal deficit present.     Mental Status: She is alert and oriented to person, place, and time.  Psychiatric:        Mood and Affect: Mood normal.        Behavior: Behavior normal.        Thought Content: Thought content normal.        Judgment: Judgment normal.      Cervical Exam Dilation: Closed Exam by:: Ozan, MD    FHT Baseline 130, moderate variability, + accels, no decels Toco: no contractions Cat: I  Labs No results found for this or any previous visit (from the past 24 hour(s)).  Imaging No results found.  MAU Course  Procedures Lab Orders         Wet prep, genital    Meds ordered this encounter  Medications   cyclobenzaprine (FLEXERIL) tablet 10 mg   acetaminophen (TYLENOL) tablet 1,000 mg   Imaging Orders  No imaging studies ordered today    MDM -Exam completed- no bleeding, cervix  closed -Reactive NST -po hydration, tylenol, flexeril ordered -wet prep obtained  Assessment and Plan   1. Back pain in pregnancy   2. Vaginal spotting   3. [redacted] weeks gestation of pregnancy   4. Decreased fetal movements in third trimester, single or unspecified fetus    -reassured pt of reactive NST and reviewed fetal kick counts/movements.  Pt notes good fetal movement -no  vaginal bleeding noted on exam, wet prep pending -continue tylenol/muscle relaxer as needed for back pain.  Ok to use heating pack -pt stable, plan for discharge home -follow up outpatient as scheduled  Care turned over to Dr. Judd Lien, if wet prep negative and await improvement of back pain  Myna Hidalgo, DO Attending  Obstetrician & Gynecologist, Biochemist, clinical for [employer], [facility]   Update: Wet prep negative.  D/c per Dr. Lawana Chambers recommendations.   Mittie Bodo, MD Family Medicine - Obstetrics Fellow

## 2023-12-02 NOTE — MAU Note (Signed)
Vickie Sutton is a 22 y.o. at [redacted]w[redacted]d here in MAU reporting: She hasn't felt her baby moving as much since this morning, however, she reports that her baby is moving a bit more now. She also reports she had some light pink/reddish spotting about an hour and half ago when she was at work. She also reports that she feels her baby is lower than normal. She also reports she's having pelvic pressure as well as back pain that radiates to her left leg. She had one vomiting occurrence an hour ago as well.     Pain score:  Pelvic 7/10  Back pain 10/10 Vitals:   12/02/23 1910  BP: (!) 95/54  Pulse: (!) 113  Resp: 16  Temp: 98.2 F (36.8 C)  SpO2: 100%     FHT:149 Lab orders placed from triage:  none

## 2023-12-10 ENCOUNTER — Encounter: Payer: Self-pay | Admitting: Women's Health

## 2023-12-10 ENCOUNTER — Ambulatory Visit (INDEPENDENT_AMBULATORY_CARE_PROVIDER_SITE_OTHER): Payer: Medicaid Other | Admitting: Women's Health

## 2023-12-10 VITALS — BP 105/65 | HR 93 | Wt 188.5 lb

## 2023-12-10 DIAGNOSIS — Z3480 Encounter for supervision of other normal pregnancy, unspecified trimester: Secondary | ICD-10-CM

## 2023-12-10 DIAGNOSIS — Z3A33 33 weeks gestation of pregnancy: Secondary | ICD-10-CM

## 2023-12-10 DIAGNOSIS — Z3483 Encounter for supervision of other normal pregnancy, third trimester: Secondary | ICD-10-CM

## 2023-12-10 NOTE — Progress Notes (Signed)
LOW-RISK PREGNANCY VISIT Patient name: Vickie Sutton MRN [id number]  Date of birth: 11/12/2001 Chief Complaint:   Routine Prenatal Visit  History of Present Illness:   Vickie Sutton is a 22 y.o. G36P1102 female at [redacted]w[redacted]d with an Estimated Date of Delivery: 01/25/24 being seen today for ongoing management of a low-risk pregnancy.   Today she reports  went to [facility] 12/9 for dfm, pain, pressure, spotting-everything wnl. Only pressure since. Some pressure sensation top of Rt foot . Contractions: Not present. Vag. Bleeding: None.  Movement: Present. denies leaking of fluid.     11/08/2023    9:10 AM 07/24/2023   11:08 AM 01/11/2023    9:46 AM 09/07/2022    9:05 AM 08/01/2022   11:01 AM  Depression screen PHQ 2/9  Decreased Interest 0 0  1 0  Down, Depressed, Hopeless 0 0 1 1 1   PHQ - 2 Score 0 0 1 2 1   Altered sleeping 1 0 0 1 1  Tired, decreased energy 3 1 1 1 3   Change in appetite 0 0 0 0 0  Feeling bad or failure about yourself  0 0 0 0 0  Trouble concentrating 0 0 0 0 0  Moving slowly or fidgety/restless 0 0 0 0 1  Suicidal thoughts 0 0 0 0 0  PHQ-9 Score 4 1 2 4 6   Difficult doing work/chores   Not difficult at all          11/08/2023    9:11 AM 07/24/2023   11:08 AM 01/11/2023    9:48 AM 09/07/2022    9:06 AM  GAD 7 : Generalized Anxiety Score  Nervous, Anxious, on Edge 1 0 1 1  Control/stop worrying 0 0 0 1  Worry too much - different things 0 0 0 1  Trouble relaxing 1 0 0 0  Restless 0 0 0 0  Easily annoyed or irritable 1 1 1 1   Afraid - awful might happen 0 0 0 0  Total GAD 7 Score 3 1 2 4       Review of Systems:   Pertinent items are noted in HPI Denies abnormal vaginal discharge w/ itching/odor/irritation, headaches, visual changes, shortness of breath, chest pain, abdominal pain, severe nausea/vomiting, or problems with urination or bowel movements unless otherwise stated above. Pertinent History Reviewed:  Reviewed past medical,surgical, social, obstetrical and  family history.  Reviewed problem list, medications and allergies. Physical Assessment:   Vitals:   12/10/23 0846  BP: 105/65  Pulse: 93  Weight: 188 lb 8 oz (85.5 kg)  Body mass index is 32.36 kg/m.        Physical Examination:   General appearance: Well appearing, and in no distress  Mental status: Alert, oriented to person, place, and time  Skin: Warm & dry  Cardiovascular: Normal heart rate noted  Respiratory: Normal respiratory effort, no distress  Abdomen: Soft, gravid, nontender  Pelvic: Cervical exam deferred         Extremities: Edema: None, good pedal pulse Rt foot, no obvious abnormalities  Fetal Status: Fetal Heart Rate (bpm): 142 Fundal Height: 32 cm Movement: Present    Chaperone: N/A   No results found for this or any previous visit (from the past 24 hours).  Assessment & Plan:  1) Low-risk pregnancy G3P1102 at [redacted]w[redacted]d with an Estimated Date of Delivery: 01/25/24   2) H/O FGR, EFW 36w   Meds: No orders of the defined types were placed in this encounter.  Labs/procedures today: declines  RSV vaccine today, maybe next visit  Plan:  Continue routine obstetrical care  Next visit: prefers in person    Reviewed: Preterm labor symptoms and general obstetric precautions including but not limited to vaginal bleeding, contractions, leaking of fluid and fetal movement were reviewed in detail with the patient.  All questions were answered. Does have home bp cuff. Office bp cuff given: not applicable. Check bp weekly, let us know if consistently >140 and/or >90.  Follow-up: Return for As scheduled.  Future Appointments  Date Time Provider Department Center  12/24/2023  9:10 AM Cheral Marker, CNM [facility]  01/01/2024  9:15 AM [facility] IMG 2 [facility] None  01/01/2024 10:10 AM Arabella Merles, CNM [facility]  01/08/2024  8:30 AM Arabella Merles, CNM [facility]  01/15/2024  8:30 AM Arabella Merles, CNM [facility]  01/22/2024  8:30 AM Arabella Merles, CNM  [facility]    No orders of the defined types were placed in this encounter.  Cheral Marker CNM, WHNP-BC 12/10/2023 9:00 AM

## 2023-12-10 NOTE — Patient Instructions (Signed)
Vickie Sutton, thank you for choosing our office today! We appreciate the opportunity to meet your healthcare needs. You may receive a short survey by mail, e-mail, or through MyChart. If you are happy with your care we would appreciate if you could take just a few minutes to complete the survey questions. We read all of your comments and take your feedback very seriously. Thank you again for choosing our office.  [facility]  [facility] [place] ([address] [place], [place] [address]) Entrance C, located off of [address] Free 24/7 valet parking   CLASSES: Go to [email or web address] to register for classes (childbirth, breastfeeding, waterbirth, infant CPR, daddy bootcamp, etc.)  Call the office ([phone]) or go to [facility] if: You begin to have strong, frequent contractions Your water breaks.  Sometimes it is a big gush of fluid, sometimes it is just a trickle that keeps getting your panties wet or running down your legs You have vaginal bleeding.  It is normal to have a small amount of spotting if your cervix was checked.  You don't feel your baby moving like normal.  If you don't, get you something to eat and drink and lay down and focus on feeling your baby move.   If your baby is still not moving like normal, you should call the office or go to [facility].  Call the office ([phone]) or go to [facility] for these signs of pre-eclampsia: Severe headache that does not go away with Tylenol Visual changes- seeing spots, double, blurred vision Pain under your right breast or upper abdomen that does not go away with Tums or heartburn medicine Nausea and/or vomiting Severe swelling in your hands, feet, and face   Tdap Vaccine It is recommended that you get the Tdap vaccine during the third trimester of EACH pregnancy to help protect your baby from getting pertussis (whooping cough) 27-36 weeks is the BEST time to do  this so that you can pass the protection on to your baby. During pregnancy is better than after pregnancy, but if you are unable to get it during pregnancy it will be offered at the hospital.  You can get this vaccine with us, at the health department, your family doctor, or some local pharmacies Everyone who will be around your baby should also be up-to-date on their vaccines before the baby comes. Adults (who are not pregnant) only need 1 dose of Tdap during adulthood.   [place] Pediatricians/Family Doctors [facility] ([facility]): [address], [phone]           [facility]: [address], [phone]                [facility] ([facility]): [address], [phone] (call to ask if accepting patients) [facility]: [address], [place], [phone]    [place] Pediatricians/Family Doctors [facility] ([facility]): [address], [phone] [facility]: [address], [phone] [facility]: [address], [phone]  [place] [facility]  [facility] ([facility]): [phone] [facility]: [address], [phone]   [place] Family Doctors [facility]: [address], [phone]  [place] [facility]  [facility]: [address], [phone]  Home Blood Pressure Monitoring for Patients   Your provider has recommended that you check your   blood pressure (BP) at least once a week at home. If you do not have a blood pressure cuff at home, one will be provided for you. Contact your provider if you have not received your monitor within 1 week.   Helpful Tips for Accurate Home Blood Pressure Checks  Don't smoke, exercise, or drink caffeine 30 minutes before checking your BP Use the restroom before checking your BP (a full bladder can raise your  pressure) Relax in a comfortable upright chair Feet on the ground Left arm resting comfortably on a flat surface at the level of your heart Legs uncrossed Back supported Sit quietly and don't talk Place the cuff on your bare arm Adjust snuggly, so that only two fingertips can fit between your skin and the top of the cuff Check 2 readings separated by at least one minute Keep a log of your BP readings For a visual, please reference this diagram: http://ccnc.care/bpdiagram  Provider Name: [facility]     Phone: [phone]  Zone 1: ALL CLEAR  Continue to monitor your symptoms:  BP reading is less than 140 (top number) or less than 90 (bottom number)  No right upper stomach pain No headaches or seeing spots No feeling nauseated or throwing up No swelling in face and hands  Zone 2: CAUTION Call your doctor's office for any of the following:  BP reading is greater than 140 (top number) or greater than 90 (bottom number)  Stomach pain under your ribs in the middle or right side Headaches or seeing spots Feeling nauseated or throwing up Swelling in face and hands  Zone 3: EMERGENCY  Seek immediate medical care if you have any of the following:  BP reading is greater than160 (top number) or greater than 110 (bottom number) Severe headaches not improving with Tylenol Serious difficulty catching your breath Any worsening symptoms from Zone 2  Preterm Labor and Birth Information  The normal length of a pregnancy is 39-41 weeks. Preterm labor is when labor starts before 37 completed weeks of pregnancy. What are the risk factors for preterm labor? Preterm labor is more likely to occur in women who: Have certain infections during pregnancy such as a bladder infection, sexually transmitted infection, or infection inside the uterus (chorioamnionitis). Have a shorter-than-normal cervix. Have gone into preterm labor before. Have had surgery on their cervix. Are younger than age 17  or older than age 35. Are African American. Are pregnant with twins or multiple babies (multiple gestation). Take street drugs or smoke while pregnant. Do not gain enough weight while pregnant. Became pregnant shortly after having been pregnant. What are the symptoms of preterm labor? Symptoms of preterm labor include: Cramps similar to those that can happen during a menstrual period. The cramps may happen with diarrhea. Pain in the abdomen or lower back. Regular uterine contractions that may feel like tightening of the abdomen. A feeling of increased pressure in the pelvis. Increased watery or bloody mucus discharge from the vagina. Water breaking (ruptured amniotic sac). Why is it important to recognize signs of preterm labor? It is important to recognize signs of preterm labor because babies who are born prematurely may not be fully developed. This can put them at an increased risk for: Long-term (chronic) heart and lung problems. Difficulty immediately after birth with regulating body systems, including blood sugar, body temperature, heart rate, and breathing rate. Bleeding in the brain. Cerebral palsy. Learning difficulties. Death. These risks are highest for babies who are born before 34 weeks   of pregnancy. How is preterm labor treated? Treatment depends on the length of your pregnancy, your condition, and the health of your baby. It may involve: Having a stitch (suture) placed in your cervix to prevent your cervix from opening too early (cerclage). Taking or being given medicines, such as: Hormone medicines. These may be given early in pregnancy to help support the pregnancy. Medicine to stop contractions. Medicines to help mature the baby's lungs. These may be prescribed if the risk of delivery is high. Medicines to prevent your baby from developing cerebral palsy. If the labor happens before 34 weeks of pregnancy, you may need to stay in the hospital. What should I do if I  think I am in preterm labor? If you think that you are going into preterm labor, call your health care provider right away. How can I prevent preterm labor in future pregnancies? To increase your chance of having a full-term pregnancy: Do not use any tobacco products, such as cigarettes, chewing tobacco, and e-cigarettes. If you need help quitting, ask your health care provider. Do not use street drugs or medicines that have not been prescribed to you during your pregnancy. Talk with your health care provider before taking any herbal supplements, even if you have been taking them regularly. Make sure you gain a healthy amount of weight during your pregnancy. Watch for infection. If you think that you might have an infection, get it checked right away. Make sure to tell your health care provider if you have gone into preterm labor before. This information is not intended to replace advice given to you by your health care provider. Make sure you discuss any questions you have with your health care provider. Document Revised: 04/03/2019 Document Reviewed: 05/02/2016 Elsevier Patient Education  2020 Elsevier Inc.   

## 2023-12-24 ENCOUNTER — Other Ambulatory Visit (HOSPITAL_COMMUNITY)
Admission: RE | Admit: 2023-12-24 | Discharge: 2023-12-24 | Disposition: A | Discharge status: Home / Self Care | Payer: Medicaid Other | Source: Ambulatory Visit | Attending: Women's Health | Admitting: Women's Health

## 2023-12-24 ENCOUNTER — Ambulatory Visit: Payer: Medicaid Other | Admitting: Women's Health

## 2023-12-24 ENCOUNTER — Encounter: Payer: Self-pay | Admitting: Women's Health

## 2023-12-24 VITALS — BP 117/73 | HR 108 | Wt 190.4 lb

## 2023-12-24 DIAGNOSIS — Z2911 Encounter for prophylactic immunotherapy for respiratory syncytial virus (RSV): Secondary | ICD-10-CM | POA: Diagnosis not present

## 2023-12-24 DIAGNOSIS — Z3483 Encounter for supervision of other normal pregnancy, third trimester: Secondary | ICD-10-CM

## 2023-12-24 DIAGNOSIS — Z3480 Encounter for supervision of other normal pregnancy, unspecified trimester: Secondary | ICD-10-CM

## 2023-12-24 DIAGNOSIS — Z3A00 Weeks of gestation of pregnancy not specified: Secondary | ICD-10-CM | POA: Insufficient documentation

## 2023-12-24 DIAGNOSIS — N898 Other specified noninflammatory disorders of vagina: Secondary | ICD-10-CM | POA: Diagnosis present

## 2023-12-24 DIAGNOSIS — O26893 Other specified pregnancy related conditions, third trimester: Secondary | ICD-10-CM | POA: Diagnosis present

## 2023-12-24 DIAGNOSIS — R5383 Other fatigue: Secondary | ICD-10-CM | POA: Diagnosis not present

## 2023-12-24 DIAGNOSIS — Z3A35 35 weeks gestation of pregnancy: Secondary | ICD-10-CM | POA: Diagnosis not present

## 2023-12-24 DIAGNOSIS — Z8759 Personal history of other complications of pregnancy, childbirth and the puerperium: Secondary | ICD-10-CM

## 2023-12-24 DIAGNOSIS — O99013 Anemia complicating pregnancy, third trimester: Secondary | ICD-10-CM

## 2023-12-24 LAB — POCT HEMOGLOBIN: Hemoglobin: 8.2 g/dL — AB (ref 11–14.6)

## 2023-12-24 MED ORDER — CYCLOBENZAPRINE HCL 10 MG PO TABS: 10.0000 mg | ORAL_TABLET | Freq: Three times a day (TID) | ORAL | 0 refills | Status: AC | PRN

## 2023-12-24 NOTE — Patient Instructions (Addendum)
 Vickie Sutton, thank you for choosing our office today! We appreciate the opportunity to meet your healthcare needs. You may receive a short survey by mail, e-mail, or through [employer]. If you are happy with your care we would appreciate if you could take just a few minutes to complete the survey questions. We read all of your comments and take your feedback very seriously. Thank you again for choosing our office.  Center for [employer] Healthcare Team at [facility]  [facility] at [facility] ([address] [place], [place] [address]) Entrance C, located off of [address] Free 24/7 valet parking   You can try a 7 night over the counter yeast cream such as Monistat 7. It does not need to be brand name, generic is fine, but please make sure it is a 7 night cream.   CLASSES: Go to [email or web address] to register for classes (childbirth, breastfeeding, waterbirth, infant CPR, daddy bootcamp, etc.)  Call the office [phone]) or go to [facility] if: You begin to have strong, frequent contractions Your water breaks.  Sometimes it is a big gush of fluid, sometimes it is just a trickle that keeps getting your panties wet or running down your legs You have vaginal bleeding.  It is normal to have a small amount of spotting if your cervix was checked.  You don't feel your baby moving like normal.  If you don't, get you something to eat and drink and lay down and focus on feeling your baby move.   If your baby is still not moving like normal, you should call the office or go to [facility].  Call the office [phone]) or go to [facility] for these signs of pre-eclampsia: Severe headache that does not go away with Tylenol  Visual changes- seeing spots, double, blurred vision Pain under your right breast or upper abdomen that does not go away with Tums or heartburn medicine Nausea and/or vomiting Severe swelling in your hands, feet, and face   Tdap Vaccine It is recommended that you  get the Tdap vaccine during the third trimester of EACH pregnancy to help protect your baby from getting pertussis (whooping cough) 27-36 weeks is the BEST time to do this so that you can pass the protection on to your baby. During pregnancy is better than after pregnancy, but if you are unable to get it during pregnancy it will be offered at the hospital.  You can get this vaccine with us , at the health department, your family doctor, or some local pharmacies Everyone who will be around your baby should also be up-to-date on their vaccines before the baby comes. Adults (who are not pregnant) only need 1 dose of Tdap during adulthood.   [facility] Pediatricians/Family Doctors [facility]): [address] [place], [phone]           [facility]: [address], [phone]                [facility]): [address], [phone] (call to ask if accepting patients) [facility]: [address], [place], [phone]    [facility] Pediatricians/Family Doctors [facility]): [phone] [address], [phone] [facility]: [address] [place], [phone] [facility]: [address], [phone]  [facility] Doctors  [facility]): [phone] [facility]: [address] [phone]   [facility] Doctors [facility]: [address], [phone]  [facility] Doctors  [facility]: [phone], [phone]  Home Blood Pressure Monitoring for Patients   Your provider has recommended that you check your blood pressure (BP) at least once a week at home. If you do not have a blood pressure cuff at home, one will be provided for you. Contact your provider if you have not received your monitor within 1 week.   Helpful Tips for Accurate Home Blood  Pressure Checks  Don't smoke, exercise, or drink caffeine 30 minutes before checking your BP Use the restroom before checking your BP (a full bladder can raise your pressure) Relax in a comfortable upright chair Feet on the ground Left arm resting comfortably on a flat surface at the level of your heart Legs uncrossed Back supported Sit quietly and don't talk Place the cuff on your bare arm Adjust snuggly, so that only two fingertips can fit between your skin and the top of the cuff Check 2 readings separated by at least one minute Keep a log of your BP readings For a visual, please reference this diagram: http://ccnc.care/bpdiagram  Provider Name: [facility]     Phone: [phone]  Zone 1: ALL CLEAR  Continue to monitor your symptoms:  BP reading is less than 140 (top number) or less than 90 (bottom number)  No right upper stomach pain No headaches or seeing spots No feeling nauseated or throwing up No swelling in face and hands  Zone 2: CAUTION Call your doctor's office for any of the following:  BP reading is greater than 140 (top number) or greater than 90 (bottom number)  Stomach pain under your ribs in the middle or right side Headaches or seeing spots Feeling nauseated or throwing up Swelling in face and hands  Zone 3: EMERGENCY  Seek immediate medical care if you have any of the following:  BP reading is greater than160 (top number) or greater than 110 (bottom number) Severe headaches not improving with Tylenol  Serious difficulty catching your breath Any worsening symptoms from Zone 2  Preterm Labor and Birth Information  The normal length of a pregnancy is 39-41 weeks. Preterm labor is when labor starts before 37 completed weeks of pregnancy. What are the risk factors for preterm labor? Preterm labor is more likely to occur in women who: Have certain infections during pregnancy such as a bladder infection, sexually transmitted infection, or infection  inside the uterus (chorioamnionitis). Have a shorter-than-normal cervix. Have gone into preterm labor before. Have had surgery on their cervix. Are younger than age 52 or older than age 15. Are African American. Are pregnant with twins or multiple babies (multiple gestation). Take street drugs or smoke while pregnant. Do not gain enough weight while pregnant. Became pregnant shortly after having been pregnant. What are the symptoms of preterm labor? Symptoms of preterm labor include: Cramps similar to those that can happen during a menstrual period. The cramps may happen with diarrhea. Pain in the abdomen or lower back. Regular uterine contractions that may feel like tightening of the abdomen. A feeling of increased pressure in the pelvis. Increased watery or bloody mucus discharge from the vagina. Water breaking (ruptured amniotic sac). Why is it important to recognize signs of preterm labor? It is important to recognize signs of preterm labor because babies who are born prematurely may not be fully developed. This can put them at an increased risk for: Long-term (chronic) heart and lung problems. Difficulty  immediately after birth with regulating body systems, including blood sugar, body temperature, heart rate, and breathing rate. Bleeding in the brain. Cerebral palsy. Learning difficulties. Death. These risks are highest for babies who are born before 34 weeks of pregnancy. How is preterm labor treated? Treatment depends on the length of your pregnancy, your condition, and the health of your baby. It may involve: Having a stitch (suture) placed in your cervix to prevent your cervix from opening too early (cerclage). Taking or being given medicines, such as: Hormone medicines. These may be given early in pregnancy to help support the pregnancy. Medicine to stop contractions. Medicines to help mature the baby's lungs. These may be prescribed if the risk of delivery is  high. Medicines to prevent your baby from developing cerebral palsy. If the labor happens before 34 weeks of pregnancy, you may need to stay in the hospital. What should I do if I think I am in preterm labor? If you think that you are going into preterm labor, call your health care provider right away. How can I prevent preterm labor in future pregnancies? To increase your chance of having a full-term pregnancy: Do not use any tobacco products, such as cigarettes, chewing tobacco, and e-cigarettes. If you need help quitting, ask your health care provider. Do not use street drugs or medicines that have not been prescribed to you during your pregnancy. Talk with your health care provider before taking any herbal supplements, even if you have been taking them regularly. Make sure you gain a healthy amount of weight during your pregnancy. Watch for infection. If you think that you might have an infection, get it checked right away. Make sure to tell your health care provider if you have gone into preterm labor before. This information is not intended to replace advice given to you by your health care provider. Make sure you discuss any questions you have with your health care provider. Document Revised: 04/03/2019 Document Reviewed: 05/02/2016 Elsevier Patient Education  2020 [employer].

## 2023-12-24 NOTE — Progress Notes (Addendum)
 LOW-RISK PREGNANCY VISIT Patient name: Vickie Sutton MRN [id number]  Date of birth: Dec 13, 2001 Chief Complaint:   Routine Prenatal Visit (Low energy / discharge. Want RSV  shot)  History of Present Illness:   Vickie Sutton is a 22 y.o. [phone] female at [redacted]w[redacted]d with an Estimated Date of Delivery: 01/25/24 being seen today for ongoing management of a low-risk pregnancy.   Today she reports  leaking fluid since this weekend, thinks its pee, also has white clumpy discharge and vulvar irritation. No odor. Needs refill on flexeril , uses only prn for back pain. Fatigue, no energy. Can't keep oral Fe down, makes her vomit . Contractions: Not present.  .  Movement: Present. denies leaking of fluid.     11/08/2023    9:10 AM 07/24/2023   11:08 AM 01/11/2023    9:46 AM 09/07/2022    9:05 AM 08/01/2022   11:01 AM  Depression screen PHQ 2/9  Decreased Interest 0 0  1 0  Down, Depressed, Hopeless 0 0 1 1 1   PHQ - 2 Score 0 0 1 2 1   Altered sleeping 1 0 0 1 1  Tired, decreased energy 3 1 1 1 3   Change in appetite 0 0 0 0 0  Feeling bad or failure about yourself  0 0 0 0 0  Trouble concentrating 0 0 0 0 0  Moving slowly or fidgety/restless 0 0 0 0 1  Suicidal thoughts 0 0 0 0 0  PHQ-9 Score 4 1 2 4 6   Difficult doing work/chores   Not difficult at all          11/08/2023    9:11 AM 07/24/2023   11:08 AM 01/11/2023    9:48 AM 09/07/2022    9:06 AM  GAD 7 : Generalized Anxiety Score  Nervous, Anxious, on Edge 1 0 1 1  Control/stop worrying 0 0 0 1  Worry too much - different things 0 0 0 1  Trouble relaxing 1 0 0 0  Restless 0 0 0 0  Easily annoyed or irritable 1 1 1 1   Afraid - awful might happen 0 0 0 0  Total GAD 7 Score 3 1 2 4       Review of Systems:   Pertinent items are noted in HPI Denies abnormal vaginal discharge w/ itching/odor/irritation, headaches, visual changes, shortness of breath, chest pain, abdominal pain, severe nausea/vomiting, or problems with urination or bowel  movements unless otherwise stated above. Pertinent History Reviewed:  Reviewed past medical,surgical, social, obstetrical and family history.  Reviewed problem list, medications and allergies. Physical Assessment:   Vitals:   12/24/23 0914 12/24/23 1032  BP: 117/73   Pulse: (!) 130 (!) 108  Weight: 190 lb 6.4 oz (86.4 kg)   Body mass index is 32.68 kg/m.        Physical Examination:   General appearance: Well appearing, and in no distress  Mental status: Alert, oriented to person, place, and time  Skin: Warm & dry  Cardiovascular: Normal heart rate noted  Respiratory: Normal respiratory effort, no distress  Abdomen: Soft, gravid, nontender  Pelvic:  SSE cx visually closed and long, no pooling, no change w/ valsalva. Some clumpy white d/c, CV swab collected          Extremities: Edema: Trace  Fetal Status: Fetal Heart Rate (bpm): 160 Fundal Height: 34 cm Movement: Present    Chaperone: Winton Cherry   Results for orders placed or performed in visit on 12/24/23 (from the past  24 hours)  POCT hemoglobin   Collection Time: 12/24/23  9:30 AM  Result Value Ref Range   Hemoglobin 8.2 (A) 11 - 14.6 g/dL    Assessment & Plan:  1) Low-risk pregnancy G3P1102 at [redacted]w[redacted]d with an Estimated Date of Delivery: 01/25/24   2) Vaginal d/c w/ vulvar irritation, CV swab, can go ahead and try monistat 7 while waiting on results  3) H/O FGR> EFW next visit  4) Anemia w/ fatigue> fingerstick 8.2 today, vomits po Fe, will get CBC, if <9 recommend IV Venofer , discussed risks/benefits, is ok w/ this if needed   Meds:  Meds ordered this encounter  Medications   cyclobenzaprine  (FLEXERIL ) 10 MG tablet    Sig: Take 1 tablet (10 mg total) by mouth 3 (three) times daily as needed for muscle spasms.    Dispense:  30 tablet    Refill:  0   Labs/procedures today: spec exam, CV swab, and RSV vaccine, CBC  Plan:  Continue routine obstetrical care  Next visit: prefers will be in person for u/s and cultures      Reviewed: Preterm labor symptoms and general obstetric precautions including but not limited to vaginal bleeding, contractions, leaking of fluid and fetal movement were reviewed in detail with the patient.  All questions were answered. Does have home bp cuff. Office bp cuff given: not applicable. Check bp weekly, let us  know if consistently >140 and/or >90.  Follow-up: Return for As scheduled.  Future Appointments  Date Time Provider Department Center  01/01/2024  9:15 AM [facility] IM[facility] 2 [facility] None  01/01/2024 10:10 AM Loreli Suzen BIRCH, CNM [facility]  01/08/2024  8:30 AM Loreli Suzen BIRCH, CNM [facility]  01/15/2024  8:30 AM Loreli Suzen BIRCH, CNM [facility]  01/22/2024  8:30 AM Loreli Suzen BIRCH, CNM [facility]    Orders Placed This Encounter  Procedures   US  OB Follow Up   Respiratory syncytial virus vaccine, preF, subunit, bivalent,(Abrysvo)   CBC   POCT hemoglobin   [name] CNM, [facility] 12/24/2023 10:33 AM

## 2023-12-25 LAB — CBC
Hematocrit: 27.4 % — ABNORMAL LOW (ref 34.0–46.6)
Hemoglobin: 8.4 g/dL — ABNORMAL LOW (ref 11.1–15.9)
MCH: 22.6 pg — ABNORMAL LOW (ref 26.6–33.0)
MCHC: 30.7 g/dL — ABNORMAL LOW (ref 31.5–35.7)
MCV: 74 fL — ABNORMAL LOW (ref 79–97)
Platelets: 235 10*3/uL (ref 150–450)
RBC: 3.71 x10E6/uL — ABNORMAL LOW (ref 3.77–5.28)
RDW: 14.9 % (ref 11.7–15.4)
WBC: 10.7 10*3/uL (ref 3.4–10.8)

## 2023-12-25 NOTE — L&D Delivery Note (Signed)
OB/GYN Faculty Practice Delivery Note  Vickie Sutton is a 23 y.o. G3P1102 at [redacted]w[redacted]d admitted for SROM @ 0300.   GBS Status: Negative/-- (01/08 1345) Maximum Maternal Temperature: 98.2  Labor course: Initial SVE: 6 cm. Augmentation with: N/A. She then progressed to complete.  ROM: 5h 51m with clear fluid  Birth: At [phone] a viable female was delivered via spontaneous vaginal delivery (Presentation: LOA). Fetal head completely restituted to LOP on perineum.  Nuchal cord present: No.  Shoulders and body delivered in usual fashion. Infant placed directly on mom's abdomen for bonding/skin-to-skin, baby dried and stimulated. Cord clamped x 2 after 1 minute and cut by FOB.  Cord blood collected.  The placenta separated spontaneously and delivered via gentle cord traction.  Pitocin infused rapidly IV per protocol.  Fundus firm with massage.  Placenta inspected and appears to be intact with a 3 VC.  Placenta/Cord with the following complications: none.  Cord pH: n/a Sponge and instrument count were correct x2.  Intrapartum complications:  None Anesthesia:  epidural Episiotomy: none Lacerations:  hemodynamic skid marks at introitus Suture Repair:  n/a EBL (mL): 153   Infant: APGAR (1 MIN): 8  APGAR (5 MINS): 9  Infant weight: 7 lbs 1.9 oz (3230 gm)  Mom to postpartum.  Baby to Couplet care / Skin to Skin. Placenta to L&D   Plans to Bottlefeed Contraception: IUD Circumcision: N/A  Note sent to [facility]: FT for pp visit.  Raelyn Mora , MSN, CNM 01/18/2024 9:27 AM

## 2023-12-26 ENCOUNTER — Telehealth: Payer: Self-pay

## 2023-12-26 ENCOUNTER — Other Ambulatory Visit: Payer: Self-pay

## 2023-12-26 ENCOUNTER — Encounter: Payer: Self-pay | Admitting: Women's Health

## 2023-12-26 ENCOUNTER — Other Ambulatory Visit: Payer: Self-pay | Admitting: Women's Health

## 2023-12-26 DIAGNOSIS — O99013 Anemia complicating pregnancy, third trimester: Secondary | ICD-10-CM | POA: Insufficient documentation

## 2023-12-26 DIAGNOSIS — Z862 Personal history of diseases of the blood and blood-forming organs and certain disorders involving the immune mechanism: Secondary | ICD-10-CM | POA: Insufficient documentation

## 2023-12-26 LAB — CERVICOVAGINAL ANCILLARY ONLY
Bacterial Vaginitis (gardnerella): NEGATIVE
Candida Glabrata: NEGATIVE
Candida Vaginitis: POSITIVE — AB
Chlamydia: NEGATIVE
Comment: NEGATIVE
Comment: NEGATIVE
Comment: NEGATIVE
Comment: NEGATIVE
Comment: NEGATIVE
Comment: NORMAL
Neisseria Gonorrhea: NEGATIVE
Trichomonas: NEGATIVE

## 2023-12-26 NOTE — Addendum Note (Signed)
 Addended by: Ihor Austin D on: 12/26/2023 02:38 PM   Modules accepted: Orders

## 2023-12-26 NOTE — Telephone Encounter (Signed)
 Auth Submission: NO AUTH NEEDED Site of care: Site of care: [facility] Payer: [employer] Medication & CPT/J Code(s) submitted: Venofer  (Iron  Sucrose) J1756 Route of submission (phone, fax, portal):  Phone # Fax # Auth type: Buy/Bill PB Units/visits requested: 500mg  x 2 doses Reference number:  Approval from: 12/26/23 to 12/23/24

## 2023-12-26 NOTE — Telephone Encounter (Signed)
 Vickie Sutton, patient will be scheduled as soon as possible.  Auth Submission: NO AUTH NEEDED Site of care: Site of care: [facility] Payer: [employer] medicaid Medication & CPT/J Code(s) submitted: Venofer  (Iron  Sucrose) J1756 Route of submission (phone, fax, portal):  Phone # Fax # Auth type: Buy/Bill PB Units/visits requested: 200mg  x 5 doses Reference number:  Approval from: 12/26/23 to 12/23/24

## 2023-12-27 ENCOUNTER — Telehealth: Payer: Self-pay

## 2023-12-27 NOTE — Telephone Encounter (Signed)
 Auth Submission: NO AUTH NEEDED Site of care: Site of care: [facility] Payer: medicaid Medication & CPT/J Code(s) submitted: Venofer  (Iron  Sucrose) J1756 Route of submission (phone, fax, portal): phone Phone # Fax # Auth type: Buy/Bill HB Units/visits requested: 2 doses, 500mg  Reference number:  Approval from: 12/27/23 to 12/23/24

## 2023-12-31 ENCOUNTER — Encounter: Payer: Medicaid Other | Attending: Women's Health | Admitting: Internal Medicine

## 2023-12-31 VITALS — BP 103/67 | HR 70 | Temp 97.6°F | Resp 16

## 2023-12-31 DIAGNOSIS — O99013 Anemia complicating pregnancy, third trimester: Secondary | ICD-10-CM | POA: Diagnosis present

## 2023-12-31 DIAGNOSIS — Z3A00 Weeks of gestation of pregnancy not specified: Secondary | ICD-10-CM | POA: Diagnosis not present

## 2023-12-31 MED ORDER — IRON SUCROSE 500 MG IVPB - SIMPLE MED
500.0000 mg | Freq: Once | INTRAVENOUS | Status: AC
  Administered 2023-12-31: 500 mg via INTRAVENOUS
  Filled 2023-12-31: qty 275

## 2023-12-31 MED ORDER — DIPHENHYDRAMINE HCL 25 MG PO CAPS
25.0000 mg | ORAL_CAPSULE | Freq: Once | ORAL | Status: AC
  Administered 2023-12-31: 25 mg via ORAL

## 2023-12-31 MED ORDER — ACETAMINOPHEN 325 MG PO TABS
650.0000 mg | ORAL_TABLET | Freq: Once | ORAL | Status: AC
  Administered 2023-12-31: 650 mg via ORAL

## 2023-12-31 NOTE — Progress Notes (Signed)
 Diagnosis: Iron  Deficiency Anemia  Provider:   Luke Fetters, CNM  Procedure: Injection  IV Type: Peripheral, IV Location: R Antecubital  Venofer  (Iron  Sucrose), Dose: 500 mg  Infusion Start Time: 0928  Infusion Stop Time: 1531  Post Infusion IV Care: Observation period completed  Discharge: Condition: Good, Destination: Home . AVS Provided  Performed by:  Blanca Selinda SAUNDERS, LPN

## 2023-12-31 NOTE — Progress Notes (Signed)
 Patient stated that she was having cramping in her upper abdominal region.  This LPN contacted Sindy Guadeloupe let her know. Myna Hidalgo, MD wanted me to slow down her rate on her Venofer 500 mg, slowed IV rate down to 50 from 68.

## 2024-01-01 ENCOUNTER — Ambulatory Visit (INDEPENDENT_AMBULATORY_CARE_PROVIDER_SITE_OTHER): Payer: Medicaid Other | Admitting: Advanced Practice Midwife

## 2024-01-01 ENCOUNTER — Ambulatory Visit: Payer: Medicaid Other | Admitting: Radiology

## 2024-01-01 VITALS — BP 98/65 | HR 71 | Wt 190.4 lb

## 2024-01-01 DIAGNOSIS — Z3A36 36 weeks gestation of pregnancy: Secondary | ICD-10-CM | POA: Diagnosis not present

## 2024-01-01 DIAGNOSIS — Z3480 Encounter for supervision of other normal pregnancy, unspecified trimester: Secondary | ICD-10-CM

## 2024-01-01 DIAGNOSIS — Z3483 Encounter for supervision of other normal pregnancy, third trimester: Secondary | ICD-10-CM

## 2024-01-01 DIAGNOSIS — Z8759 Personal history of other complications of pregnancy, childbirth and the puerperium: Secondary | ICD-10-CM

## 2024-01-01 NOTE — Progress Notes (Signed)
 GA = 36+4 weeks  Single active female fetus,  cephalic,  FHR = 295 bpm  posterior pl, gr3 AFI = 20.9 cm,  86%,  MVP = 6.4 cm   EFW 41%, 2848g Limited view of cx, appears closed,   ov's not seen

## 2024-01-01 NOTE — Progress Notes (Signed)
   LOW-RISK PREGNANCY VISIT Patient name: Vickie Sutton MRN [id number]  Date of birth: 2001/10/13 Chief Complaint:   Routine Prenatal Visit  History of Present Illness:   Vickie Sutton is a 22 y.o. G30P1102 female at [redacted]w[redacted]d with an Estimated Date of Delivery: 01/25/24 being seen today for ongoing management of a low-risk pregnancy. Venofer  yesterday and next tues Today she reports  doing well; rec'd first dose of Venofer  yesterday and had some upper abd cramping/pressure . Contractions: Not present. Vag. Bleeding: None.  Movement: Present. denies leaking of fluid. Review of Systems:   Pertinent items are noted in HPI Denies abnormal vaginal discharge w/ itching/odor/irritation, headaches, visual changes, shortness of breath, chest pain, abdominal pain, severe nausea/vomiting, or problems with urination or bowel movements unless otherwise stated above. Pertinent History Reviewed:  Reviewed past medical,surgical, social, obstetrical and family history.  Reviewed problem list, medications and allergies. Physical Assessment:   Vitals:   01/01/24 1019  BP: 98/65  Pulse: 71  Weight: 190 lb 6.4 oz (86.4 kg)  Body mass index is 32.68 kg/m.        Physical Examination:   General appearance: Well appearing, and in no distress  Mental status: Alert, oriented to person, place, and time  Skin: Warm & dry  Cardiovascular: Normal heart rate noted  Respiratory: Normal respiratory effort, no distress  Abdomen: Soft, gravid, nontender  Pelvic: Cervical exam deferred         Extremities: tr edema   Fetal Status: Fetal Heart Rate (bpm): 140 u/s   Movement: Present    Growth u/s: GA = 36+4 weeks   Single active female fetus,  cephalic,  FHR = 859 bpm  posterior pl, gr3 AFI = 20.9 cm,  86%,  MVP = 6.4 cm   EFW 41%, 2848g Limited view of cx, appears closed,   ov's not seen  No results found for this or any previous visit (from the past 24 hours).  Assessment & Plan:  1) Low-risk pregnancy G3P1102 at  [redacted]w[redacted]d with an Estimated Date of Delivery: 01/25/24   2) Anemia, rec'd first dose of Venofer  yesterday and has 2nd on 01/07/24; will recheck Hgb on labor admission  3) Hx FGR, EFW today 41%  4) Hx Roux-en-Y   Meds: No orders of the defined types were placed in this encounter.  Labs/procedures today: growth u/s; GBS culture  Plan:  Continue routine obstetrical care   Reviewed: Term labor symptoms and general obstetric precautions including but not limited to vaginal bleeding, contractions, leaking of fluid and fetal movement were reviewed in detail with the patient.  All questions were answered. Has home bp cuff. Check bp weekly, let us  know if >140/90.   Follow-up: Return for As scheduled.  Orders Placed This Encounter  Procedures   Culture, beta strep (group b only)   Suzen JONETTA Gentry [facility] 01/01/2024 10:48 AM

## 2024-01-05 LAB — CULTURE, BETA STREP (GROUP B ONLY): Strep Gp B Culture: NEGATIVE

## 2024-01-07 ENCOUNTER — Encounter: Payer: Medicaid Other | Admitting: Internal Medicine

## 2024-01-07 VITALS — BP 111/74 | HR 92 | Temp 98.0°F | Resp 16

## 2024-01-07 DIAGNOSIS — O99013 Anemia complicating pregnancy, third trimester: Secondary | ICD-10-CM

## 2024-01-07 MED ORDER — ACETAMINOPHEN 325 MG PO TABS
650.0000 mg | ORAL_TABLET | Freq: Once | ORAL | Status: AC
Start: 2024-01-07 — End: 2024-01-07
  Administered 2024-01-07: 650 mg via ORAL

## 2024-01-07 MED ORDER — IRON SUCROSE 500 MG IVPB - SIMPLE MED
500.0000 mg | Freq: Once | INTRAVENOUS | Status: DC
Start: 2024-01-07 — End: 2024-01-07
  Filled 2024-01-07: qty 275

## 2024-01-07 MED ORDER — DIPHENHYDRAMINE HCL 25 MG PO CAPS
25.0000 mg | ORAL_CAPSULE | Freq: Once | ORAL | Status: AC
Start: 2024-01-07 — End: 2024-01-07
  Administered 2024-01-07: 25 mg via ORAL

## 2024-01-07 MED ORDER — SODIUM CHLORIDE 0.9 % IV SOLN
500.0000 mg | Freq: Once | INTRAVENOUS | Status: AC
  Administered 2024-01-07: 500 mg via INTRAVENOUS
  Filled 2024-01-07: qty 20

## 2024-01-07 NOTE — Progress Notes (Signed)
 Diagnosis: Iron  Deficiency Anemia  Provider:   Luke Fetters, CNM  Procedure: IV Infusion  IV Type: Peripheral, IV Location: R Antecubital  Venofer  (Iron  Sucrose), Dose: 500 mg  Infusion Start Time: 0924  Infusion Stop Time: 1434  Post Infusion IV Care: Observation period completed  Discharge: Condition: Good, Destination: Home . AVS Provided  Performed by:  Blanca Selinda SAUNDERS, LPN

## 2024-01-08 ENCOUNTER — Ambulatory Visit (INDEPENDENT_AMBULATORY_CARE_PROVIDER_SITE_OTHER): Payer: Medicaid Other | Admitting: Advanced Practice Midwife

## 2024-01-08 ENCOUNTER — Encounter: Payer: Self-pay | Admitting: Advanced Practice Midwife

## 2024-01-08 VITALS — BP 108/63 | HR 72 | Wt 194.5 lb

## 2024-01-08 DIAGNOSIS — D509 Iron deficiency anemia, unspecified: Secondary | ICD-10-CM

## 2024-01-08 DIAGNOSIS — Z3A37 37 weeks gestation of pregnancy: Secondary | ICD-10-CM

## 2024-01-08 DIAGNOSIS — O99013 Anemia complicating pregnancy, third trimester: Secondary | ICD-10-CM

## 2024-01-08 NOTE — Progress Notes (Signed)
   LOW-RISK PREGNANCY VISIT Patient name: Vickie Sutton MRN [id number]  Date of birth: 2001/10/20 Chief Complaint:   Routine Prenatal Visit  History of Present Illness:   Robena Mcginniss is a 23 y.o. G75P1102 female at 100w4d with an Estimated Date of Delivery: 01/25/24 being seen today for ongoing management of a low-risk pregnancy.  Today she reports no complaints. Contractions: Not present. Vag. Bleeding: None.  Movement: Present. denies leaking of fluid. Review of Systems:   Pertinent items are noted in HPI Denies abnormal vaginal discharge w/ itching/odor/irritation, headaches, visual changes, shortness of breath, chest pain, abdominal pain, severe nausea/vomiting, or problems with urination or bowel movements unless otherwise stated above. Pertinent History Reviewed:  Reviewed past medical,surgical, social, obstetrical and family history.  Reviewed problem list, medications and allergies. Physical Assessment:   Vitals:   01/08/24 0841  BP: 108/63  Pulse: 72  Weight: 194 lb 8 oz (88.2 kg)  Body mass index is 33.39 kg/m.        Physical Examination:   General appearance: Well appearing, and in no distress  Mental status: Alert, oriented to person, place, and time  Skin: Warm & dry  Cardiovascular: Normal heart rate noted  Respiratory: Normal respiratory effort, no distress  Abdomen: Soft, gravid, nontender  Pelvic: Cervical exam deferred         Extremities: Edema: None  Fetal Status: Fetal Heart Rate (bpm): 165 Fundal Height: 36 cm Movement: Present    No results found for this or any previous visit (from the past 24 hours).  Assessment & Plan:  1) Low-risk pregnancy G3P1102 at [redacted]w[redacted]d with an Estimated Date of Delivery: 01/25/24   Reviewed [employer]; may want membrane sweep if still preg at 39wks   Meds: No orders of the defined types were placed in this encounter.  Labs/procedures today: none  Plan:  Continue routine obstetrical care   Reviewed: Term labor symptoms and  general obstetric precautions including but not limited to vaginal bleeding, contractions, leaking of fluid and fetal movement were reviewed in detail with the patient.  All questions were answered. Has home bp cuff. Check bp weekly, let us  know if >140/90.   Follow-up: Return for As scheduled.  No orders of the defined types were placed in this encounter.  Jolayne Natter CNM 01/08/2024 9:05 AM

## 2024-01-08 NOTE — Patient Instructions (Addendum)
 Try the positions on [email or web address] to help with the baby's positioning!

## 2024-01-13 ENCOUNTER — Telehealth: Payer: Self-pay | Admitting: *Deleted

## 2024-01-13 NOTE — Telephone Encounter (Signed)
Pt has had a lot of pressure since last night. + baby movement, not as frequent. Can still count at least 10 movements over 2 hours. No spotting. Has a clear, mucous discharge. + Braxton Hicks contractions. Pt was advised that baby may have dropped, and that could be where the pressure is coming from. If notices decreased baby movement, leaking fluid or spotting, let us know; otherwise keep appt for 1/22. Pt voiced understanding. JSY

## 2024-01-15 ENCOUNTER — Ambulatory Visit (INDEPENDENT_AMBULATORY_CARE_PROVIDER_SITE_OTHER): Payer: Medicaid Other | Admitting: Advanced Practice Midwife

## 2024-01-15 ENCOUNTER — Encounter: Payer: Self-pay | Admitting: Advanced Practice Midwife

## 2024-01-15 VITALS — BP 100/69 | HR 81 | Wt 193.0 lb

## 2024-01-15 DIAGNOSIS — Z3483 Encounter for supervision of other normal pregnancy, third trimester: Secondary | ICD-10-CM

## 2024-01-15 DIAGNOSIS — Z3A38 38 weeks gestation of pregnancy: Secondary | ICD-10-CM

## 2024-01-15 DIAGNOSIS — Z3480 Encounter for supervision of other normal pregnancy, unspecified trimester: Secondary | ICD-10-CM

## 2024-01-15 NOTE — Progress Notes (Signed)
   LOW-RISK PREGNANCY VISIT Patient name: Vickie Sutton MRN [id number]  Date of birth: 04/14/2001 Chief Complaint:   Routine Prenatal Visit (Cervical check)  History of Present Illness:   Vickie Sutton is a 22 y.o. [id number] female at [redacted]w[redacted]d with an Estimated Date of Delivery: 01/25/24 being seen today for ongoing management of a low-risk pregnancy.  Today she reports  lots of irreg ctx/late preg symptoms; using inhaler more freq due to SOB . Contractions: Irritability.  .  Movement: Present. denies leaking of fluid. Review of Systems:   Pertinent items are noted in HPI Denies abnormal vaginal discharge w/ itching/odor/irritation, headaches, visual changes, shortness of breath, chest pain, abdominal pain, severe nausea/vomiting, or problems with urination or bowel movements unless otherwise stated above. Pertinent History Reviewed:  Reviewed past medical,surgical, social, obstetrical and family history.  Reviewed problem list, medications and allergies. Physical Assessment:   Vitals:   01/15/24 0828  BP: 100/69  Pulse: 81  Weight: 193 lb (87.5 kg)  Body mass index is 33.13 kg/m.        Physical Examination:   General appearance: Well appearing, and in no distress  Mental status: Alert, oriented to person, place, and time  Skin: Warm & dry  Cardiovascular: Normal heart rate noted  Respiratory: Normal respiratory effort, no distress  Abdomen: Soft, gravid, nontender  Pelvic: Cervical exam performed  Dilation: 1.5 Effacement (%): Thick Station: -3  Extremities: Edema: Trace  Fetal Status: Fetal Heart Rate (bpm): 151 Fundal Height: 38 cm Movement: Present Presentation: Vertex  No results found for this or any previous visit (from the past 24 hours).  Assessment & Plan:  1) Low-risk pregnancy G3P1102 at [redacted]w[redacted]d with an Estimated Date of Delivery: 01/25/24   2) Ready for labor, plan for membrane sweep @ next visit; will also sched 41.0wk IOL as well as an eIOL if she desires  3) Anemia, last  Hgb 8.4, then s/p Venofer x 2; will recheck in labor   Meds: No orders of the defined types were placed in this encounter.  Labs/procedures today: SVE  Plan:  Continue routine obstetrical care   Reviewed: Term labor symptoms and general obstetric precautions including but not limited to vaginal bleeding, contractions, leaking of fluid and fetal movement were reviewed in detail with the patient.  All questions were answered. Has home bp cuff. Check bp weekly, let us know if >140/90.   Follow-up: Return for As scheduled.  No orders of the defined types were placed in this encounter.  Arabella Merles CNM 01/15/2024 9:04 AM

## 2024-01-17 ENCOUNTER — Telehealth: Payer: Self-pay | Admitting: *Deleted

## 2024-01-17 NOTE — Telephone Encounter (Signed)
Pt reports some spotting this morning with mucus discharge. Baby is moving well. No pain or contractions. Advised patient that if she starts having bleeding like a period, her water breaks, baby isn't moving like normal or she is having regular contractions to go to the hospital. Patient has no other questions at this time.

## 2024-01-18 ENCOUNTER — Inpatient Hospital Stay (HOSPITAL_COMMUNITY): Payer: Medicaid Other | Admitting: Anesthesiology

## 2024-01-18 ENCOUNTER — Encounter (HOSPITAL_COMMUNITY): Payer: Self-pay | Admitting: Obstetrics & Gynecology

## 2024-01-18 ENCOUNTER — Other Ambulatory Visit: Payer: Self-pay

## 2024-01-18 ENCOUNTER — Inpatient Hospital Stay (HOSPITAL_COMMUNITY)
Admission: AD | Admit: 2024-01-18 | Discharge: 2024-01-19 | DRG: 806 | Disposition: A | Discharge status: Home / Self Care | Payer: Medicaid Other | Attending: Obstetrics and Gynecology | Admitting: Obstetrics and Gynecology

## 2024-01-18 DIAGNOSIS — O99844 Bariatric surgery status complicating childbirth: Secondary | ICD-10-CM | POA: Diagnosis present

## 2024-01-18 DIAGNOSIS — F339 Major depressive disorder, recurrent, unspecified: Secondary | ICD-10-CM | POA: Diagnosis present

## 2024-01-18 DIAGNOSIS — O99344 Other mental disorders complicating childbirth: Secondary | ICD-10-CM | POA: Diagnosis present

## 2024-01-18 DIAGNOSIS — Z8249 Family history of ischemic heart disease and other diseases of the circulatory system: Secondary | ICD-10-CM

## 2024-01-18 DIAGNOSIS — Z3A39 39 weeks gestation of pregnancy: Secondary | ICD-10-CM

## 2024-01-18 DIAGNOSIS — O9902 Anemia complicating childbirth: Principal | ICD-10-CM | POA: Diagnosis present

## 2024-01-18 DIAGNOSIS — Z3480 Encounter for supervision of other normal pregnancy, unspecified trimester: Principal | ICD-10-CM

## 2024-01-18 DIAGNOSIS — D509 Iron deficiency anemia, unspecified: Secondary | ICD-10-CM | POA: Diagnosis present

## 2024-01-18 DIAGNOSIS — O26893 Other specified pregnancy related conditions, third trimester: Secondary | ICD-10-CM | POA: Diagnosis present

## 2024-01-18 DIAGNOSIS — O4202 Full-term premature rupture of membranes, onset of labor within 24 hours of rupture: Secondary | ICD-10-CM | POA: Diagnosis not present

## 2024-01-18 LAB — CBC
HCT: 34.8 % — ABNORMAL LOW (ref 36.0–46.0)
Hemoglobin: 10.7 g/dL — ABNORMAL LOW (ref 12.0–15.0)
MCH: 24.7 pg — ABNORMAL LOW (ref 26.0–34.0)
MCHC: 30.7 g/dL (ref 30.0–36.0)
MCV: 80.2 fL (ref 80.0–100.0)
Platelets: 244 10*3/uL (ref 150–400)
RBC: 4.34 MIL/uL (ref 3.87–5.11)
WBC: 8.9 10*3/uL (ref 4.0–10.5)
nRBC: 0 % (ref 0.0–0.2)

## 2024-01-18 LAB — RPR: RPR Ser Ql: NONREACTIVE

## 2024-01-18 LAB — POCT FERN TEST: POCT Fern Test: POSITIVE

## 2024-01-18 LAB — TYPE AND SCREEN
ABO/RH(D): B POS
Antibody Screen: NEGATIVE

## 2024-01-18 MED ORDER — SOD CITRATE-CITRIC ACID 500-334 MG/5ML PO SOLN
30.0000 mL | ORAL | Status: DC | PRN
Start: 2024-01-18 — End: 2024-01-18

## 2024-01-18 MED ORDER — FENTANYL-BUPIVACAINE-NACL 0.5-0.125-0.9 MG/250ML-% EP SOLN: 12.0000 mL/h | EPIDURAL | Status: DC | PRN

## 2024-01-18 MED ORDER — PRENATAL MULTIVITAMIN CH
1.0000 | ORAL_TABLET | Freq: Every day | ORAL | Status: DC
  Administered 2024-01-18 – 2024-01-19 (×2): 1 via ORAL
  Filled 2024-01-18 (×2): qty 1

## 2024-01-18 MED ORDER — LACTATED RINGERS IV SOLN: 500.0000 mL | Freq: Once | INTRAVENOUS | Status: DC

## 2024-01-18 MED ORDER — EPHEDRINE 5 MG/ML INJ: 10.0000 mg | INTRAVENOUS | Status: DC | PRN

## 2024-01-18 MED ORDER — ZOLPIDEM TARTRATE 5 MG PO TABS: 5.0000 mg | ORAL_TABLET | Freq: Every evening | ORAL | Status: DC | PRN

## 2024-01-18 MED ORDER — PHENYLEPHRINE 80 MCG/ML (10ML) SYRINGE FOR IV PUSH (FOR BLOOD PRESSURE SUPPORT): 80.0000 ug | PREFILLED_SYRINGE | INTRAVENOUS | Status: DC | PRN

## 2024-01-18 MED ORDER — BENZOCAINE-MENTHOL 20-0.5 % EX AERO
1.0000 | INHALATION_SPRAY | CUTANEOUS | Status: DC | PRN
  Filled 2024-01-18: qty 56

## 2024-01-18 MED ORDER — OXYTOCIN-SODIUM CHLORIDE 30-0.9 UT/500ML-% IV SOLN
2.5000 [IU]/h | INTRAVENOUS | Status: DC
  Administered 2024-01-18 (×2): 2.5 [IU]/h via INTRAVENOUS

## 2024-01-18 MED ORDER — ONDANSETRON HCL 4 MG/2ML IJ SOLN: 4.0000 mg | INTRAMUSCULAR | Status: DC | PRN

## 2024-01-18 MED ORDER — TETANUS-DIPHTH-ACELL PERTUSSIS 5-2.5-18.5 LF-MCG/0.5 IM SUSY: 0.5000 mL | PREFILLED_SYRINGE | Freq: Once | INTRAMUSCULAR | Status: DC

## 2024-01-18 MED ORDER — ONDANSETRON HCL 4 MG/2ML IJ SOLN: 4.0000 mg | Freq: Four times a day (QID) | INTRAMUSCULAR | Status: DC | PRN

## 2024-01-18 MED ORDER — LACTATED RINGERS IV SOLN
500.0000 mL | Freq: Once | INTRAVENOUS | Status: AC
  Administered 2024-01-18: 500 mL via INTRAVENOUS

## 2024-01-18 MED ORDER — IBUPROFEN 600 MG PO TABS
600.0000 mg | ORAL_TABLET | Freq: Four times a day (QID) | ORAL | Status: DC
  Administered 2024-01-18 – 2024-01-19 (×5): 600 mg via ORAL
  Filled 2024-01-18 (×5): qty 1

## 2024-01-18 MED ORDER — FENTANYL-BUPIVACAINE-NACL 0.5-0.125-0.9 MG/250ML-% EP SOLN
12.0000 mL/h | EPIDURAL | Status: DC | PRN
  Administered 2024-01-18: 12 mL/h via EPIDURAL
  Filled 2024-01-18: qty 250

## 2024-01-18 MED ORDER — FENTANYL CITRATE (PF) 100 MCG/2ML IJ SOLN
50.0000 ug | INTRAMUSCULAR | Status: DC | PRN
Start: 2024-01-18 — End: 2024-01-18

## 2024-01-18 MED ORDER — DIPHENHYDRAMINE HCL 50 MG/ML IJ SOLN: 12.5000 mg | INTRAMUSCULAR | Status: DC | PRN

## 2024-01-18 MED ORDER — LACTATED RINGERS IV SOLN: INTRAVENOUS | Status: DC

## 2024-01-18 MED ORDER — COCONUT OIL OIL: 1.0000 | TOPICAL_OIL | Status: DC | PRN

## 2024-01-18 MED ORDER — ACETAMINOPHEN 325 MG PO TABS: 650.0000 mg | ORAL_TABLET | ORAL | Status: DC | PRN

## 2024-01-18 MED ORDER — OXYTOCIN BOLUS FROM INFUSION
333.0000 mL | Freq: Once | INTRAVENOUS | Status: AC
  Administered 2024-01-18: 333 mL via INTRAVENOUS

## 2024-01-18 MED ORDER — DIPHENHYDRAMINE HCL 25 MG PO CAPS: 25.0000 mg | ORAL_CAPSULE | Freq: Four times a day (QID) | ORAL | Status: DC | PRN

## 2024-01-18 MED ORDER — DIBUCAINE (PERIANAL) 1 % EX OINT: 1.0000 | TOPICAL_OINTMENT | CUTANEOUS | Status: DC | PRN

## 2024-01-18 MED ORDER — WITCH HAZEL-GLYCERIN EX PADS: 1.0000 | MEDICATED_PAD | CUTANEOUS | Status: DC | PRN

## 2024-01-18 MED ORDER — LIDOCAINE HCL (PF) 1 % IJ SOLN
INTRAMUSCULAR | Status: DC | PRN
  Administered 2024-01-18: 8 mL via EPIDURAL

## 2024-01-18 MED ORDER — LIDOCAINE HCL (PF) 1 % IJ SOLN: 30.0000 mL | INTRAMUSCULAR | Status: DC | PRN

## 2024-01-18 MED ORDER — LACTATED RINGERS IV SOLN: 500.0000 mL | INTRAVENOUS | Status: DC | PRN

## 2024-01-18 MED ORDER — SENNOSIDES-DOCUSATE SODIUM 8.6-50 MG PO TABS
2.0000 | ORAL_TABLET | Freq: Every day | ORAL | Status: DC
  Administered 2024-01-19: 2 via ORAL
  Filled 2024-01-18: qty 2

## 2024-01-18 MED ORDER — OXYCODONE-ACETAMINOPHEN 5-325 MG PO TABS
2.0000 | ORAL_TABLET | ORAL | Status: DC | PRN
Start: 2024-01-18 — End: 2024-01-18

## 2024-01-18 MED ORDER — ONDANSETRON HCL 4 MG PO TABS: 4.0000 mg | ORAL_TABLET | ORAL | Status: DC | PRN

## 2024-01-18 MED ORDER — SIMETHICONE 80 MG PO CHEW: 80.0000 mg | CHEWABLE_TABLET | ORAL | Status: DC | PRN

## 2024-01-18 MED ORDER — OXYCODONE-ACETAMINOPHEN 5-325 MG PO TABS
1.0000 | ORAL_TABLET | ORAL | Status: DC | PRN
Start: 2024-01-18 — End: 2024-01-18

## 2024-01-18 MED ORDER — ACETAMINOPHEN 325 MG PO TABS
650.0000 mg | ORAL_TABLET | ORAL | Status: DC | PRN
Start: 2024-01-18 — End: 2024-01-18

## 2024-01-18 MED ORDER — SERTRALINE HCL 50 MG PO TABS
50.0000 mg | ORAL_TABLET | Freq: Every day | ORAL | Status: DC
  Administered 2024-01-18 – 2024-01-19 (×2): 50 mg via ORAL
  Filled 2024-01-18 (×2): qty 1

## 2024-01-18 NOTE — H&P (Addendum)
Vickie Sutton is a 23 y.o. female [id number] at [redacted]w[redacted]d presenting for SOL.  NURSING  PROVIDER  Office Location [facility] Dating by LMP c/w U/S at 8 wks  [facility] Model Traditional Anatomy U/S Normal female 'Vickie Sutton'  Initiated care at  [employer]  English              LAB RESULTS   Support Person  Genetics NIPS: LR female AFP:     NT/IT (FT only) Neg/neg    Carrier Screen Horizon: neg SS screen (2021)  Rhogam  B/Positive/-- (07/31 1209) A1C/GTT Early:  Third trimester: wnl  Flu Vaccine 11/07/23    TDaP Vaccine  11/26/23  Blood Type B/Positive/-- (07/31 1209)  Covid Vaccine  Antibody Negative (07/31 1209)  RSV 12/24/23 Rubella 6.71 (07/31 1209)  Feeding Plan breast RPR Non Reactive (07/31 1209)  Contraception Mirena at pp visit HBsAg Negative (07/31 1209)  Circumcision N/a HIV Non Reactive (07/31 1209)  Pediatrician  [facility] HCVAb Non Reactive (07/31 1209)  Prenatal Classes discussed      Pap 06/16/21 NILM  BTLConsent  GC/CT Initial:  -/- 36wks:  -/-  VBAC  Consent  GBS negative       DME Rx [x]  BP cuff [ ]  Weight Scale Waterbirth  [ ]  Class [ ]  Consent [ ]  CNM visit  PHQ9 & GAD7 [  ] new OB [  ] 28 weeks  [  ] 36 weeks Induction  [ ]  Orders Entered [ ] Foley Y/N    OB History     Gravida  3   Para  2   Term  1   Preterm  1   AB      Living  2      SAB      IAB      Ectopic      Multiple  0   Live Births  2          Past Medical History:  Diagnosis Date   ADD (attention deficit disorder)    Anxiety    Asthma    Depression    Migraine    Panic attacks    Polycystic ovarian syndrome    POTS (postural orthostatic tachycardia syndrome)    Syncope    Neurocardiogenic suspected   Vasovagal syncope    Past Surgical History:  Procedure Laterality Date   ESOPHAGOGASTRODUODENOSCOPY     LAPAROTOMY N/A 09/07/2020   Procedure: EXPLORATORY LAPAROTOMY WITH EXTRACTION  OVARIAN CYST;  Surgeon: Lazaro Arms, MD;  Location: [facility] OR;  Service:  Gynecology;  Laterality: N/A;   OVARIAN CYST SURGERY Right    ROUX-EN-Y GASTRIC BYPASS     August 2020 - [facility]   TOOTH EXTRACTION N/A 12/12/2015   Procedure: EXTRACTION MOLARS - teeth one, sixteen, seventeen and thirty-two;  Surgeon: Ocie Doyne, DDS;  Location: [facility] OR;  Service: Oral Surgery;  Laterality: N/A;   Family History: family history includes Asthma in her son; Diabetes Mellitus II in her maternal grandmother; Heart disease in her maternal grandmother; Obesity in her mother; Pneumonia in her son; Seizures in her maternal aunt; Evelene Croon Parkinson White syndrome in her brother. Social History:  reports that she has never smoked. She has never used smokeless tobacco. She reports that she does not drink alcohol and does not use drugs.     Maternal Diabetes: No Genetic Screening: Normal Maternal Ultrasounds/Referrals: Normal Fetal Ultrasounds or other  Referrals:  None Maternal Substance Abuse:  No Significant Maternal Medications:  None Significant Maternal Lab Results:  Group B Strep negative Number of Prenatal Visits:greater than 3 verified prenatal visits Maternal Vaccinations:RSV: Given during pregnancy >/=14 days ago, TDap, and Flu Other Comments:  None  History Dilation: 6 Effacement (%): 70 Station: -2 Exam by:: Joline Salt, RN Blood pressure 104/68, pulse 77, temperature 98 F (36.7 C), temperature source Oral, resp. rate 15, height 5\' 4"  (1.626 m), weight 87 kg, last menstrual period 04/20/2023, SpO2 100%, not currently breastfeeding. Exam Physical Exam Constitutional:      Appearance: Normal appearance.  HENT:     Head: Normocephalic and atraumatic.  Pulmonary:     Effort: No respiratory distress.  Abdominal:     Comments: Gravid  Musculoskeletal:     Right lower leg: No edema.     Left lower leg: No edema.  Neurological:     General: No focal deficit present.     Mental Status: She is alert and oriented to person, place, and time.     Prenatal  labs: ABO, Rh: --/--/B POS (01/25 0557) Antibody: NEG (01/25 0557) Rubella: 6.71 (07/31 1209) RPR: Non Reactive (11/15 0839)  HBsAg: Negative (07/31 1209)  HIV: Non Reactive (11/15 0839)  GBS: Negative/-- (01/08 1345)   Assessment/Plan: Vickie Sutton is a 23 y.o. [id number] at [redacted]w[redacted]d here for SOL.   #Labor: Continue expectant management #Pain: epidural #FWB: Cat 1 #GBS status:   negative #Feeding: bottle feeding #Reproductive Life planning: OP IUD #Circ: NA   Mary Sella [facility] 01/18/2024 7:52 AM   Midwife attestation: I have seen and examined this patient; I agree with above documentation in the resident's note.   Vickie Sutton is a 23 y.o. [phone] here for SOL  PE: BP (!) 100/53   Pulse 73   Temp 98 F (36.7 C) (Oral)   Resp 17   Ht 5\' 4"  (1.626 m)   Wt 87 kg   LMP 04/20/2023   SpO2 100%   BMI 32.92 kg/m  Gen: calm comfortable, NAD Resp: normal effort, no distress Abd: gravid  ROS, labs, PMH reviewed  Plan: Admit to LD Labor: expectant management Fetal monitoring: Category I ID: GBS negative  Sharen Counter, CNM  01/18/2024, 9:08 AM

## 2024-01-18 NOTE — Anesthesia Preprocedure Evaluation (Signed)
Anesthesia Evaluation  Patient identified by MRN, date of birth, ID band Patient awake    Reviewed: Allergy & Precautions, H&P , NPO status , Patient's Chart, lab work & pertinent test results, reviewed documented beta blocker date and time   Airway Mallampati: I  TM Distance: >3 FB Neck ROM: full    Dental no notable dental hx. (+) Teeth Intact, Dental Advisory Given   Pulmonary neg pulmonary ROS   Pulmonary exam normal breath sounds clear to auscultation       Cardiovascular negative cardio ROS Normal cardiovascular exam Rhythm:regular Rate:Normal     Neuro/Psych negative neurological ROS  negative psych ROS   GI/Hepatic negative GI ROS, Neg liver ROS,,,  Endo/Other  negative endocrine ROS    Renal/GU negative Renal ROS  negative genitourinary   Musculoskeletal   Abdominal   Peds  Hematology negative hematology ROS (+)   Anesthesia Other Findings POTS  Reproductive/Obstetrics (+) Pregnancy                             Anesthesia Physical Anesthesia Plan  ASA: 2  Anesthesia Plan: Epidural   Post-op Pain Management: Minimal or no pain anticipated   Induction: Intravenous  PONV Risk Score and Plan: 2  Airway Management Planned: Natural Airway and Simple Face Mask  Additional Equipment: None  Intra-op Plan:   Post-operative Plan:   Informed Consent: I have reviewed the patients History and Physical, chart, labs and discussed the procedure including the risks, benefits and alternatives for the proposed anesthesia with the patient or authorized representative who has indicated his/her understanding and acceptance.       Plan Discussed with: Anesthesiologist  Anesthesia Plan Comments:        Anesthesia Quick Evaluation

## 2024-01-18 NOTE — Discharge Summary (Signed)
Postpartum Discharge Summary  Date of Service updated***     Patient Name: Vickie Sutton DOB: Jul 12, 2001 MRN: [id number]  Date of admission: 01/18/2024 Delivery date:01/18/2024 Delivering provider: Raelyn Mora Date of discharge: 01/18/2024  Admitting diagnosis: Normal labor [O80, Z37.9] Intrauterine pregnancy: [redacted]w[redacted]d     Secondary diagnosis:  Active Problems:   Postpartum care following vaginal delivery   Normal spontaneous vaginal delivery  Additional problems: ***    Discharge diagnosis: Term Pregnancy Delivered                                              Post partum procedures:{Postpartum procedures:23558} Augmentation: N/A Complications: None  Hospital course: Onset of Labor With Vaginal Delivery      23 y.o. yo G2X5284 at [redacted]w[redacted]d was admitted in Active Labor on 01/18/2024. Labor course was complicated by none.  Membrane Rupture Time/Date: 3:00 AM,01/18/2024  Delivery Method:Vaginal, Spontaneous Operative Delivery:N/A Episiotomy: None Lacerations:  None Patient had a postpartum course complicated by ***.  She is ambulating, tolerating a regular diet, passing flatus, and urinating well. Patient is discharged home in stable condition on 01/18/24.  Newborn Data: Birth date:01/18/2024 Birth time:8:56 AM Gender:Female Living status:Living Apgars:8 ,9  Weight:3230 g (7 lbs 1.9 oz)  Magnesium Sulfate received: No BMZ received: No Rhophylac:N/A MMR:N/A T-DaP:Given prenatally - 11/26/2023 Flu: Yes - 11/07/2023 RSV Vaccine received: Yes - 12/24/2023 Transfusion:{Transfusion received:30440034}  Immunizations received: Immunization History  Administered Date(s) Administered   Influenza, Seasonal, Injecte, Preservative Fre 11/08/2023   Influenza,inj,Quad PF,6+ Mos 08/29/2018, 10/01/2022   Influenza-Unspecified 09/14/2019   Rsv, Bivalent, Protein Subunit Rsvpref,pf Verdis Frederickson) 12/24/2023   Tdap 11/08/2020, 10/01/2022, 11/26/2023    Physical exam  Vitals:   01/18/24  0740 01/18/24 0803 01/18/24 0831 01/18/24 0917  BP: 104/68 98/66 (!) 100/53 (!) 90/45  Pulse: 77 60 73 (!) 111  Resp:  17  16  Temp:      TempSrc:      SpO2: 100%     Weight:      Height:       General: {Exam; general:21111117} Lochia: {Desc; appropriate/inappropriate:30686::"appropriate"} Uterine Fundus: {Desc; firm/soft:30687} Incision: {Exam; incision:21111123} DVT Evaluation: {Exam; XLK:4401027} Labs: Lab Results  Component Value Date   WBC 8.9 01/18/2024   HGB 10.7 (L) 01/18/2024   HCT 34.8 (L) 01/18/2024   MCV 80.2 01/18/2024   PLT 244 01/18/2024      Latest Ref Rng & Units 07/11/2022   12:17 PM  CMP  Glucose 70 - 99 mg/dL 71   BUN 6 - 20 mg/dL 11   Creatinine 2.53 - 1.00 mg/dL 6.64   Sodium 403 - 474 mmol/L 134   Potassium 3.5 - 5.1 mmol/L 3.5   Chloride 98 - 111 mmol/L 106   CO2 22 - 32 mmol/L 18   Calcium 8.9 - 10.3 mg/dL 9.1    Edinburgh Score:    12/21/2022   11:23 AM  Edinburgh Postnatal Depression Scale Screening Tool  I have been able to laugh and see the funny side of things. 1  I have looked forward with enjoyment to things. 1  I have blamed myself unnecessarily when things went wrong. 2  I have been anxious or worried for no good reason. 2  I have felt scared or panicky for no good reason. 1  Things have been getting on top of me. 2  I have been  so unhappy that I have had difficulty sleeping. 1  I have felt sad or miserable. 1  I have been so unhappy that I have been crying. 1  The thought of harming myself has occurred to me. 0  Edinburgh Postnatal Depression Scale Total 12   No data recorded  After visit meds:  Allergies as of 01/18/2024   No Known Allergies   Med Rec must be completed prior to using this [facility]***        Discharge home in stable condition Infant Feeding: {Baby feeding:23562} Infant Disposition:{CHL IP OB HOME WITH ZOXWRU:04540} Discharge instruction: per After Visit Summary and Postpartum booklet. Activity:  Advance as tolerated. Pelvic rest for 6 weeks.  Diet: {OB JWJX:91478295} Future Appointments: Future Appointments  Date Time Provider Department Center  01/22/2024  8:30 AM Arabella Merles, CNM [facility]   Follow up Visit:  Follow-up Information     [facility] ([place]). Schedule an appointment as soon as possible for a visit in 6 week(s).   Specialty: Obstetrics and Gynecology Why: postpartum visit and IUD insertion Contact information: [address] [place] [address] [phone]                Message sent to [facility] Admin Pool by R. Arita Miss, CNM on 01/18/2024: Please schedule this patient for a In person postpartum visit in 6 weeks with the following provider: Any provider. Additional Postpartum F/U: Postpartum Depression checkup in 2 weeks (history of anxiety/depression/panic attacks) Low risk pregnancy complicated by:  none Delivery mode:  Vaginal, Spontaneous Anticipated Birth Control:  IUD at [facility] visit   01/18/2024 Raelyn Mora, CNM

## 2024-01-18 NOTE — MAU Note (Signed)
..  Vickie Sutton is a 23 y.o. at [redacted]w[redacted]d here in MAU reporting: leaking of fluid since 3 am. Decreased fetal movement, has not felt baby move since dinnertime. Vaginal pressure and irregular contractions.     Pain score: 4/10 Vitals:   01/18/24 0523  BP: 116/68  Pulse: 71  Resp: 17  Temp: 98.2 F (36.8 C)  SpO2: 100%     FHT:140 Lab orders placed from triage: fern

## 2024-01-18 NOTE — Anesthesia Postprocedure Evaluation (Signed)
Anesthesia Post Note  Patient: Vickie Sutton  Procedure(s) Performed: AN AD HOC LABOR EPIDURAL     Patient location during evaluation: Mother Baby Anesthesia Type: Epidural Level of consciousness: awake and alert Pain management: pain level controlled Vital Signs Assessment: post-procedure vital signs reviewed and stable Respiratory status: spontaneous breathing, nonlabored ventilation and respiratory function stable Cardiovascular status: stable Postop Assessment: no headache, no backache and epidural receding Anesthetic complications: no   No notable events documented.  Last Vitals:  Vitals:   01/18/24 1155 01/18/24 1558  BP: 108/65 110/73  Pulse: 67 60  Resp: 16 16  Temp: (!) 36.4 C 36.5 C  SpO2: 100% 100%    Last Pain:  Vitals:   01/18/24 1558  TempSrc: Axillary  PainSc: 0-No pain   Pain Goal: Patients Stated Pain Goal: 0 (01/18/24 0525)                 Barrett Holthaus

## 2024-01-18 NOTE — Research (Signed)
Chart was open for review due to be assigned to this patient by charge nurse K. Lassister  to the [facility] on 01/18/2024 in room 411.

## 2024-01-18 NOTE — Anesthesia Procedure Notes (Signed)
Epidural Patient location during procedure: OB Start time: 01/18/2024 7:04 AM End time: 01/18/2024 7:10 AM  Staffing Anesthesiologist: Bethena Midget, MD  Preanesthetic Checklist Completed: patient identified, IV checked, site marked, risks and benefits discussed, surgical consent, monitors and equipment checked, pre-op evaluation and timeout performed  Epidural Patient position: sitting Prep: DuraPrep and site prepped and draped Patient monitoring: continuous pulse ox and blood pressure Approach: midline Location: L3-L4 Injection technique: LOR air  Needle:  Needle type: Tuohy  Needle gauge: 17 G Needle length: 9 cm and 9 Needle insertion depth: 6 cm Catheter type: closed end flexible Catheter size: 19 Gauge Catheter at skin depth: 12 cm Test dose: negative  Assessment Events: blood not aspirated, no cerebrospinal fluid, injection not painful, no injection resistance, no paresthesia and negative IV test

## 2024-01-19 LAB — CBC
HCT: 29.4 % — ABNORMAL LOW (ref 36.0–46.0)
Hemoglobin: 9 g/dL — ABNORMAL LOW (ref 12.0–15.0)
MCH: 24.8 pg — ABNORMAL LOW (ref 26.0–34.0)
MCHC: 30.6 g/dL (ref 30.0–36.0)
MCV: 81 fL (ref 80.0–100.0)
Platelets: 205 10*3/uL (ref 150–400)
RBC: 3.63 MIL/uL — ABNORMAL LOW (ref 3.87–5.11)
WBC: 11 10*3/uL — ABNORMAL HIGH (ref 4.0–10.5)
nRBC: 0 % (ref 0.0–0.2)

## 2024-01-19 MED ORDER — WITCH HAZEL-GLYCERIN EX PADS: 1.0000 | MEDICATED_PAD | CUTANEOUS | 12 refills | Status: AC | PRN

## 2024-01-19 MED ORDER — COCONUT OIL OIL: 1.0000 | TOPICAL_OIL | Status: DC | PRN

## 2024-01-19 MED ORDER — ACETAMINOPHEN 325 MG PO TABS: 650.0000 mg | ORAL_TABLET | ORAL | Status: AC | PRN

## 2024-01-19 MED ORDER — FERROUS SULFATE 325 (65 FE) MG PO TABS
325.0000 mg | ORAL_TABLET | ORAL | Status: DC
  Administered 2024-01-19: 325 mg via ORAL
  Filled 2024-01-19: qty 1

## 2024-01-19 MED ORDER — IBUPROFEN 600 MG PO TABS: 600.0000 mg | ORAL_TABLET | Freq: Four times a day (QID) | ORAL | 0 refills | Status: DC

## 2024-01-19 MED ORDER — DIBUCAINE (PERIANAL) 1 % EX OINT: 1.0000 | TOPICAL_OINTMENT | CUTANEOUS | Status: DC | PRN

## 2024-01-19 NOTE — Progress Notes (Addendum)
CSW received consult for hx of Anxiety and Depression. CSW met with MOB to offer support and complete assessment. When CSW entered room, MOB was observed sitting in hospital bed. Infant was asleep on her back in bassinet. Visitors were present in room. CSW introduced self and requested to speak with MOB alone. Visitors left room. CSW explained reason for consult. MOB presented as calm, welcomed CSW visit, and remained engaged during consult.   CSW inquired how MOB is feeling emotionally since infant's arrival. MOB states that she feels like her "hormones are everywhere" and felt her legs shaking last night, wondering if this was due to anxiety. MOB shared that she asked her RN for her depression medication because she notices that she begins feeling different if she misses a dose. MOB did not appear to be in distress during consult and presented with a pleasant, talkative demeanor.   CSW inquired about current treatment. MOB reports she takes sertraline 50mg  daily which she reports as helpful. CSW inquired about MOB's mental health history. MOB reported that she was first diagnosed with depression and anxiety in 2020 when she began experiencing symptoms of postpartum depression after the birth of her first son. Of note, per chart review, MOB reports a history of depression dating back to middle school. MOB reports she was prescribed Lexapro from 2020-2023. MOB noted that she felt that Lexapro made her feel "antsy" so was she switched to sertraline, which she feels works well to manage symptoms of anxiety and depression. MOB described symptoms of postpartum depression in 2020 as not letting others help her, feeling overwhelmed, and endorsing feelings of resentment and anger towards her first son's FOB due to having limited support at the time. MOB reports that she had mild symptoms of postpartum depression after the birth of her second child, describing her symptoms as "not as bad."   CSW inquired about mental  health symptoms during pregnancy. MOB denied symptoms of depression and reported that she would have panic attacks on occasion due to having a diagnosis of POTS (postural orthostatic tachycardia syndrome), which causes feelings of anxiety. MOB reports that she copes with anxious feelings through slowing down physically, engaging in self talk, and calling a support. MOB reports she does not currently see a therapist but has met with IBH therpist Hulda Marin through her OBGYN clinic in the past. MOB declined additional outpatient mental health resources at this time, stating she feels her symptoms are well managed from her medication and coping skills. MOB stated that she feels comfortable contacting her OBGYN or PCP if she feels she is in need of additional support. MOB identified her mom, sister, mother-in-law, and FOB/Fiance as supports. MOB denied current SI/HI/DV.  CSW provided education regarding the baby blues period vs. perinatal mood disorders, discussed treatment and gave resources for mental health follow up if concerns arise.  CSW recommends self-evaluation during the postpartum time period using the New Mom Checklist from Postpartum Progress and encouraged MOB to contact a medical professional if symptoms are noted at any time.    MOB reports she has all needed items for infant, including a car seat and bassinet. MOB has chosen [facility] for infant's follow up care.  CSW provided review of Sudden Infant Death Syndrome (SIDS) precautions.    CSW identifies no further need for intervention and no barriers to discharge at this time.  Signed,  Norberto Sorenson, MSW, LCSWA, LCASA 01/19/2024 12:50 PM

## 2024-01-22 ENCOUNTER — Encounter: Payer: Medicaid Other | Admitting: Advanced Practice Midwife

## 2024-01-27 ENCOUNTER — Encounter: Payer: Self-pay | Admitting: Women's Health

## 2024-01-27 ENCOUNTER — Ambulatory Visit: Payer: Medicaid Other | Admitting: Women's Health

## 2024-01-27 VITALS — BP 99/58 | HR 78 | Ht 65.0 in | Wt 166.0 lb

## 2024-01-27 DIAGNOSIS — Z3009 Encounter for other general counseling and advice on contraception: Secondary | ICD-10-CM | POA: Diagnosis not present

## 2024-01-27 DIAGNOSIS — F418 Other specified anxiety disorders: Secondary | ICD-10-CM

## 2024-01-27 NOTE — Progress Notes (Signed)
GYN VISIT Patient name: Vickie Sutton MRN [id number]  Date of birth: [birth year] Chief Complaint:   Postpartum Care (1 week postpartum)  History of Present Illness:   Vickie Sutton is a 23 y.o. [phone] female 9d s/p SVBbeing seen today for mood check. H/O dep/anx/panic attacks on zoloft 50mg , had appt w/ [facility]BH during pregnancy, but stopped b/c was doing well. EPDS today 7. Feels like she's doing well, denies SI/HI/II, some anxiety about being mom again, but coping well. Bottlfeeding. Plans IUD at ppv.  Patient's last menstrual period was 04/20/2023.     01/27/2024    1:31 PM 01/07/2024    8:36 AM 12/31/2023    8:37 AM 11/08/2023    9:10 AM 07/24/2023   11:08 AM  Depression screen PHQ 2/9  Decreased Interest 0 0 0 0 0  Down, Depressed, Hopeless 0 0 0 0 0  PHQ - 2 Score 0 0 0 0 0  Altered sleeping 0   1 0  Tired, decreased energy 2   3 1   Change in appetite 0   0 0  Feeling bad or failure about yourself  0   0 0  Trouble concentrating 0   0 0  Moving slowly or fidgety/restless 1   0 0  Suicidal thoughts 0   0 0  PHQ-9 Score 3   4 1         11/08/2023    9:11 AM 07/24/2023   11:08 AM 01/11/2023    9:48 AM 09/07/2022    9:06 AM  GAD 7 : Generalized Anxiety Score  Nervous, Anxious, on Edge 1 0 1 1  Control/stop worrying 0 0 0 1  Worry too much - different things 0 0 0 1  Trouble relaxing 1 0 0 0  Restless 0 0 0 0  Easily annoyed or irritable 1 1 1 1   Afraid - awful might happen 0 0 0 0  Total GAD 7 Score 3 1 2 4      Review of Systems:   Pertinent items are noted in HPI Denies fever/chills, dizziness, headaches, visual disturbances, fatigue, shortness of breath, chest pain, abdominal pain, vomiting, abnormal vaginal discharge/itching/odor/irritation, problems with periods, bowel movements, urination, or intercourse unless otherwise stated above.  Pertinent History Reviewed:  Reviewed past medical,surgical, social, obstetrical and family history.  Reviewed problem list,  medications and allergies. Physical Assessment:   Vitals:   01/27/24 1327  BP: (!) 99/58  Pulse: 78  Weight: 166 lb (75.3 kg)  Height: 5\' 5"  (1.651 m)  Body mass index is 27.62 kg/m.       Physical Examination:   General appearance: alert, well appearing, and in no distress  Mental status: alert, oriented to person, place, and time  Skin: warm & dry   Cardiovascular: normal heart rate noted  Respiratory: normal respiratory effort, no distress  Abdomen: soft, non-tender   Pelvic: examination not indicated  Extremities: no edema   Chaperone: N/A    No results found for this or any previous visit (from the past 24 hours).  Assessment & Plan:  1) 1wk s/p SVB> bottlefeeding  2) Dep/anx/panic attacks> doing well on zoloft 50mg , declines IBH right now, will call them if decides she needs it  3) Contraception counseling> abstinence until IUD insertion at pp visit  Meds: No orders of the defined types were placed in this encounter.   No orders of the defined types were placed in this encounter.   Return for As scheduled.  Cala Bradford  Mady Gemma CNM, WHNP-BC 01/27/2024 1:58 PM

## 2024-02-19 ENCOUNTER — Encounter: Payer: Self-pay | Admitting: Advanced Practice Midwife

## 2024-02-19 ENCOUNTER — Ambulatory Visit: Payer: Medicaid Other | Admitting: Advanced Practice Midwife

## 2024-02-19 DIAGNOSIS — Z3202 Encounter for pregnancy test, result negative: Secondary | ICD-10-CM | POA: Diagnosis not present

## 2024-02-19 DIAGNOSIS — N3946 Mixed incontinence: Secondary | ICD-10-CM | POA: Diagnosis not present

## 2024-02-19 DIAGNOSIS — Z30430 Encounter for insertion of intrauterine contraceptive device: Secondary | ICD-10-CM

## 2024-02-19 DIAGNOSIS — Z862 Personal history of diseases of the blood and blood-forming organs and certain disorders involving the immune mechanism: Secondary | ICD-10-CM

## 2024-02-19 LAB — POCT URINE PREGNANCY: Preg Test, Ur: NEGATIVE

## 2024-02-19 LAB — POCT HEMOGLOBIN: Hemoglobin: 12.9 g/dL (ref 11–14.6)

## 2024-02-19 MED ORDER — LEVONORGESTREL 20 MCG/DAY IU IUD
1.0000 | INTRAUTERINE_SYSTEM | Freq: Once | INTRAUTERINE | Status: AC
  Administered 2024-02-19: 1 via INTRAUTERINE

## 2024-02-19 NOTE — Progress Notes (Signed)
 POSTPARTUM VISIT Patient name: Vickie Sutton MRN [id number]  Date of birth: July 07, 2001 Chief Complaint:   Postpartum Care  History of Present Illness:   Vickie Sutton is a 23 y.o. [phone]  female being seen today for a postpartum visit. She is  4.5  weeks postpartum following a spontaneous vaginal delivery at 39.0 gestational weeks. IOL: no. Anesthesia: epidural.  Laceration: none.  Complications: none. Inpatient contraception: no.   Pregnancy complicated by anemia; hx roux-en-Y; dep/anx . Tobacco use: no. Substance use disorder: no. Last pap smear: June 2022 and results were NILM w/ HRHPV not done. Next pap smear due: June 2025 Patient's last menstrual period was 04/20/2023.  Postpartum course has been uncomplicated. Bleeding scant staining. Bowel function is normal. Bladder function is normal. Urinary incontinence? yes wearing panty liner for dribbling , fecal incontinence? no Patient is not sexually active. Last sexual activity: prior to birth of baby. Desired contraception: IUD. Patient  does not know about  a pregnancy in the future.  Desired family size is unsure number of children.   The pregnancy intention screening data noted above was reviewed. Potential methods of contraception were discussed. The patient elected to proceed with No data recorded.  Edinburgh Postpartum Depression Screening: positive: H/O mental health disorder: yes dep/anx. Currently on meds: yes Zoloft 50mg .  Currently in therapy: no.  Sleeping: as expected.  Appetite: nl.  Still finds joy in things she used to: Yes.  Support at home: lots of family.  SI/HI/II: no.  Interested in medicine: Yes.  Interested in therapy: No.  Edinburgh Postnatal Depression Scale - 02/19/24 1037       Edinburgh Postnatal Depression Scale:  In the Past 7 Days   I have been able to laugh and see the funny side of things. 0    I have looked forward with enjoyment to things. 0    I have blamed myself unnecessarily when things  went wrong. 1    I have been anxious or worried for no good reason. 1    I have felt scared or panicky for no good reason. 1    Things have been getting on top of me. 1    I have been so unhappy that I have had difficulty sleeping. 0    I have felt sad or miserable. 1    I have been so unhappy that I have been crying. 1    The thought of harming myself has occurred to me. 0    Edinburgh Postnatal Depression Scale Total 6                11/08/2023    9:11 AM 07/24/2023   11:08 AM 01/11/2023    9:48 AM 09/07/2022    9:06 AM  GAD 7 : Generalized Anxiety Score  Nervous, Anxious, on Edge 1 0 1 1  Control/stop worrying 0 0 0 1  Worry too much - different things 0 0 0 1  Trouble relaxing 1 0 0 0  Restless 0 0 0 0  Easily annoyed or irritable 1 1 1 1   Afraid - awful might happen 0 0 0 0  Total GAD 7 Score 3 1 2 4      Baby's course has been uncomplicated. Baby is feeding by bottle. Infant has a pediatrician/family doctor? Yes.  Childcare strategy if returning to work/school: n/a-stay at home mom.  Pt has material needs met for her and baby: Yes.   Review of Systems:   Pertinent items are  noted in HPI Denies Abnormal vaginal discharge w/ itching/odor/irritation, headaches, visual changes, shortness of breath, chest pain, abdominal pain, severe nausea/vomiting, or problems with urination or bowel movements. Pertinent History Reviewed:  Reviewed past medical,surgical, obstetrical and family history.  Reviewed problem list, medications and allergies. OB History  Gravida Para Term Preterm AB Living  3 3 2 1  3   SAB IAB Ectopic Multiple Live Births     0 3    # Outcome Date GA Lbr Len/2nd Weight Sex Type Anes PTL Lv  3 Term 01/18/24 103w0d 05:43 / 00:13 7 lb 1.9 oz (3.23 kg) F Vag-Spont EPI  LIV  2 Preterm 11/10/22 [redacted]w[redacted]d 08:18 / 01:40 5 lb 7.8 oz (2.49 kg) M Vag-Spont EPI  LIV  1 Term 02/10/21 [redacted]w[redacted]d 00:40 / 04:23 6 lb 10 oz (3.005 kg) M Vag-Spont EPI N LIV   Physical Assessment:    Vitals:   02/19/24 1040  BP: 106/72  Pulse: 71  Weight: 167 lb (75.8 kg)  Height: 5\' 5"  (1.651 m)  Body mass index is 27.79 kg/m.       Physical Examination:   General appearance: alert, well appearing, and in no distress  Mental status: alert, oriented to person, place, and time  Skin: warm & dry   Cardiovascular: normal heart rate noted   Respiratory: normal respiratory effort, no distress   Breasts: deferred, no complaints   Abdomen: soft, non-tender   Pelvic: normal external genitalia, vulva, vagina, cervix, uterus and adnexa. Thin prep pap obtained: No  Rectal: not examined  Extremities: Edema: none        Results for orders placed or performed in visit on 02/19/24 (from the past 24 hours)  POCT urine pregnancy   Collection Time: 02/19/24 10:39 AM  Result Value Ref Range   Preg Test, Ur Negative Negative  POCT hemoglobin   Collection Time: 02/19/24 11:24 AM  Result Value Ref Range   Hemoglobin 12.9 11 - 14.6 g/dL     IUD INSERTION The risks and benefits of the method and placement have been thouroughly reviewed with the patient and all questions were answered.  Specifically the patient is aware of failure rate of 12/998, expulsion of the IUD and of possible perforation.  The patient is aware of irregular bleeding due to the method and understands the incidence of irregular bleeding diminishes with time.  Signed copy of informed consent in chart.   Time out was performed.  A Pederson speculum was placed in the vagina.  The cervix was visualized, prepped using Betadine, and grasped with a single tooth tenaculum. The uterus was found to be retroflexed and it sounded to 7 cm.  Mirena  IUD placed per manufacturer's recommendations. The strings were trimmed to approximately 3 cm. The patient tolerated the procedure well.   Chaperone: Faith Rogue    Assessment & Plan:  1) Postpartum exam 2) 4.5 wks s/p spontaneous vaginal delivery 3) bottle feeding 4) Depression  screening> mildly pos; stable on Zoloft 50mg  5) Mirena IUD insertion 6) Stress incontinence> referral to pelvic floor PT and message sent to [facility]  The patient was given post procedure instructions, including signs and symptoms of infection and to check for the strings after each menses or each month, and refraining from intercourse or anything in the vagina for 3 days.  Condoms for 2 weeks. She was given a care card with date IUD placed, and date IUD to be removed.  She is scheduled for a f/u appointment in  4 weeks.  Essential components of care per ACOG recommendations:  1.  Mood and well being:  If positive depression screen, discussed and plan developed.  If using tobacco we discussed reduction/cessation and risk of relapse If current substance abuse, we discussed and referral to local resources was offered.   2. Infant care and feeding:  If breastfeeding, discussed returning to work, pumping, breastfeeding-associated pain, guidance regarding return to fertility while lactating if not using another method. If needed, patient was provided with a letter to be allowed to pump q 2-3hrs to support lactation in a private location with access to a refrigerator to store breastmilk.   Recommended that all caregivers be immunized for flu, pertussis and other preventable communicable diseases If pt does not have material needs met for her/baby, referred to local resources for help obtaining these.  3. Sexuality, contraception and birth spacing Provided guidance regarding sexuality, management of dyspareunia, and resumption of intercourse Discussed avoiding interpregnancy interval <34mths and recommended birth spacing of 18 months  4. Sleep and fatigue Discussed coping options for fatigue and sleep disruption Encouraged family/partner/community support of 4 hrs of uninterrupted sleep to help with mood and fatigue  5. Physical recovery  If pt had a C/S, assessed incisional pain and  providing guidance on normal vs prolonged recovery If pt had a laceration, perineal healing and pain reviewed.  If urinary or fecal incontinence, discussed management and referred to PT or uro/gyn if indicated  Patient is safe to resume physical activity. Discussed attainment of healthy weight.  6.  Chronic disease management Discussed pregnancy complications if any, and their implications for future childbearing and long-term maternal health. Review recommendations for prevention of recurrent pregnancy complications, such as 17 hydroxyprogesterone caproate to reduce risk for recurrent PTB not applicable, or aspirin to reduce risk of preeclampsia not applicable. Pt had GDM: No. If yes, 2hr GTT scheduled: not applicable. Reviewed medications and non-pregnant dosing including consideration of whether pt is breastfeeding using a reliable resource such as LactMed: not applicable Referred for f/u w/ PCP or subspecialist providers as indicated: not applicable  7. Health maintenance Mammogram at 25yo or earlier if indicated Pap smears as indicated  Meds:  Meds ordered this encounter  Medications   levonorgestrel (MIRENA) 20 MCG/DAY IUD 1 each    Follow-up: Return in about 4 weeks (around 03/18/2024) for IUD f/u.   Orders Placed This Encounter  Procedures   Ambulatory referral to Physical Therapy   POCT urine pregnancy   POCT hemoglobin    Arabella Merles [facility] 02/19/2024 12:16 PM

## 2024-02-19 NOTE — Patient Instructions (Addendum)
 Use the website www.postpartum.net for helpful postpartum resources!  Nothing in vagina for 3 days (no sex, douching, tampons, etc...) Check your strings once a month to make sure you can feel them, if you are not able to please let us know If you develop a fever of 100.4 or more in the next few weeks, or if you develop severe abdominal pain, please let [place] know Use a backup method of birth control, such as condoms, for 2 weeks

## 2024-03-18 ENCOUNTER — Ambulatory Visit: Payer: Medicaid Other | Admitting: Women's Health

## 2024-03-18 ENCOUNTER — Other Ambulatory Visit (HOSPITAL_COMMUNITY)
Admission: RE | Admit: 2024-03-18 | Discharge: 2024-03-18 | Disposition: A | Discharge status: Home / Self Care | Source: Ambulatory Visit | Attending: Women's Health | Admitting: Women's Health

## 2024-03-18 ENCOUNTER — Encounter: Payer: Self-pay | Admitting: Women's Health

## 2024-03-18 VITALS — BP 107/71 | HR 66 | Ht 65.0 in | Wt 165.0 lb

## 2024-03-18 DIAGNOSIS — Z30431 Encounter for routine checking of intrauterine contraceptive device: Secondary | ICD-10-CM | POA: Diagnosis not present

## 2024-03-18 DIAGNOSIS — N898 Other specified noninflammatory disorders of vagina: Secondary | ICD-10-CM

## 2024-03-18 DIAGNOSIS — Z30430 Encounter for insertion of intrauterine contraceptive device: Secondary | ICD-10-CM | POA: Insufficient documentation

## 2024-03-18 DIAGNOSIS — M5430 Sciatica, unspecified side: Secondary | ICD-10-CM

## 2024-03-18 DIAGNOSIS — Z124 Encounter for screening for malignant neoplasm of cervix: Secondary | ICD-10-CM | POA: Diagnosis present

## 2024-03-18 LAB — CERVICOVAGINAL ANCILLARY ONLY
Bacterial Vaginitis (gardnerella): NEGATIVE
Candida Glabrata: NEGATIVE
Candida Vaginitis: NEGATIVE
Chlamydia: NEGATIVE
Comment: NEGATIVE
Comment: NEGATIVE
Comment: NEGATIVE
Comment: NEGATIVE
Comment: NEGATIVE
Comment: NORMAL
Neisseria Gonorrhea: NEGATIVE
Trichomonas: NEGATIVE

## 2024-03-18 NOTE — Progress Notes (Signed)
 GYN VISIT Patient name: Vickie Sutton MRN [id number]  Date of birth: July 08, 2001 Chief Complaint:   Follow-up (IUD)  History of Present Illness:   Vickie Sutton is a 23 y.o. [phone] Hispanic female being seen today for f/u on Mirena inserted 02/19/24.  Still bleeding, other than that IUD has been fine. Some yellow d/c, no itching/odor/irritation. Still having sciatica that she had during pregnancy.  No LMP recorded. (Menstrual status: IUD). The current method of family planning is IUD.  Last pap 06/16/21. Results were: NILM w/ HRHPV not done     01/27/2024    1:31 PM 01/07/2024    8:36 AM 12/31/2023    8:37 AM 11/08/2023    9:10 AM 07/24/2023   11:08 AM  Depression screen PHQ 2/9  Decreased Interest 0 0 0 0 0  Down, Depressed, Hopeless 0 0 0 0 0  PHQ - 2 Score 0 0 0 0 0  Altered sleeping 0   1 0  Tired, decreased energy 2   3 1   Change in appetite 0   0 0  Feeling bad or failure about yourself  0   0 0  Trouble concentrating 0   0 0  Moving slowly or fidgety/restless 1   0 0  Suicidal thoughts 0   0 0  PHQ-9 Score 3   4 1         11/08/2023    9:11 AM 07/24/2023   11:08 AM 01/11/2023    9:48 AM 09/07/2022    9:06 AM  GAD 7 : Generalized Anxiety Score  Nervous, Anxious, on Edge 1 0 1 1  Control/stop worrying 0 0 0 1  Worry too much - different things 0 0 0 1  Trouble relaxing 1 0 0 0  Restless 0 0 0 0  Easily annoyed or irritable 1 1 1 1   Afraid - awful might happen 0 0 0 0  Total GAD 7 Score 3 1 2 4      Review of Systems:   Pertinent items are noted in HPI Denies fever/chills, dizziness, headaches, visual disturbances, fatigue, shortness of breath, chest pain, abdominal pain, vomiting, abnormal vaginal discharge/itching/odor/irritation, problems with periods, bowel movements, urination, or intercourse unless otherwise stated above.  Pertinent History Reviewed:  Reviewed past medical,surgical, social, obstetrical and family history.  Reviewed problem list,  medications and allergies. Physical Assessment:   Vitals:   03/18/24 0854  BP: 107/71  Pulse: 66  Weight: 165 lb (74.8 kg)  Height: 5\' 5"  (1.651 m)  Body mass index is 27.46 kg/m.       Physical Examination:   General appearance: alert, well appearing, and in no distress  Mental status: alert, oriented to person, place, and time  Skin: warm & dry   Cardiovascular: normal heart rate noted  Respiratory: normal respiratory effort, no distress  Abdomen: soft, non-tender   Pelvic: VULVA: normal appearing vulva with no masses, tenderness or lesions, VAGINA: normal appearing vagina with normal color and discharge, no lesions, CERVIX: normal appearing cervix without discharge or lesions, IUD strings visible, appropriate length, CV swab and pap obtained  Extremities: no edema   Chaperone: Latisha Cresenzo  No results found for this or any previous visit (from the past 24 hours).  Assessment & Plan:  1) IUD f/u> in place, discussed normal bleeding patterns  2) Vaginal d/c> CV swab  3) Sciatica> gave printed exercises, let me know if not helping  Meds: No orders of the defined types were placed in  this encounter.   No orders of the defined types were placed in this encounter.   Return in about 1 year (around 03/18/2025) for Physical.  Cheral Marker CNM, [facility] 03/18/2024 9:16 AM

## 2024-03-18 NOTE — Patient Instructions (Signed)

## 2024-03-18 NOTE — Addendum Note (Signed)
 Addended by: Moss Mc on: 03/18/2024 09:51 AM   Modules accepted: Orders

## 2024-03-19 ENCOUNTER — Encounter: Payer: Self-pay | Admitting: Women's Health

## 2024-03-19 LAB — CYTOLOGY - PAP

## 2024-03-23 ENCOUNTER — Encounter: Payer: Self-pay | Admitting: Women's Health

## 2024-03-23 DIAGNOSIS — R87619 Unspecified abnormal cytological findings in specimens from cervix uteri: Secondary | ICD-10-CM | POA: Insufficient documentation

## 2024-04-06 ENCOUNTER — Encounter: Payer: Self-pay | Admitting: Women's Health

## 2024-04-07 ENCOUNTER — Ambulatory Visit: Admitting: *Deleted

## 2024-04-07 ENCOUNTER — Other Ambulatory Visit (HOSPITAL_COMMUNITY)
Admission: RE | Admit: 2024-04-07 | Discharge: 2024-04-07 | Disposition: A | Discharge status: Home / Self Care | Source: Ambulatory Visit | Attending: Obstetrics & Gynecology | Admitting: Obstetrics & Gynecology

## 2024-04-07 DIAGNOSIS — N898 Other specified noninflammatory disorders of vagina: Secondary | ICD-10-CM

## 2024-04-07 DIAGNOSIS — N939 Abnormal uterine and vaginal bleeding, unspecified: Secondary | ICD-10-CM | POA: Diagnosis present

## 2024-04-07 NOTE — Progress Notes (Signed)
   NURSE VISIT- VAGINITIS/STD/POC  SUBJECTIVE:  Vickie Sutton is a 23 y.o. [phone] GYN patientfemale here for a vaginal swab for vaginitis screening, STD screen.  She reports the following symptoms: abnormal bleeding: flow is light, discharge described as malodorous and green, and odor for several days. Denies abnormal vaginal bleeding, significant pelvic pain, fever, or UTI symptoms.  OBJECTIVE:  There were no vitals taken for this visit.  Appears well, in no apparent distress  ASSESSMENT: Vaginal swab for STD screen  PLAN: Self-collected vaginal probe for Gonorrhea, Chlamydia, Trichomonas, Bacterial Vaginosis, Yeast sent to lab Treatment: to be determined once results are received Follow-up as needed if symptoms persist/worsen, or new symptoms develop  Kerrie Peek  04/07/2024 8:59 AM

## 2024-04-08 ENCOUNTER — Encounter: Payer: Self-pay | Admitting: Women's Health

## 2024-04-08 ENCOUNTER — Other Ambulatory Visit: Payer: Self-pay | Admitting: Women's Health

## 2024-04-08 LAB — CERVICOVAGINAL ANCILLARY ONLY
Bacterial Vaginitis (gardnerella): POSITIVE — AB
Candida Glabrata: NEGATIVE
Candida Vaginitis: POSITIVE — AB
Chlamydia: NEGATIVE
Comment: NEGATIVE
Comment: NEGATIVE
Comment: NEGATIVE
Comment: NEGATIVE
Comment: NEGATIVE
Comment: NORMAL
Neisseria Gonorrhea: NEGATIVE
Trichomonas: NEGATIVE

## 2024-04-08 MED ORDER — FLUCONAZOLE 150 MG PO TABS: 150.0000 mg | ORAL_TABLET | Freq: Once | ORAL | 0 refills | Status: AC

## 2024-04-08 MED ORDER — METRONIDAZOLE 500 MG PO TABS
500.0000 mg | ORAL_TABLET | Freq: Two times a day (BID) | ORAL | 0 refills | Status: DC
Start: 2024-04-08 — End: 2024-05-04

## 2024-04-15 ENCOUNTER — Encounter: Payer: Self-pay | Admitting: Women's Health

## 2024-04-15 ENCOUNTER — Ambulatory Visit (INDEPENDENT_AMBULATORY_CARE_PROVIDER_SITE_OTHER): Admitting: Women's Health

## 2024-04-15 VITALS — BP 106/66 | HR 70 | Ht 65.0 in | Wt 159.4 lb

## 2024-04-15 DIAGNOSIS — Z975 Presence of (intrauterine) contraceptive device: Secondary | ICD-10-CM

## 2024-04-15 DIAGNOSIS — N921 Excessive and frequent menstruation with irregular cycle: Secondary | ICD-10-CM | POA: Diagnosis not present

## 2024-04-15 MED ORDER — MEGESTROL ACETATE 40 MG PO TABS
ORAL_TABLET | ORAL | 1 refills | Status: AC
Start: 2024-04-15 — End: ?

## 2024-04-15 NOTE — Progress Notes (Signed)
 GYN VISIT Patient name: Vickie Sutton MRN [id number]  Date of birth: 2001/09/21 Chief Complaint:   Follow-up (Bleeding with iud)  History of Present Illness:   Vickie Sutton is a 23 y.o. [phone] Hispanic female being seen today for continued bleeding w/ Mirena  IUD inserted 02/19/24. Came 3/26 for same, CV swab +BV and candida, has finished meds for both and not improved. Some days light, some heavier. No pain w/ sex.  No LMP recorded. (Menstrual status: IUD). The current method of family planning is IUD.  Last pap 03/18/24. Results were: LSIL w/ HRHPV not done     01/27/2024    1:31 PM 01/07/2024    8:36 AM 12/31/2023    8:37 AM 11/08/2023    9:10 AM 07/24/2023   11:08 AM  Depression screen PHQ 2/9  Decreased Interest 0 0 0 0 0  Down, Depressed, Hopeless 0 0 0 0 0  PHQ - 2 Score 0 0 0 0 0  Altered sleeping 0   1 0  Tired, decreased energy 2   3 1   Change in appetite 0   0 0  Feeling bad or failure about yourself  0   0 0  Trouble concentrating 0   0 0  Moving slowly or fidgety/restless 1   0 0  Suicidal thoughts 0   0 0  PHQ-9 Score 3   4 1         11/08/2023    9:11 AM 07/24/2023   11:08 AM 01/11/2023    9:48 AM 09/07/2022    9:06 AM  GAD 7 : Generalized Anxiety Score  Nervous, Anxious, on Edge 1 0 1 1  Control/stop worrying 0 0 0 1  Worry too much - different things 0 0 0 1  Trouble relaxing 1 0 0 0  Restless 0 0 0 0  Easily annoyed or irritable 1 1 1 1   Afraid - awful might happen 0 0 0 0  Total GAD 7 Score 3 1 2 4      Review of Systems:   Pertinent items are noted in HPI Denies fever/chills, dizziness, headaches, visual disturbances, fatigue, shortness of breath, chest pain, abdominal pain, vomiting, abnormal vaginal discharge/itching/odor/irritation, problems with periods, bowel movements, urination, or intercourse unless otherwise stated above.  Pertinent History Reviewed:  Reviewed past medical,surgical, social, obstetrical and family history.  Reviewed  problem list, medications and allergies. Physical Assessment:   Vitals:   04/15/24 1607  BP: 106/66  Pulse: 70  Weight: 159 lb 6.4 oz (72.3 kg)  Height: 5\' 5"  (1.651 m)  Body mass index is 26.53 kg/m.       Physical Examination:   General appearance: alert, well appearing, and in no distress  Mental status: alert, oriented to person, place, and time  Skin: warm & dry   Cardiovascular: normal heart rate noted  Respiratory: normal respiratory effort, no distress  Abdomen: soft, non-tender   Pelvic: VULVA: normal appearing vulva with no masses, tenderness or lesions, VAGINA: normal appearing vagina with normal color and discharge, no lesions, no blood CERVIX: normal appearing cervix without discharge or lesions, IUD strings visible, appropriate length  Extremities: no edema   Chaperone: Steffanie Edouard  No results found for this or any previous visit (from the past 24 hours).  Assessment & Plan:  1) Persistent bleeding s/p Mirena  IUD insertion> s/p meds for BV/yeast and no change, normal exam today, discussed expected bleeding after IUD insertion, offered Megace - wants to try, rx sent  Meds:  Meds ordered this encounter  Medications   megestrol  (MEGACE ) 40 MG tablet    Sig: 3x5d, 2x5d, then 1 daily to help control vaginal bleeding. Stop taking 1 week after bleeding stops.    Dispense:  45 tablet    Refill:  1    No orders of the defined types were placed in this encounter.   Return for next year for physical.  Ferd Householder CNM, [facility] 04/15/2024 4:30 PM

## 2024-04-19 NOTE — Therapy (Signed)
 OUTPATIENT PHYSICAL THERAPY FEMALE PELVIC EVALUATION   Patient Name: Vickie Sutton MRN: [id number] DOB:2001-07-15, 23 y.o., female Today's Date: 04/20/2024  END OF SESSION:  PT End of Session - 04/20/24 1640     Visit Number 1    Authorization Type [facility] medicaid  [facility]    Authorization Time Period waiting on auth    PT Start Time 1230    PT Stop Time 1335    PT Time Calculation (min) 65 min    Activity Tolerance Patient tolerated treatment well    Behavior During Therapy WFL for tasks assessed/performed             Past Medical History:  Diagnosis Date   ADD (attention deficit disorder)    Anxiety    Asthma    Depression    Migraine    Panic attacks    Polycystic ovarian syndrome    POTS (postural orthostatic tachycardia syndrome)    Syncope    Neurocardiogenic suspected   Vasovagal syncope    Past Surgical History:  Procedure Laterality Date   ESOPHAGOGASTRODUODENOSCOPY     LAPAROTOMY N/A 09/07/2020   Procedure: EXPLORATORY LAPAROTOMY WITH EXTRACTION  OVARIAN CYST;  Surgeon: Wendelyn Halter, MD;  Location: [facility];  Service: Gynecology;  Laterality: N/A;   OVARIAN CYST SURGERY Right    ROUX-EN-Y GASTRIC BYPASS     August 2020 - [facility]   TOOTH EXTRACTION N/A 12/12/2015   Procedure: EXTRACTION MOLARS - teeth one, sixteen, seventeen and thirty-two;  Surgeon: Ascencion Lava, DDS;  Location: [facility];  Service: Oral Surgery;  Laterality: N/A;   Patient Active Problem List   Diagnosis Date Noted   Abnormal Pap smear of cervix 03/23/2024   Encounter for IUD insertion 03/18/2024   History of anemia 12/26/2023   Depression with anxiety 11/26/2022   Jerky body movements 02/26/2022   History of Roux-en-Y gastric bypass 08/08/2020   PCOS (polycystic ovarian syndrome) 08/08/2020   Syncope 07/25/2020   POTS (postural orthostatic tachycardia syndrome) 05/03/2020   Asthma 03/23/2019    PCP: Wendelyn Halter, MD  REFERRING PROVIDER: Jolayne Natter, CNM  REFERRING  DIAG: [phone] (ICD-10-CM) - Mixed stress and urge urinary incontinence   THERAPY DIAG:  Muscle weakness (generalized)  Other low back pain  Sciatica, right side  Rationale for Evaluation and Treatment: Rehabilitation  ONSET DATE: 2025  SUBJECTIVE:                                                                                                                                                                                           SUBJECTIVE STATEMENT: Pt reports that she has 3  children back to back. Redgie Cancer is 74 months old. Still bleeding from childbirth, Got an IUD. Has a tampon in today Dribbles without knowing Feels weak Lives in [place]. Has some sciatic nerve pain at times Cannot walk when it is bad, has been going on since 4/24.  This last pregnancy was hard, had to take muscle relaxants Works as a Agricultural engineer Sciatica comes and goes. Different positions aggravate it.  Never has had PT on it. Feels like her low back pain is d/t a lot of epidurals Gastric bypass surgery in the past Has GI issues  Fluid intake: a lot of water  PAIN:  Are you having pain? Yes- low back pain, comes and goes NPRS scale: 10/10   Aggravating factors: some seated positions Relieving factors: meds  PRECAUTIONS: None  RED FLAGS: None   WEIGHT BEARING RESTRICTIONS: No  FALLS:  Has patient fallen in last 6 months? 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  PLOF: Independent  PATIENT GOALS: reduce leaking  PERTINENT HISTORY:  Gastric bypass 2020 Sexual abuse: No  BOWEL MOVEMENT: she had a gastric bypass- surgery in [facility] 2020, was 340 lbs, diabetic, went to 140 lbs, some constipation Pain with bowel movement: Yes Type of bowel movement:Type (Bristol Stool Scale) 1-2 Fully empty rectum: No- sometimes not Leakage: No Pads: No Fiber supplement/laxative Yes at times  URINATION: Pain with urination: No Fully  empty bladder: Yes: when she is full Stream: Strong Urgency: Yes - pees every 5 secs Frequency: yes Leakage: Urge to void, Coughing, Sneezing, and Laughing- afraid to lift and exercise in the gym Pads: Yes: panty liners-3   INTERCOURSE:  Ability to have vaginal penetration Yes  Pain with intercourse: Deep Penetration, gets sore, feels inflammation, not using lubricant  Dryness No Climax: yes Marinoff Scale: 0/3 Laxative:yes at times  PREGNANCY: Vaginal deliveries 3 Tearing Yes:   Episiotomy No C-section deliveries 0 Currently pregnant No  PROLAPSE: Pressure   OBJECTIVE:  Note: Objective measures were completed at Evaluation unless otherwise noted.   PATIENT SURVEYS:   PFIQ-7: 37  COGNITION: Overall cognitive status: Within functional limits for tasks assessed     SENSATION: Light touch: Appears intact  LUMBAR SPECIAL TESTS:  Stork standing: Positive for bilateral knee valgus- hip weakness    GAIT: Assistive device utilized: None Comments: within functional limitations   POSTURE: rounded shoulders, forward head, decreased lumbar lordosis, decreased thoracic kyphosis, and flexed trunk    LUMBARAROM/PROM: guarded d/t pain, pt with a lot of difficulty and pain with supine to sit transfer, prone on elbows relieved pain  A/PROM A/PROM  Eval % available  Flexion 50  Extension 90   LOWER EXTREMITY ROM: grossly within functional limitations    LOWER EXTREMITY MMT: bilateral hip weakness 4/5  PALPATION:   General: tight and tender right piriformis  and lumbar paraspinals bilateral   Pelvic Alignment: dips to right  Abdominal: low tone, diastasis present                External Perineal Exam: deferred to next visit                             Internal Pelvic Floor: deferred to next visit  Patient confirms identification and approves PT to assess internal pelvic floor and treatment Yes  PELVIC MMT:   MMT eval  Vaginal   Internal Anal Sphincter  External Anal Sphincter   Puborectalis   Diastasis Recti 1 finger  (Blank rows = not tested)        TONE: To be assessed  PROLAPSE: To be assessed  TODAY'S TREATMENT:                                                                                                                              DATE: 04/20/24   EVAL see below   PATIENT EDUCATION:  Education details: relevant anatomy, expectations of PT, HEP. Exam findings Person educated: Patient Education method: Explanation, Demonstration, Tactile cues, Verbal cues, and Handouts Education comprehension: verbalized understanding, returned demonstration, verbal cues required, tactile cues required, and needs further education Trigger Point Dry Needling  Initial Treatment: Pt instructed on Dry Needling rational, procedures, and possible side effects. Pt instructed to expect mild to moderate muscle soreness later in the day and/or into the next day.  Pt instructed in methods to reduce muscle soreness. Pt instructed to continue prescribed HEP. Patient was educated on signs and symptoms of infection and other risk factors and advised to seek medical attention should they occur.  Patient verbalized understanding of these instructions and education.   Patient Verbal Consent Given: Yes Education Handout Provided: Yes Muscles Treated: right piriformis, bilateral left paraspinals Electrical Stimulation Performed: No Treatment Response/Outcome: less pain    HOME EXERCISE PROGRAM: EFEHNMEH  ASSESSMENT:  CLINICAL IMPRESSION: Patient is a 23 y.o. F who was seen today for physical therapy evaluation and treatment for mixed urinary incontinence. Pt presents with significant low back pain and back and abdominal weakness. Had difficulty with returning from supine to sit. She did well with trial of dry needling and prone press ups and piriformis stretch. Will see ortho as well. Demonstrates bilateral hip and knee weakness as well and will  benefit from physical therapy to reduce leaking and improve pelvic floor and core strength and coordination. 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 Taking care of her children has been difficult.   OBJECTIVE IMPAIRMENTS: decreased activity tolerance, decreased coordination, decreased endurance, difficulty walking, decreased strength, increased muscle spasms, impaired tone, improper body mechanics, and pain.   ACTIVITY LIMITATIONS: standing, sleeping, transfers, continence, toileting, and caring for others  PARTICIPATION LIMITATIONS: occupation  PERSONAL FACTORS: Time since onset of injury/illness/exacerbation are also affecting patient's functional outcome.   REHAB POTENTIAL: Good  CLINICAL DECISION MAKING: Evolving/moderate complexity  EVALUATION COMPLEXITY: Moderate   GOALS: Goals reviewed with patient? Yes  SHORT TERM GOALS: Target date: 05/18/2024    Pt will be independent with HEP.   Baseline: Goal status: INITIAL  2.  Pt will be independent with the knack, urge suppression technique, and double voiding in order to improve bladder habits and  decrease urinary incontinence.   Baseline:  Goal status: INITIAL  3.  Pt will demonstrate increase in all impaired hip strength by 0.5 muscle grade in order to demonstrate improved lumbopelvic support and increase functional ability.   Baseline:  Goal status: INITIAL  4.  Pt will report reducing low back pain to max 3/10 with her HEP Baseline:  Goal status: INITIAL    LONG TERM GOALS: Target date: 10/20/2024  Pt will be independent with advanced HEP.   Baseline:  Goal status: INITIAL  2.  Pt will be able to get from supine to sit without pain Baseline:  Goal status: INITIAL  3.  Pt will report max 2/10 pain with all  transfers to be able to work and care for her children without limitations Baseline:  Goal status: INITIAL  4.  Pt will soak 0 pads/ day to be able to participate in community activities Baseline:  Goal status: INITIAL  5.  Pt will be able to participate in an 45 mins exercise session without leaking Baseline:  Goal status: INITIAL  6.  Pt will be able to walk and transfer at work without leaking  Baseline:  Goal status: INITIAL  PLAN:  PT FREQUENCY: 1-2x/week  PT DURATION: 6 months  PLANNED INTERVENTIONS: 97110-Therapeutic exercises, 97530- Therapeutic activity, 97112- Neuromuscular re-education, 97535- Self Care, 69629- Manual therapy, 97032- Electrical stimulation (manual), Taping, Dry Needling, Joint mobilization, Joint manipulation, Spinal manipulation, Spinal mobilization, Scar mobilization, Cryotherapy, Moist heat, and Biofeedback  PLAN FOR NEXT SESSION: internal, continue exercises, strengthen low back and hips   Rutherford Alarie, PT 04/20/2024, 4:42 PM

## 2024-04-20 ENCOUNTER — Encounter: Payer: Self-pay | Admitting: Physical Therapy

## 2024-04-20 ENCOUNTER — Ambulatory Visit: Payer: Medicaid Other | Attending: Advanced Practice Midwife | Admitting: Physical Therapy

## 2024-04-20 ENCOUNTER — Other Ambulatory Visit: Payer: Self-pay

## 2024-04-20 DIAGNOSIS — M5459 Other low back pain: Secondary | ICD-10-CM | POA: Insufficient documentation

## 2024-04-20 DIAGNOSIS — M6281 Muscle weakness (generalized): Secondary | ICD-10-CM | POA: Diagnosis present

## 2024-04-20 DIAGNOSIS — N3946 Mixed incontinence: Secondary | ICD-10-CM | POA: Diagnosis not present

## 2024-04-20 DIAGNOSIS — M5431 Sciatica, right side: Secondary | ICD-10-CM | POA: Diagnosis present

## 2024-04-26 ENCOUNTER — Encounter: Payer: Self-pay | Admitting: Women's Health

## 2024-04-30 ENCOUNTER — Ambulatory Visit

## 2024-05-01 ENCOUNTER — Ambulatory Visit: Admitting: *Deleted

## 2024-05-01 ENCOUNTER — Other Ambulatory Visit (HOSPITAL_COMMUNITY)
Admission: RE | Admit: 2024-05-01 | Discharge: 2024-05-01 | Disposition: A | Discharge status: Home / Self Care | Source: Ambulatory Visit | Attending: Obstetrics & Gynecology | Admitting: Obstetrics & Gynecology

## 2024-05-01 DIAGNOSIS — R300 Dysuria: Secondary | ICD-10-CM | POA: Diagnosis not present

## 2024-05-01 DIAGNOSIS — N939 Abnormal uterine and vaginal bleeding, unspecified: Secondary | ICD-10-CM | POA: Diagnosis present

## 2024-05-01 LAB — POCT URINALYSIS DIPSTICK
Blood, UA: NEGATIVE
Glucose, UA: NEGATIVE
Ketones, UA: NEGATIVE
Leukocytes, UA: NEGATIVE
Nitrite, UA: NEGATIVE
Protein, UA: NEGATIVE

## 2024-05-01 NOTE — Progress Notes (Signed)
   NURSE VISIT- UTI SYMPTOMS   SUBJECTIVE:  Vickie Sutton is a 23 y.o. [phone] female here for UTI symptoms. She is a GYN patient. She reports dysuria.  OBJECTIVE:  There were no vitals taken for this visit.  Appears well, in no apparent distress  Results for orders placed or performed in visit on 05/01/24 (from the past 24 hours)  POCT Urinalysis Dipstick   Collection Time: 05/01/24  9:18 AM  Result Value Ref Range   Color, UA     Clarity, UA     Glucose, UA Negative Negative   Bilirubin, UA     Ketones, UA neg    Spec Grav, UA     Blood, UA neg    pH, UA     Protein, UA Negative Negative   Urobilinogen, UA     Nitrite, UA neg    Leukocytes, UA Negative Negative   Appearance     Odor      ASSESSMENT: GYN patient with UTI symptoms and negative nitrites  PLAN: Note routed to Lendia Quay, AGNP   Rx sent by provider today: No Urine culture sent Call or return to clinic prn if these symptoms worsen or fail to improve as anticipated. Follow-up: as needed   Also collected self swab for BV, Yeast, Trich, and GC/Chl.  Kerrie Peek  05/01/2024 9:22 AM

## 2024-05-02 LAB — URINALYSIS
Bilirubin, UA: NEGATIVE
Glucose, UA: NEGATIVE
Nitrite, UA: POSITIVE — AB
Protein,UA: NEGATIVE
RBC, UA: NEGATIVE
Specific Gravity, UA: 1.025 (ref 1.005–1.030)
Urobilinogen, Ur: 1 mg/dL (ref 0.2–1.0)
pH, UA: 7 (ref 5.0–7.5)

## 2024-05-04 ENCOUNTER — Other Ambulatory Visit: Payer: Self-pay | Admitting: Adult Health

## 2024-05-04 LAB — URINE CULTURE

## 2024-05-04 LAB — CERVICOVAGINAL ANCILLARY ONLY
Bacterial Vaginitis (gardnerella): POSITIVE — AB
Candida Glabrata: NEGATIVE
Candida Vaginitis: POSITIVE — AB
Chlamydia: NEGATIVE
Comment: NEGATIVE
Comment: NEGATIVE
Comment: NEGATIVE
Comment: NEGATIVE
Comment: NEGATIVE
Comment: NORMAL
Neisseria Gonorrhea: NEGATIVE
Trichomonas: NEGATIVE

## 2024-05-04 MED ORDER — SULFAMETHOXAZOLE-TRIMETHOPRIM 800-160 MG PO TABS: 1.0000 | ORAL_TABLET | Freq: Two times a day (BID) | ORAL | 0 refills | Status: DC

## 2024-05-04 MED ORDER — METRONIDAZOLE 500 MG PO TABS: 500.0000 mg | ORAL_TABLET | Freq: Two times a day (BID) | ORAL | 0 refills | Status: AC

## 2024-05-04 MED ORDER — FLUCONAZOLE 150 MG PO TABS: ORAL_TABLET | ORAL | 1 refills | Status: DC

## 2024-05-04 NOTE — Progress Notes (Signed)
+  BV and yeast on vaginal swab and urine +  E coli, rx has been sent for flagyl , diflucan  and septra ds, no sex or alcohol while taking meds and push fluids

## 2024-05-14 ENCOUNTER — Ambulatory Visit: Payer: Self-pay | Admitting: Physical Therapy

## 2024-06-05 ENCOUNTER — Encounter: Payer: Self-pay | Admitting: Physical Therapy

## 2024-06-05 ENCOUNTER — Ambulatory Visit: Payer: Self-pay | Attending: Advanced Practice Midwife | Admitting: Physical Therapy

## 2024-06-05 DIAGNOSIS — M5459 Other low back pain: Secondary | ICD-10-CM | POA: Diagnosis present

## 2024-06-05 DIAGNOSIS — M6281 Muscle weakness (generalized): Secondary | ICD-10-CM | POA: Insufficient documentation

## 2024-06-05 DIAGNOSIS — M5431 Sciatica, right side: Secondary | ICD-10-CM | POA: Diagnosis present

## 2024-06-05 NOTE — Therapy (Signed)
 OUTPATIENT PHYSICAL THERAPY FEMALE PELVIC TREATMENT   Patient Name: Vickie Sutton MRN: [id number] DOB:02-May-2001, 23 y.o., female Today's Date: 06/05/2024  END OF SESSION:  PT End of Session - 06/05/24 1110     Visit Number 2    Authorization Type [facility]  [facility]    Authorization Time Period RADMD Approved 4 visits- 04/20/2024-06/19/2024-author#[id number]    PT Start Time 1105    PT Stop Time 1153    PT Time Calculation (min) 48 min    Activity Tolerance Patient tolerated treatment well    Behavior During Therapy WFL for tasks assessed/performed           Past Medical History:  Diagnosis Date   ADD (attention deficit disorder)    Anxiety    Asthma    Depression    Migraine    Panic attacks    Polycystic ovarian syndrome    POTS (postural orthostatic tachycardia syndrome)    Syncope    Neurocardiogenic suspected   Vasovagal syncope    Past Surgical History:  Procedure Laterality Date   ESOPHAGOGASTRODUODENOSCOPY     LAPAROTOMY N/A 09/07/2020   Procedure: EXPLORATORY LAPAROTOMY WITH EXTRACTION  OVARIAN CYST;  Surgeon: Wendelyn Halter, MD;  Location: [facility];  Service: Gynecology;  Laterality: N/A;   OVARIAN CYST SURGERY Right    ROUX-EN-Y GASTRIC BYPASS     August 2020 - [facility]   TOOTH EXTRACTION N/A 12/12/2015   Procedure: EXTRACTION MOLARS - teeth one, sixteen, seventeen and thirty-two;  Surgeon: Ascencion Lava, DDS;  Location: [facility];  Service: Oral Surgery;  Laterality: N/A;   Patient Active Problem List   Diagnosis Date Noted   Abnormal Pap smear of cervix 03/23/2024   Encounter for IUD insertion 03/18/2024   History of anemia 12/26/2023   Depression with anxiety 11/26/2022   Jerky body movements 02/26/2022   History of Roux-en-Y gastric bypass 08/08/2020   PCOS (polycystic ovarian syndrome) 08/08/2020   Syncope 07/25/2020   POTS (postural orthostatic tachycardia syndrome) 05/03/2020   Asthma 03/23/2019    PCP: Wendelyn Halter, MD  REFERRING  PROVIDER: Jolayne Natter, CNM  REFERRING DIAG: [phone] (ICD-10-CM) - Mixed stress and urge urinary incontinence   THERAPY DIAG:  Muscle weakness (generalized)  Sciatica, right side  Other low back pain  Rationale for Evaluation and Treatment: Rehabilitation  ONSET DATE: 2025  SUBJECTIVE:                                                                                                                                                                                           SUBJECTIVE STATEMENT: Pt reports that has increased heart  rate at times and is afraid to exercise in the gym. Her back pain has been increased to every day Tries to exercise at home.  Has BP drop and tachycardia and working with a cardiologist at [employer].  Bleeding subsided, but still peeing on herself, She just did at [facility] this past week.  Has had some tenderness with intercourse. Not nursing Back pain on right ( sacrum in the middle), pain shoots down into both legs, it is numb, pain subsides. Repositioning relieves it. Feels pressure. Standing increases pressure Dry needling helped last time    Fluid intake: a lot of water  PAIN:  Are you having pain? Yes- low back pain, comes and goes NPRS scale: 10/10   Aggravating factors: some seated positions Relieving factors: meds  PRECAUTIONS: None  RED FLAGS: None   WEIGHT BEARING RESTRICTIONS: No  FALLS:  Has patient fallen in last 6 months? 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  PLOF: Independent  PATIENT GOALS: reduce leaking  PERTINENT HISTORY:  Gastric bypass 2020 Sexual abuse: No  BOWEL MOVEMENT: she had a gastric bypass- surgery in [facility] 2020, was 340 lbs, diabetic, went to 140 lbs, some constipation Pain with bowel movement: Yes Type of bowel movement:Type (Bristol Stool Scale) 1-2 Fully empty rectum: No- sometimes not Leakage: No Pads: No Fiber supplement/laxative Yes  at times  URINATION: Pain with urination: No Fully empty bladder: Yes: when she is full Stream: Strong Urgency: Yes - pees every 5 secs Frequency: yes Leakage: Urge to void, Coughing, Sneezing, and Laughing- afraid to lift and exercise in the gym Pads: Yes: panty liners-3   INTERCOURSE:  Ability to have vaginal penetration Yes  Pain with intercourse: Deep Penetration, gets sore, feels inflammation, not using lubricant  Dryness No Climax: yes Marinoff Scale: 0/3 Laxative:yes at times  PREGNANCY: Vaginal deliveries 3 Tearing Yes:   Episiotomy No C-section deliveries 0 Currently pregnant No  PROLAPSE: Pressure   OBJECTIVE:  Note: Objective measures were completed at Evaluation unless otherwise noted.   PATIENT SURVEYS:   PFIQ-7: 57  COGNITION: Overall cognitive status: Within functional limits for tasks assessed     SENSATION: Light touch: Appears intact  LUMBAR SPECIAL TESTS:  Stork standing: Positive for bilateral knee valgus- hip weakness    GAIT: Assistive device utilized: None Comments: within functional limitations   POSTURE: rounded shoulders, forward head, decreased lumbar lordosis, decreased thoracic kyphosis, and flexed trunk    LUMBARAROM/PROM: guarded d/t pain, pt with a lot of difficulty and pain with supine to sit transfer, prone on elbows relieved pain  A/PROM A/PROM  Eval % available  Flexion 50  Extension 90   LOWER EXTREMITY ROM: grossly within functional limitations    LOWER EXTREMITY MMT: bilateral hip weakness 4/5  PALPATION:   General: tight and tender right piriformis  and lumbar paraspinals bilateral   Pelvic Alignment: dips to right  Abdominal: low tone, diastasis present                External Perineal Exam: mild dryness present, labia visible, decent mobility of clitoral hood                             Internal Pelvic Floor: superficial- mild  tightness and restriction  of perineal scar present and bilateral  bulbocavernosus, pt felt mild burning  Patient confirms identification and approves PT to assess internal pelvic floor and  treatment Yes Patient confirms identification and approves PT to assess internal pelvic floor and treatment Yes No emotional/communication barriers or cognitive limitation. Patient is motivated to learn. Patient understands and agrees with treatment goals and plan. PT explains patient will be examined in standing, sitting, and lying down to see how their muscles and joints work. When they are ready, they will be asked to remove their underwear so PT can examine their perineum. The patient is also given the option of providing their own chaperone as one is not provided in our facility. The patient also has the right and is explained the right to defer or refuse any part of the evaluation or treatment including the internal exam. With the patient's consent, PT will use one gloved finger to gently assess the muscles of the pelvic floor, seeing how well it contracts and relaxes and if there is muscle symmetry. After, the patient will get dressed and PT and patient will discuss exam findings and plan of care. PT and patient discuss plan of care, schedule, attendance policy and HEP activities.     PELVIC MMT:   MMT eval  Vaginal 3/5 MMT, improved with transverse abdominis breath and with hip adduction with knees  Internal Anal Sphincter   External Anal Sphincter   Puborectalis   Diastasis Recti 1 finger at, above and below umbilicus  (Blank rows = not tested)        TONE:low   PROLAPSE: anterior and posterior vaginal laxity in hooklying   TODAY'S TREATMENT:                                                                                                                              DATE: 06/05/24   There act- review of progress, new symptoms, exercises Neurom- feedback for pelvic floor muscle exercises There ex- piriformis stretch, sciatic flossing, pelvic floor contractions,  hip adduction with ball with transverse abdominis breath with PF contraction   PATIENT EDUCATION:  Education details: relevant anatomy, expectations of PT, HEP. Exam findings Person educated: Patient Education method: Explanation, Demonstration, Tactile cues, Verbal cues, and Handouts Education comprehension: verbalized understanding, returned demonstration, verbal cues required, tactile cues required, and needs further education Trigger Point Dry Needling  Initial Treatment: Pt instructed on Dry Needling rational, procedures, and possible side effects. Pt instructed to expect mild to moderate muscle soreness later in the day and/or into the next day.  Pt instructed in methods to reduce muscle soreness. Pt instructed to continue prescribed HEP. Patient was educated on signs and symptoms of infection and other risk factors and advised to seek medical attention should they occur.  Patient verbalized understanding of these instructions and education.   Patient Verbal Consent Given: Yes Education Handout Provided: Yes Muscles Treated: right piriformis, bilateral left paraspinals Electrical Stimulation Performed: No Treatment Response/Outcome: less pain    HOME EXERCISE PROGRAM: EFEHNMEH  ASSESSMENT:  CLINICAL IMPRESSION: Patient is a 23 y.o. F who was seen today for physical  therapy  treatment for mixed urinary incontinence. Pt presents with significant low back pain and back and abdominal weakness. Had difficulty with returning from supine to sit. She did well with trial of dry needling last visit  and prone press ups and piriformis stretch. Will see ortho as well. Demonstrates bilateral hip and knee weakness as well and will benefit from physical therapy to reduce leaking and improve pelvic floor and core strength and coordination. Pt has not been able to exercise and recover from 3 vaginal deliveries in under 3 years. Pt dem pelvic floor weakness, anterior and posterior vaginal wall  laxity ( checked in hook lying with internal pelvic floor assessment) , diastasis rectus and hip weakness and well and back and abdominal weakness. She will benefit from PT to address deficits.    OBJECTIVE IMPAIRMENTS: decreased activity tolerance, decreased coordination, decreased endurance, difficulty walking, decreased strength, increased muscle spasms, impaired tone, improper body mechanics, and pain.   ACTIVITY LIMITATIONS: standing, sleeping, transfers, continence, toileting, and caring for others  PARTICIPATION LIMITATIONS: occupation  PERSONAL FACTORS: Time since onset of injury/illness/exacerbation are also affecting patient's functional outcome.   REHAB POTENTIAL: Good  CLINICAL DECISION MAKING: Evolving/moderate complexity  EVALUATION COMPLEXITY: Moderate   GOALS: Goals reviewed with patient? Yes  SHORT TERM GOALS: Target date: 05/18/2024    Pt will be independent with HEP.   Baseline: Goal status: INITIAL  2.  Pt will be independent with the knack, urge suppression technique, and double voiding in order to improve bladder habits and decrease urinary incontinence.   Baseline:  Goal status: INITIAL  3.  Pt will demonstrate increase in all impaired hip strength by 0.5 muscle grade in order to demonstrate improved lumbopelvic support and increase functional ability.   Baseline:  Goal status: INITIAL  4.  Pt will report reducing low back pain to max 3/10 with her HEP Baseline:  Goal status: INITIAL    LONG TERM GOALS: Target date: 10/20/2024  Pt will be independent with advanced HEP.   Baseline:  Goal status: INITIAL  2.  Pt will be able to get from supine to sit without pain Baseline:  Goal status: INITIAL  3.  Pt will report max 2/10 pain with all transfers to be able to work and care for her children without limitations Baseline:  Goal status: INITIAL  4.  Pt will soak 0 pads/ day to be able to participate in community activities Baseline:  Goal  status: INITIAL  5.  Pt will be able to participate in an 45 mins exercise session without leaking Baseline:  Goal status: INITIAL  6.  Pt will be able to walk and transfer at work without leaking  Baseline:  Goal status: INITIAL  PLAN:  PT FREQUENCY: 1-2x/week  PT DURATION: 6 months  PLANNED INTERVENTIONS: 97110-Therapeutic exercises, 97530- Therapeutic activity, [id number]- Neuromuscular re-education, 97535- Self Care, 96045- Manual therapy, 97032- Electrical stimulation (manual), Taping, Dry Needling, Joint mobilization, Joint manipulation, Spinal manipulation, Spinal mobilization, Scar mobilization, Cryotherapy, Moist heat, and Biofeedback  PLAN FOR NEXT SESSION: internal, continue exercises, strengthen low back and hips, work on urge suppression strategies.    Johnsie Moscoso, PT 06/05/2024, 12:09 PM

## 2024-06-15 ENCOUNTER — Ambulatory Visit

## 2024-06-15 DIAGNOSIS — M5459 Other low back pain: Secondary | ICD-10-CM

## 2024-06-15 DIAGNOSIS — M6281 Muscle weakness (generalized): Secondary | ICD-10-CM | POA: Diagnosis not present

## 2024-06-15 DIAGNOSIS — M5431 Sciatica, right side: Secondary | ICD-10-CM

## 2024-06-15 NOTE — Therapy (Signed)
 OUTPATIENT PHYSICAL THERAPY FEMALE PELVIC TREATMENT   Patient Name: Vickie Sutton MRN: [id number] DOB:February 08, 2001, 23 y.o., female Today's Date: 06/15/2024  END OF SESSION:  PT End of Session - 06/15/24 1442     Visit Number 3    Date for PT Re-Evaluation 10/20/24    Authorization Type [facility] medicaid  [facility]    Authorization Time Period RADMD Approved 4 visits- 04/20/2024-06/19/2024-author#[id number]- requested more visits    Authorization - Visit Number 2    Authorization - Number of Visits 4    PT Start Time 1403    PT Stop Time 1446    PT Time Calculation (min) 43 min    Activity Tolerance Patient tolerated treatment well    Behavior During Therapy WFL for tasks assessed/performed            Past Medical History:  Diagnosis Date   ADD (attention deficit disorder)    Anxiety    Asthma    Depression    Migraine    Panic attacks    Polycystic ovarian syndrome    POTS (postural orthostatic tachycardia syndrome)    Syncope    Neurocardiogenic suspected   Vasovagal syncope    Past Surgical History:  Procedure Laterality Date   ESOPHAGOGASTRODUODENOSCOPY     LAPAROTOMY N/A 09/07/2020   Procedure: EXPLORATORY LAPAROTOMY WITH EXTRACTION  OVARIAN CYST;  Surgeon: Jayne Vonn DEL, MD;  Location: [facility];  Service: Gynecology;  Laterality: N/A;   OVARIAN CYST SURGERY Right    ROUX-EN-Y GASTRIC BYPASS     August 2020 - [facility]   TOOTH EXTRACTION N/A 12/12/2015   Procedure: EXTRACTION MOLARS - teeth one, sixteen, seventeen and thirty-two;  Surgeon: Glendia Primrose, DDS;  Location: [facility];  Service: Oral Surgery;  Laterality: N/A;   Patient Active Problem List   Diagnosis Date Noted   Abnormal Pap smear of cervix 03/23/2024   Encounter for IUD insertion 03/18/2024   History of anemia 12/26/2023   Depression with anxiety 11/26/2022   Jerky body movements 02/26/2022   History of Roux-en-Y gastric bypass 08/08/2020   PCOS (polycystic ovarian syndrome) 08/08/2020   Syncope  07/25/2020   POTS (postural orthostatic tachycardia syndrome) 05/03/2020   Asthma 03/23/2019    PCP: Jayne Vonn DEL, MD  REFERRING PROVIDER: Loreli Suzen BIRCH, CNM  REFERRING DIAG: [phone] (ICD-10-CM) - Mixed stress and urge urinary incontinence   THERAPY DIAG:  Muscle weakness (generalized)  Sciatica, right side  Other low back pain  Rationale for Evaluation and Treatment: Rehabilitation  ONSET DATE: 2025  SUBJECTIVE:  SUBJECTIVE STATEMENT: I am still hurting.  No change in back and leg pain.  I want to ask for some imaging.     Pt reports that has increased heart rate at times and is afraid to exercise in the gym. Her back pain has been increased to every day Tries to exercise at home.  Has BP drop and tachycardia and working with a cardiologist at [employer].  Bleeding subsided, but still peeing on herself, She just did at [facility] this past week.  Has had some tenderness with intercourse. Not nursing Back pain on right ( sacrum in the middle), pain shoots down into both legs, it is numb, pain subsides. Repositioning relieves it. Feels pressure. Standing increases pressure Dry needling helped last time    Fluid intake: a lot of water  PAIN: 06/15/24 Are you having pain? Yes- low back pain, down both legs NPRS scale: 6/10 Aggravating factors: some seated positions, standing and walking Relieving factors: meds  PRECAUTIONS: None  RED FLAGS: None   WEIGHT BEARING RESTRICTIONS: No  FALLS:  Has patient fallen in last 6 months? 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  PLOF: Independent  PATIENT GOALS: reduce leaking  PERTINENT HISTORY:  Gastric bypass 2020 Sexual abuse: No  BOWEL MOVEMENT: she had a gastric bypass-  surgery in [facility] 2020, was 340 lbs, diabetic, went to 140 lbs, some constipation Pain with bowel movement: Yes Type of bowel movement:Type (Bristol Stool Scale) 1-2 Fully empty rectum: No- sometimes not Leakage: No Pads: No Fiber supplement/laxative Yes at times  URINATION: Pain with urination: No Fully empty bladder: Yes: when she is full Stream: Strong Urgency: Yes - pees every 5 secs Frequency: yes Leakage: Urge to void, Coughing, Sneezing, and Laughing- afraid to lift and exercise in the gym Pads: Yes: panty liners-3   INTERCOURSE:  Ability to have vaginal penetration Yes  Pain with intercourse: Deep Penetration, gets sore, feels inflammation, not using lubricant  Dryness No Climax: yes Marinoff Scale: 0/3 Laxative:yes at times  PREGNANCY: Vaginal deliveries 3 Tearing Yes:   Episiotomy No C-section deliveries 0 Currently pregnant No  PROLAPSE: Pressure   OBJECTIVE:  Note: Objective measures were completed at Evaluation unless otherwise noted.   PATIENT SURVEYS:  PFIQ-7: 58 06/15/24: Modified Oswestry:   COGNITION: Overall cognitive status: Within functional limits for tasks assessed     SENSATION: Light touch: Appears intact  LUMBAR SPECIAL TESTS:  Stork standing: Positive for bilateral knee valgus- hip weakness    GAIT: Assistive device utilized: None Comments: within functional limitations   POSTURE: rounded shoulders, forward head, decreased lumbar lordosis, decreased thoracic kyphosis, and flexed trunk    LUMBARAROM/PROM: guarded d/t pain, pt with a lot of difficulty and pain with supine to sit transfer, prone on elbows relieved pain  A/PROM A/PROM  Eval % available  Flexion 50  Extension 90   LOWER EXTREMITY ROM: grossly within functional limitations    LOWER EXTREMITY MMT: bilateral hip weakness 4/5  PALPATION:   General: tight and tender right piriformis  and lumbar paraspinals bilateral   Pelvic Alignment: dips to  right  Abdominal: low tone, diastasis present                External Perineal Exam: mild dryness present, labia visible, decent mobility of clitoral hood  Internal Pelvic Floor: superficial- mild  tightness and restriction  of perineal scar present and bilateral bulbocavernosus, pt felt mild burning  Patient confirms identification and approves PT to assess internal pelvic floor and treatment Yes Patient confirms identification and approves PT to assess internal pelvic floor and treatment Yes No emotional/communication barriers or cognitive limitation. Patient is motivated to learn. Patient understands and agrees with treatment goals and plan. PT explains patient will be examined in standing, sitting, and lying down to see how their muscles and joints work. When they are ready, they will be asked to remove their underwear so PT can examine their perineum. The patient is also given the option of providing their own chaperone as one is not provided in our facility. The patient also has the right and is explained the right to defer or refuse any part of the evaluation or treatment including the internal exam. With the patient's consent, PT will use one gloved finger to gently assess the muscles of the pelvic floor, seeing how well it contracts and relaxes and if there is muscle symmetry. After, the patient will get dressed and PT and patient will discuss exam findings and plan of care. PT and patient discuss plan of care, schedule, attendance policy and HEP activities.     PELVIC MMT:   MMT eval  Vaginal 3/5 MMT, improved with transverse abdominis breath and with hip adduction with knees  Internal Anal Sphincter   External Anal Sphincter   Puborectalis   Diastasis Recti 1 finger at, above and below umbilicus  (Blank rows = not tested)        TONE:low   PROLAPSE: anterior and posterior vaginal laxity in hooklying   TODAY'S TREATMENT:                                                                                                                               DATE: 06/15/24   NuStep: level 5x6 minutes-PT present to discuss progress Sit to stand with 5# kettle bell- exhale on exertion x10 Modified Oswestry 18/50=36%  Hamstring and figure 4 stretch: seated 2x20 seconds bil.  TA and pelvic activation with hip adduction into ball: 5 hold x10, hip abduction against band (blue loop) x10 Sidelying clam x10 bil each   DATE: 06/05/24   There act- review of progress, new symptoms, exercises Neurom- feedback for pelvic floor muscle exercises There ex- piriformis stretch, sciatic flossing, pelvic floor contractions, hip adduction with ball with transverse abdominis breath with PF contraction   PATIENT EDUCATION:  Education details: relevant anatomy, expectations of PT, HEP. Exam findings Person educated: Patient Education method: Explanation, Demonstration, Tactile cues, Verbal cues, and Handouts Education comprehension: verbalized understanding, returned demonstration, verbal cues required, tactile cues required, and needs further education Trigger Point Dry Needling  Initial Treatment: Pt instructed on Dry Needling rational, procedures, and possible side effects. Pt instructed to expect mild to moderate muscle soreness later in the day and/or into the next  day.  Pt instructed in methods to reduce muscle soreness. Pt instructed to continue prescribed HEP. Patient was educated on signs and symptoms of infection and other risk factors and advised to seek medical attention should they occur.  Patient verbalized understanding of these instructions and education.   Patient Verbal Consent Given: Yes Education Handout Provided: Yes Muscles Treated: right piriformis, bilateral left paraspinals Electrical Stimulation Performed: No Treatment Response/Outcome: less pain    HOME EXERCISE PROGRAM: EFEHNMEH  ASSESSMENT:  CLINICAL IMPRESSION: Pt denies any  change in her overall symptoms due to only 2 sessions since evaluation.  She continues to perform HEP and reports some increased pain while doing this but this doesn't last after so she remains compliant. Pt is using breathing to reduce episodes of incontinence.  Pt reports that she has had dribbling but not major accidents since last session.  Modified Oswestry is 18/50=36% perceived disability.  PT monitored throughout session for pain, fatigue and technique. She fatigues quickly with exercise today.  Patient will benefit from skilled PT to address the below impairments and improve overall function.   OBJECTIVE IMPAIRMENTS: decreased activity tolerance, decreased coordination, decreased endurance, difficulty walking, decreased strength, increased muscle spasms, impaired tone, improper body mechanics, and pain.   ACTIVITY LIMITATIONS: standing, sleeping, transfers, continence, toileting, and caring for others  PARTICIPATION LIMITATIONS: occupation  PERSONAL FACTORS: Time since onset of injury/illness/exacerbation are also affecting patient's functional outcome.   REHAB POTENTIAL: Good  CLINICAL DECISION MAKING: Evolving/moderate complexity  EVALUATION COMPLEXITY: Moderate   GOALS: Goals reviewed with patient? Yes  SHORT TERM GOALS: Target date: 05/18/2024    Pt will be independent with HEP.   Baseline: Goal status: In progress   2.  Pt will be independent with the knack, urge suppression technique, and double voiding in order to improve bladder habits and decrease urinary incontinence.   Baseline: using this with movement and has had fewer major accidents (06/15/24) Goal status: in progress   3.  Pt will demonstrate increase in all impaired hip strength by 0.5 muscle grade in order to demonstrate improved lumbopelvic support and increase functional ability.   Baseline:  Goal status: in progress   4.  Pt will report reducing low back pain to max 3/10 with her HEP Baseline: 6/10  (06/15/24) Goal status: In progress     LONG TERM GOALS: Target date: 10/20/2024  Pt will be independent with advanced HEP.   Baseline:  Goal status: INITIAL  2.  Pt will be able to get from supine to sit without pain Baseline:  Goal status: INITIAL  3.  Pt will report max 2/10 pain with all transfers to be able to work and care for her children without limitations Baseline:  Goal status: INITIAL  4.  Pt will soak 0 pads/ day to be able to participate in community activities Baseline:  Goal status: INITIAL  5.  Pt will be able to participate in an 45 mins exercise session without leaking Baseline:  Goal status: INITIAL  6.  Pt will be able to walk and transfer at work without leaking  Baseline:  Goal status: INITIAL  PLAN:  PT FREQUENCY: 1-2x/week  PT DURATION: 6 months  PLANNED INTERVENTIONS: 97110-Therapeutic exercises, 97530- Therapeutic activity, 97112- Neuromuscular re-education, 97535- Self Care, [id number]- Manual therapy, [phone]- Electrical stimulation (manual), Taping, Dry Needling, Joint mobilization, Joint manipulation, Spinal manipulation, Spinal mobilization, Scar mobilization, Cryotherapy, Moist heat, and Biofeedback  PLAN FOR NEXT SESSION: internal, continue exercises, strengthen low back and hips, work on urge  suppression strategies. Check on visits.     Daril Warga, PT 06/15/2024, 2:52 PM

## 2024-06-17 ENCOUNTER — Ambulatory Visit

## 2024-06-17 ENCOUNTER — Telehealth: Payer: Self-pay

## 2024-06-17 NOTE — Telephone Encounter (Signed)
 PT called pt due to no-show today.  Her number is not in service.

## 2024-06-20 ENCOUNTER — Encounter: Payer: Self-pay | Admitting: Women's Health

## 2024-06-24 ENCOUNTER — Ambulatory Visit: Attending: Advanced Practice Midwife | Admitting: Rehabilitative and Restorative Service Providers"

## 2024-06-24 ENCOUNTER — Ambulatory Visit

## 2024-06-24 ENCOUNTER — Other Ambulatory Visit (HOSPITAL_COMMUNITY)
Admission: RE | Admit: 2024-06-24 | Discharge: 2024-06-24 | Disposition: A | Discharge status: Home / Self Care | Source: Ambulatory Visit | Attending: Obstetrics & Gynecology | Admitting: Obstetrics & Gynecology

## 2024-06-24 ENCOUNTER — Encounter: Payer: Self-pay | Admitting: Rehabilitative and Restorative Service Providers"

## 2024-06-24 ENCOUNTER — Ambulatory Visit (INDEPENDENT_AMBULATORY_CARE_PROVIDER_SITE_OTHER): Admitting: *Deleted

## 2024-06-24 VITALS — Wt 150.0 lb

## 2024-06-24 DIAGNOSIS — M5431 Sciatica, right side: Secondary | ICD-10-CM | POA: Insufficient documentation

## 2024-06-24 DIAGNOSIS — N939 Abnormal uterine and vaginal bleeding, unspecified: Secondary | ICD-10-CM | POA: Insufficient documentation

## 2024-06-24 DIAGNOSIS — R262 Difficulty in walking, not elsewhere classified: Secondary | ICD-10-CM | POA: Insufficient documentation

## 2024-06-24 DIAGNOSIS — M5459 Other low back pain: Secondary | ICD-10-CM | POA: Diagnosis present

## 2024-06-24 DIAGNOSIS — R252 Cramp and spasm: Secondary | ICD-10-CM | POA: Insufficient documentation

## 2024-06-24 DIAGNOSIS — M6281 Muscle weakness (generalized): Secondary | ICD-10-CM | POA: Diagnosis present

## 2024-06-24 NOTE — Therapy (Signed)
 OUTPATIENT PHYSICAL THERAPY FEMALE PELVIC TREATMENT   Patient Name: Vickie Sutton MRN: [id number] DOB:09-15-2001, 23 y.o., female Today's Date: 06/24/2024  END OF SESSION:  PT End of Session - 06/24/24 0812     Visit Number 4    Date for PT Re-Evaluation 10/20/24    Authorization Type [facility] med[facility]  [facility]    Authorization Time Period RADMD Approved 12 visits- 04/20/2024-08/20/24    Authorization - Visit Number 3    Authorization - Number of Visits 12    PT Start Time 0810    PT Stop Time 0848    PT Time Calculation (min) 38 min    Activity Tolerance Patient tolerated treatment well    Behavior During Therapy WFL for tasks assessed/performed            Past Medical History:  Diagnosis Date   ADD (attention deficit disorder)    Anxiety    Asthma    Depression    Migraine    Panic attacks    Polycystic ovarian syndrome    POTS (postural orthostatic tachycardia syndrome)    Syncope    Neurocardiogenic suspected   Vasovagal syncope    Past Surgical History:  Procedure Laterality Date   ESOPHAGOGASTRODUODENOSCOPY     LAPAROTOMY N/A 09/07/2020   Procedure: EXPLORATORY LAPAROTOMY WITH EXTRACTION  OVARIAN CYST;  Surgeon: Jayne Vonn DEL, MD;  Location: [facility];  Service: Gynecology;  Laterality: N/A;   OVARIAN CYST SURGERY Right    ROUX-EN-Y GASTRIC BYPASS     August 2020 - [facility]   TOOTH EXTRACTION N/A 12/12/2015   Procedure: EXTRACTION MOLARS - teeth one, sixteen, seventeen and thirty-two;  Surgeon: Glendia Primrose, DDS;  Location: [facility];  Service: Oral Surgery;  Laterality: N/A;   Patient Active Problem List   Diagnosis Date Noted   Abnormal Pap smear of cervix 03/23/2024   Encounter for IUD insertion 03/18/2024   History of anemia 12/26/2023   Depression with anxiety 11/26/2022   Jerky body movements 02/26/2022   History of Roux-en-Y gastric bypass 08/08/2020   PCOS (polycystic ovarian syndrome) 08/08/2020   Syncope 07/25/2020   POTS (postural orthostatic  tachycardia syndrome) 05/03/2020   Asthma 03/23/2019    PCP: Jayne Vonn DEL, MD  REFERRING PROVIDER: Loreli Suzen BIRCH, CNM  REFERRING DIAG: [phone] (ICD-10-CM) - Mixed stress and urge urinary incontinence   THERAPY DIAG:  Muscle weakness (generalized)  Sciatica, right side  Other low back pain  Rationale for Evaluation and Treatment: Rehabilitation  ONSET DATE: 2025  SUBJECTIVE:  SUBJECTIVE STATEMENT: Patient states that she continues to have some increased pain when she sits.   Pt reports that has increased heart rate at times and is afraid to exercise in the gym. Her back pain has been increased to every day Tries to exercise at home.  Has BP drop and tachycardia and working with a cardiologist at [employer].  Bleeding subsided, but still peeing on herself, She just did at [facility] this past week.  Has had some tenderness with intercourse. Not nursing Back pain on right ( sacrum in the middle), pain shoots down into both legs, it is numb, pain subsides. Repositioning relieves it. Feels pressure. Standing increases pressure Dry needling helped last time    Fluid intake: a lot of water  PAIN:  Are you having pain? Yes- low back pain, down both legs NPRS scale: ranges from 3-7/10 Aggravating factors: some seated positions, standing and walking Relieving factors: meds  PRECAUTIONS: None  RED FLAGS: None   WEIGHT BEARING RESTRICTIONS: No  FALLS:  Has patient fallen in last 6 months? 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  PLOF: Independent  PATIENT GOALS: reduce leaking  PERTINENT HISTORY:  Gastric bypass 2020 Sexual abuse: No  BOWEL MOVEMENT: she had a gastric bypass- surgery in [facility] 2020, was 340 lbs, diabetic,  went to 140 lbs, some constipation Pain with bowel movement: Yes Type of bowel movement:Type (Bristol Stool Scale) 1-2 Fully empty rectum: No- sometimes not Leakage: No Pads: No Fiber supplement/laxative Yes at times  URINATION: Pain with urination: No Fully empty bladder: Yes: when she is full Stream: Strong Urgency: Yes - pees every 5 secs Frequency: yes Leakage: Urge to void, Coughing, Sneezing, and Laughing- afraid to lift and exercise in the gym Pads: Yes: panty liners-3   INTERCOURSE:  Ability to have vaginal penetration Yes  Pain with intercourse: Deep Penetration, gets sore, feels inflammation, not using lubricant  Dryness No Climax: yes Marinoff Scale: 0/3 Laxative:yes at times  PREGNANCY: Vaginal deliveries 3 Tearing Yes:   Episiotomy No C-section deliveries 0 Currently pregnant No  PROLAPSE: Pressure   OBJECTIVE:  Note: Objective measures were completed at Evaluation unless otherwise noted.   PATIENT SURVEYS:  Eval:  PFIQ-7: 58 06/15/24: Modified Oswestry: 18/50=36%   COGNITION: Overall cognitive status: Within functional limits for tasks assessed     SENSATION: Light touch: Appears intact  LUMBAR SPECIAL TESTS:  Stork standing: Positive for bilateral knee valgus- hip weakness    GAIT: Assistive device utilized: None Comments: within functional limitations   POSTURE: rounded shoulders, forward head, decreased lumbar lordosis, decreased thoracic kyphosis, and flexed trunk    LUMBARAROM/PROM: guarded d/t pain, pt with a lot of difficulty and pain with supine to sit transfer, prone on elbows relieved pain  A/PROM A/PROM  Eval % available  Flexion 50  Extension 90   LOWER EXTREMITY ROM: grossly within functional limitations    LOWER EXTREMITY MMT: bilateral hip weakness 4/5  PALPATION:   General: tight and tender right piriformis  and lumbar paraspinals bilateral   Pelvic Alignment: dips to right  Abdominal: low tone, diastasis  present                External Perineal Exam: mild dryness present, labia visible, decent mobility of clitoral hood                             Internal Pelvic  Floor: superficial- mild  tightness and restriction  of perineal scar present and bilateral bulbocavernosus, pt felt mild burning  Patient confirms identification and approves PT to assess internal pelvic floor and treatment Yes Patient confirms identification and approves PT to assess internal pelvic floor and treatment Yes No emotional/communication barriers or cognitive limitation. Patient is motivated to learn. Patient understands and agrees with treatment goals and plan. PT explains patient will be examined in standing, sitting, and lying down to see how their muscles and joints work. When they are ready, they will be asked to remove their underwear so PT can examine their perineum. The patient is also given the option of providing their own chaperone as one is not provided in our facility. The patient also has the right and is explained the right to defer or refuse any part of the evaluation or treatment including the internal exam. With the patient's consent, PT will use one gloved finger to gently assess the muscles of the pelvic floor, seeing how well it contracts and relaxes and if there is muscle symmetry. After, the patient will get dressed and PT and patient will discuss exam findings and plan of care. PT and patient discuss plan of care, schedule, attendance policy and HEP activities.     PELVIC MMT:   MMT eval  Vaginal 3/5 MMT, improved with transverse abdominis breath and with hip adduction with knees  Internal Anal Sphincter   External Anal Sphincter   Puborectalis   Diastasis Recti 1 finger at, above and below umbilicus  (Blank rows = not tested)        TONE:low   PROLAPSE: anterior and posterior vaginal laxity in hooklying   TODAY'S TREATMENT:                                                                                                                                DATE: 06/24/2024   NuStep: level 5x6 minutes-PT present to discuss progress Sit to stand with 5# kettle bell- exhale on exertion x10 Farmer's carry with 5# dumbbell performing high marching x10 on each side (cuing for core engagement) Seated hamstring stretch 2x20 sec bilat Seated piriformis stretch 2x20 sec bilat Education on log rolling technique for decreased back pain Supine hip adduction ball squeeze with cuing for pelvic contraction 5 sec hold x10 Hooklying hip abduction with yellow loop 2x10 Supine bent knee fall-outs x10   DATE: 06/15/24   NuStep: level 5x6 minutes-PT present to discuss progress Sit to stand with 5# kettle bell- exhale on exertion x10 Modified Oswestry 18/50=36%  Hamstring and figure 4 stretch: seated 2x20 seconds bil.  TA and pelvic activation with hip adduction into ball: 5 hold x10, hip abduction against band (blue loop) x10 Sidelying clam x10 bil each   DATE: 06/05/24   There act- review of progress, new symptoms, exercises Neurom- feedback for pelvic floor muscle exercises There ex- piriformis stretch, sciatic flossing, pelvic floor contractions, hip adduction with  ball with transverse abdominis breath with PF contraction   PATIENT EDUCATION:  Education details: relevant anatomy, expectations of PT, HEP. Exam findings Person educated: Patient Education method: Explanation, Demonstration, Tactile cues, Verbal cues, and Handouts Education comprehension: verbalized understanding, returned demonstration, verbal cues required, tactile cues required, and needs further education      HOME EXERCISE PROGRAM: Access Code: [facility] URL: [email or web address] Date: 06/24/2024 Prepared by: Jarrell Laming  Program Notes exhale on exertion  Exercises - Horizontal abd with TB with TRA breath  - 1 x daily - 7 x weekly - 3 sets - 10 reps - Seated Piriformis Stretch with Trunk Bend  - 1 x  daily - 7 x weekly - 3 sets - 10 reps - Supine 90/90 Sciatic Nerve Glide with Knee Flexion/Extension  - 1 x daily - 7 x weekly - 3 sets - 10 reps - Diaphragmatic Breathing to Reduce Intra-abdominal Pressure: Sit to Stand  - 1 x daily - 7 x weekly - 3 sets - 10 reps - Diaphragmatic Breathing to Reduce Intra-abdominal Pressure: Lifting a Basket  - 1 x daily - 7 x weekly - 3 sets - 10 reps - Rowing with Pelvic Floor Contraction  - 1 x daily - 7 x weekly - 3 sets - 10 reps - Seated Quick Flick Pelvic Floor Contractions  - 1 x daily - 7 x weekly - 3 sets - 10 reps - Pelvic Floor Contractions in Hooklying with Adduction  - 1 x daily - 7 x weekly - 3 sets - 10 reps  ASSESSMENT:  CLINICAL IMPRESSION: Ms Noguez presents to skilled PT stating that she is still continuing to have constant pain.  Patient able to progress with exercises during session with minimal cuing for technique and pacing.  Did require some cuing on stretching to not stretch too much to avoid pain, to instead, have a more gentle stretch.  Patient education on the log roll technique for supine to/from sit and the potential benefits for decreased pain.  Patient continues to have increased pain with supine to sit and with decreased muscle strength in upper body noted for the pushing up phase.  Patient continues to require skilled PT to progress towards goal related activities and decreased pain.   OBJECTIVE IMPAIRMENTS: decreased activity tolerance, decreased coordination, decreased endurance, difficulty walking, decreased strength, increased muscle spasms, impaired tone, improper body mechanics, and pain.   ACTIVITY LIMITATIONS: standing, sleeping, transfers, continence, toileting, and caring for others  PARTICIPATION LIMITATIONS: occupation  PERSONAL FACTORS: Time since onset of injury/illness/exacerbation are also affecting patient's functional outcome.   REHAB POTENTIAL: Good  CLINICAL DECISION MAKING: Evolving/moderate  complexity  EVALUATION COMPLEXITY: Moderate   GOALS: Goals reviewed with patient? Yes  SHORT TERM GOALS: Target date: 05/18/2024    Pt will be independent with HEP.   Baseline: Goal status: Met on 06/24/24  2.  Pt will be independent with the knack, urge suppression technique, and double voiding in order to improve bladder habits and decrease urinary incontinence.   Baseline: using this with movement and has had fewer major accidents (06/15/24) Goal status: in progress   3.  Pt will demonstrate increase in all impaired hip strength by 0.5 muscle grade in order to demonstrate improved lumbopelvic support and increase functional ability.   Baseline:  Goal status: in progress   4.  Pt will report reducing low back pain to max 3/10 with her HEP Baseline: 6/10 (06/15/24) Goal status: In progress     LONG TERM GOALS: Target  date: 10/20/2024  Pt will be independent with advanced HEP.   Baseline:  Goal status: Ongoing  2.  Pt will be able to get from supine to sit without pain Baseline:  Goal status: INITIAL  3.  Pt will report max 2/10 pain with all transfers to be able to work and care for her children without limitations Baseline:  Goal status: INITIAL  4.  Pt will soak 0 pads/ day to be able to participate in community activities Baseline:  Goal status: INITIAL  5.  Pt will be able to participate in an 45 mins exercise session without leaking Baseline:  Goal status: INITIAL  6.  Pt will be able to walk and transfer at work without leaking  Baseline:  Goal status: INITIAL  PLAN:  PT FREQUENCY: 1-2x/week  PT DURATION: 6 months  PLANNED INTERVENTIONS: 97110-Therapeutic exercises, 97530- Therapeutic activity, [id number]- Neuromuscular re-education, 97535- Self Care, [id number]- Manual therapy, 97032- Electrical stimulation (manual), Taping, Dry Needling, Joint mobilization, Joint manipulation, Spinal manipulation, Spinal mobilization, Scar mobilization, Cryotherapy, Moist heat,  and Biofeedback  PLAN FOR NEXT SESSION: internal, continue exercises, strengthen low back and hips, work on urge suppression strategies. Check on visits.     Jarrell Laming, PT, DPT 06/24/24, 9:05 AM  [facility] [address], [address] [place], [place] [address] Phone # [phone] Fax [phone]

## 2024-06-24 NOTE — Progress Notes (Signed)
   NURSE VISIT- VAGINITIS/STD/POC  SUBJECTIVE:  Vickie Sutton is a 23 y.o. [phone] GYN patientfemale here for a vaginal swab for STD screen.  She reports the following symptoms: abnormal bleeding: flow is light for several weeks. Denies abnormal vaginal bleeding, significant pelvic pain, fever, or UTI symptoms.  OBJECTIVE:  There were no vitals taken for this visit.  Appears well, in no apparent distress  ASSESSMENT: Vaginal swab for STD screen  PLAN: Self-collected vaginal probe for Gonorrhea, Chlamydia, Trichomonas, Bacterial Vaginosis, Yeast sent to lab Treatment: to be determined once results are received Follow-up as needed if symptoms persist/worsen, or new symptoms develop  Alan LITTIE Fischer  06/24/2024 3:34 PM

## 2024-06-29 ENCOUNTER — Ambulatory Visit: Payer: Self-pay | Admitting: Obstetrics & Gynecology

## 2024-06-29 DIAGNOSIS — B379 Candidiasis, unspecified: Secondary | ICD-10-CM

## 2024-06-29 LAB — CERVICOVAGINAL ANCILLARY ONLY
Bacterial Vaginitis (gardnerella): NEGATIVE
Candida Glabrata: NEGATIVE
Candida Vaginitis: POSITIVE — AB
Chlamydia: NEGATIVE
Comment: NEGATIVE
Comment: NEGATIVE
Comment: NEGATIVE
Comment: NEGATIVE
Comment: NEGATIVE
Comment: NORMAL
Neisseria Gonorrhea: NEGATIVE
Trichomonas: NEGATIVE

## 2024-06-29 MED ORDER — FLUCONAZOLE 150 MG PO TABS: ORAL_TABLET | ORAL | 1 refills | Status: DC

## 2024-07-03 NOTE — Therapy (Incomplete)
 OUTPATIENT PHYSICAL THERAPY FEMALE PELVIC TREATMENT   Patient Name: Vickie Sutton MRN: [id number] DOB:March 08, 2001, 23 y.o., female Today's Date: 07/03/2024  END OF SESSION:      Past Medical History:  Diagnosis Date   ADD (attention deficit disorder)    Anxiety    Asthma    Depression    Migraine    Panic attacks    Polycystic ovarian syndrome    POTS (postural orthostatic tachycardia syndrome)    Syncope    Neurocardiogenic suspected   Vasovagal syncope    Past Surgical History:  Procedure Laterality Date   ESOPHAGOGASTRODUODENOSCOPY     LAPAROTOMY N/A 09/07/2020   Procedure: EXPLORATORY LAPAROTOMY WITH EXTRACTION  OVARIAN CYST;  Surgeon: Jayne Vonn DEL, MD;  Location: [facility] OR;  Service: Gynecology;  Laterality: N/A;   OVARIAN CYST SURGERY Right    ROUX-EN-Y GASTRIC BYPASS     August 2020 - [facility]   TOOTH EXTRACTION N/A 12/12/2015   Procedure: EXTRACTION MOLARS - teeth one, sixteen, seventeen and thirty-two;  Surgeon: Glendia Primrose, DDS;  Location: [facility] OR;  Service: Oral Surgery;  Laterality: N/A;   Patient Active Problem List   Diagnosis Date Noted   Abnormal Pap smear of cervix 03/23/2024   Encounter for IUD insertion 03/18/2024   History of anemia 12/26/2023   Depression with anxiety 11/26/2022   Jerky body movements 02/26/2022   History of Roux-en-Y gastric bypass 08/08/2020   PCOS (polycystic ovarian syndrome) 08/08/2020   Syncope 07/25/2020   POTS (postural orthostatic tachycardia syndrome) 05/03/2020   Asthma 03/23/2019    PCP: Jayne Vonn DEL, MD  REFERRING PROVIDER: Loreli Suzen BIRCH, CNM  REFERRING DIAG: [phone] (ICD-10-CM) - Mixed stress and urge urinary incontinence   THERAPY DIAG:  No diagnosis found.  Rationale for Evaluation and Treatment: Rehabilitation  ONSET DATE: 2025  SUBJECTIVE:                                                                                                                                                                                            SUBJECTIVE STATEMENT: ***   Pt reports that has increased heart rate at times and is afraid to exercise in the gym. Her back pain has been increased to every day Tries to exercise at home.  Has BP drop and tachycardia and working with a cardiologist at [employer].  Bleeding subsided, but still peeing on herself, She just did at [facility] this past week.  Has had some tenderness with intercourse. Not nursing Back pain on right ( sacrum in the middle), pain shoots down into both legs, it is numb, pain subsides. Repositioning relieves it. Feels pressure.  Standing increases pressure Dry needling helped last time    Fluid intake: a lot of water  PAIN:  Are you having pain? Yes- low back pain, down both legs NPRS scale: ranges from 3-7/10 Aggravating factors: some seated positions, standing and walking Relieving factors: meds  PRECAUTIONS: None  RED FLAGS: None   WEIGHT BEARING RESTRICTIONS: No  FALLS:  Has patient fallen in last 6 months? 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  PLOF: Independent  PATIENT GOALS: reduce leaking  PERTINENT HISTORY:  Gastric bypass 2020 Sexual abuse: No  BOWEL MOVEMENT: she had a gastric bypass- surgery in [facility] 2020, was 340 lbs, diabetic, went to 140 lbs, some constipation Pain with bowel movement: Yes Type of bowel movement:Type (Bristol Stool Scale) 1-2 Fully empty rectum: No- sometimes not Leakage: No Pads: No Fiber supplement/laxative Yes at times  URINATION: Pain with urination: No Fully empty bladder: Yes: when she is full Stream: Strong Urgency: Yes - pees every 5 secs Frequency: yes Leakage: Urge to void, Coughing, Sneezing, and Laughing- afraid to lift and exercise in the gym Pads: Yes: panty liners-3   INTERCOURSE:  Ability to have vaginal penetration Yes  Pain with intercourse: Deep Penetration, gets sore, feels inflammation, not  using lubricant  Dryness No Climax: yes Marinoff Scale: 0/3 Laxative:yes at times  PREGNANCY: Vaginal deliveries 3 Tearing Yes:   Episiotomy No C-section deliveries 0 Currently pregnant No  PROLAPSE: Pressure   OBJECTIVE:  Note: Objective measures were completed at Evaluation unless otherwise noted.   PATIENT SURVEYS:  Eval:  PFIQ-7: 58 06/15/24: Modified Oswestry: 18/50=36%   COGNITION: Overall cognitive status: Within functional limits for tasks assessed     SENSATION: Light touch: Appears intact  LUMBAR SPECIAL TESTS:  Stork standing: Positive for bilateral knee valgus- hip weakness    GAIT: Assistive device utilized: None Comments: within functional limitations   POSTURE: rounded shoulders, forward head, decreased lumbar lordosis, decreased thoracic kyphosis, and flexed trunk    LUMBARAROM/PROM: guarded d/t pain, pt with a lot of difficulty and pain with supine to sit transfer, prone on elbows relieved pain  A/PROM A/PROM  Eval % available  Flexion 50  Extension 90   LOWER EXTREMITY ROM: grossly within functional limitations    LOWER EXTREMITY MMT: bilateral hip weakness 4/5  PALPATION:   General: tight and tender right piriformis  and lumbar paraspinals bilateral   Pelvic Alignment: dips to right  Abdominal: low tone, diastasis present                External Perineal Exam: mild dryness present, labia visible, decent mobility of clitoral hood                             Internal Pelvic Floor: superficial- mild  tightness and restriction  of perineal scar present and bilateral bulbocavernosus, pt felt mild burning  Patient confirms identification and approves PT to assess internal pelvic floor and treatment Yes Patient confirms identification and approves PT to assess internal pelvic floor and treatment Yes No emotional/communication barriers or cognitive limitation. Patient is motivated to learn. Patient understands and agrees with treatment  goals and plan. PT explains patient will be examined in standing, sitting, and lying down to see how their muscles and joints work. When they are ready, they will be asked to remove their underwear so PT can examine their perineum. The patient is also given  the option of providing their own chaperone as one is not provided in our facility. The patient also has the right and is explained the right to defer or refuse any part of the evaluation or treatment including the internal exam. With the patient's consent, PT will use one gloved finger to gently assess the muscles of the pelvic floor, seeing how well it contracts and relaxes and if there is muscle symmetry. After, the patient will get dressed and PT and patient will discuss exam findings and plan of care. PT and patient discuss plan of care, schedule, attendance policy and HEP activities.     PELVIC MMT:   MMT eval  Vaginal 3/5 MMT, improved with transverse abdominis breath and with hip adduction with knees  Internal Anal Sphincter   External Anal Sphincter   Puborectalis   Diastasis Recti 1 finger at, above and below umbilicus  (Blank rows = not tested)        TONE:low   PROLAPSE: anterior and posterior vaginal laxity in hooklying   TODAY'S TREATMENT:                                                                                                                               DATE: 07/06/2024   NuStep: level 5x6 minutes-PT present to discuss progress Sit to stand with 5# kettle bell- exhale on exertion x10 Farmer's carry with 5# dumbbell performing high marching x10 on each side (cuing for core engagement) Seated hamstring stretch 2x20 sec bilat Seated piriformis stretch 2x20 sec bilat Education on log rolling technique for decreased back pain Supine hip adduction ball squeeze with cuing for pelvic contraction 5 sec hold x10 Hooklying hip abduction with yellow loop 2x10 Supine bent knee fall-outs x10   DATE: 06/24/2024   NuStep:  level 5x6 minutes-PT present to discuss progress Sit to stand with 5# kettle bell- exhale on exertion x10 Farmer's carry with 5# dumbbell performing high marching x10 on each side (cuing for core engagement) Seated hamstring stretch 2x20 sec bilat Seated piriformis stretch 2x20 sec bilat Education on log rolling technique for decreased back pain Supine hip adduction ball squeeze with cuing for pelvic contraction 5 sec hold x10 Hooklying hip abduction with yellow loop 2x10 Supine bent knee fall-outs x10   DATE: 06/15/24   NuStep: level 5x6 minutes-PT present to discuss progress Sit to stand with 5# kettle bell- exhale on exertion x10 Modified Oswestry 18/50=36%  Hamstring and figure 4 stretch: seated 2x20 seconds bil.  TA and pelvic activation with hip adduction into ball: 5 hold x10, hip abduction against band (blue loop) x10 Sidelying clam x10 bil each   DATE: 06/05/24   There act- review of progress, new symptoms, exercises Neurom- feedback for pelvic floor muscle exercises There ex- piriformis stretch, sciatic flossing, pelvic floor contractions, hip adduction with ball with transverse abdominis breath with PF contraction   PATIENT EDUCATION:  Education details: relevant anatomy, expectations of PT, HEP. Exam findings  Person educated: Patient Education method: Explanation, Demonstration, Tactile cues, Verbal cues, and Handouts Education comprehension: verbalized understanding, returned demonstration, verbal cues required, tactile cues required, and needs further education      HOME EXERCISE PROGRAM: Access Code: [email or web address] URL: [email or web address] Date: 06/24/2024 Prepared by: Jarrell Laming  Program Notes exhale on exertion  Exercises - Horizontal abd with TB with TRA breath  - 1 x daily - 7 x weekly - 3 sets - 10 reps - Seated Piriformis Stretch with Trunk Bend  - 1 x daily - 7 x weekly - 3 sets - 10 reps - Supine 90/90 Sciatic Nerve Glide with Knee  Flexion/Extension  - 1 x daily - 7 x weekly - 3 sets - 10 reps - Diaphragmatic Breathing to Reduce Intra-abdominal Pressure: Sit to Stand  - 1 x daily - 7 x weekly - 3 sets - 10 reps - Diaphragmatic Breathing to Reduce Intra-abdominal Pressure: Lifting a Basket  - 1 x daily - 7 x weekly - 3 sets - 10 reps - Rowing with Pelvic Floor Contraction  - 1 x daily - 7 x weekly - 3 sets - 10 reps - Seated Quick Flick Pelvic Floor Contractions  - 1 x daily - 7 x weekly - 3 sets - 10 reps - Pelvic Floor Contractions in Hooklying with Adduction  - 1 x daily - 7 x weekly - 3 sets - 10 reps  ASSESSMENT:  CLINICAL IMPRESSION:   ***Ms Groseclose presents to skilled PT stating that she is still continuing to have constant pain.  Patient able to progress with exercises during session with minimal cuing for technique and pacing.  Did require some cuing on stretching to not stretch too much to avoid pain, to instead, have a more gentle stretch.  Patient education on the log roll technique for supine to/from sit and the potential benefits for decreased pain.  Patient continues to have increased pain with supine to sit and with decreased muscle strength in upper body noted for the pushing up phase.  Patient continues to require skilled PT to progress towards goal related activities and decreased pain.   OBJECTIVE IMPAIRMENTS: decreased activity tolerance, decreased coordination, decreased endurance, difficulty walking, decreased strength, increased muscle spasms, impaired tone, improper body mechanics, and pain.   ACTIVITY LIMITATIONS: standing, sleeping, transfers, continence, toileting, and caring for others  PARTICIPATION LIMITATIONS: occupation  PERSONAL FACTORS: Time since onset of injury/illness/exacerbation are also affecting patient's functional outcome.   REHAB POTENTIAL: Good  CLINICAL DECISION MAKING: Evolving/moderate complexity  EVALUATION COMPLEXITY: Moderate   GOALS: Goals reviewed with patient?  Yes  SHORT TERM GOALS: Target date: 05/18/2024    Pt will be independent with HEP.   Baseline: Goal status: Met on 06/24/24  2.  Pt will be independent with the knack, urge suppression technique, and double voiding in order to improve bladder habits and decrease urinary incontinence.   Baseline: using this with movement and has had fewer major accidents (06/15/24) Goal status: in progress   3.  Pt will demonstrate increase in all impaired hip strength by 0.5 muscle grade in order to demonstrate improved lumbopelvic support and increase functional ability.   Baseline:  Goal status: in progress   4.  Pt will report reducing low back pain to max 3/10 with her HEP Baseline: 6/10 (06/15/24) Goal status: In progress     LONG TERM GOALS: Target date: 10/20/2024  Pt will be independent with advanced HEP.   Baseline:  Goal status: Ongoing  2.  Pt  will be able to get from supine to sit without pain Baseline:  Goal status: INITIAL  3.  Pt will report max 2/10 pain with all transfers to be able to work and care for her children without limitations Baseline:  Goal status: INITIAL  4.  Pt will soak 0 pads/ day to be able to participate in community activities Baseline:  Goal status: INITIAL  5.  Pt will be able to participate in an 45 mins exercise session without leaking Baseline:  Goal status: INITIAL  6.  Pt will be able to walk and transfer at work without leaking  Baseline:  Goal status: INITIAL  PLAN:  PT FREQUENCY: 1-2x/week  PT DURATION: 6 months  PLANNED INTERVENTIONS: 97110-Therapeutic exercises, 97530- Therapeutic activity, [id number]- Neuromuscular re-education, 97535- Self Care, [id number]- Manual therapy, 97032- Electrical stimulation (manual), Taping, Dry Needling, Joint mobilization, Joint manipulation, Spinal manipulation, Spinal mobilization, Scar mobilization, Cryotherapy, Moist heat, and Biofeedback  PLAN FOR NEXT SESSION: internal, continue exercises, strengthen low  back and hips, work on urge suppression strategies. Check on visits.     Mliss Cummins, PT  07/03/24, 1:23 PM  [facility] [address] [place], [place] [address] Phone # [phone] Fax [phone]

## 2024-07-06 ENCOUNTER — Encounter: Admitting: Physical Therapy

## 2024-07-06 ENCOUNTER — Ambulatory Visit: Admitting: Physical Therapy

## 2024-07-08 ENCOUNTER — Ambulatory Visit

## 2024-07-08 DIAGNOSIS — R252 Cramp and spasm: Secondary | ICD-10-CM

## 2024-07-08 DIAGNOSIS — M5459 Other low back pain: Secondary | ICD-10-CM

## 2024-07-08 DIAGNOSIS — R262 Difficulty in walking, not elsewhere classified: Secondary | ICD-10-CM

## 2024-07-08 DIAGNOSIS — M5431 Sciatica, right side: Secondary | ICD-10-CM

## 2024-07-08 DIAGNOSIS — M6281 Muscle weakness (generalized): Secondary | ICD-10-CM | POA: Diagnosis not present

## 2024-07-08 NOTE — Therapy (Signed)
 OUTPATIENT PHYSICAL THERAPY FEMALE PELVIC TREATMENT   Patient Name: Vickie Sutton MRN: [id number] DOB:2001/01/11, 23 y.o., female Today's Date: 07/08/2024  END OF SESSION:  PT End of Session - 07/08/24 1109     Visit Number 5    Date for PT Re-Evaluation 10/20/24    [facility]ization Type [facility]  [facility]    Authorization Time Period RADMD Approved 12 visits- 04/20/2024-08/20/24    Authorization - Visit Number 5    Authorization - Number of Visits 12    PT Start Time 1110    PT Stop Time 1148    PT Time Calculation (min) 38 min    Activity Tolerance Patient tolerated treatment well    Behavior During Therapy WFL for tasks assessed/performed             Past Medical History:  Diagnosis Date   ADD (attention deficit disorder)    Anxiety    Asthma    Depression    Migraine    Panic attacks    Polycystic ovarian syndrome    POTS (postural orthostatic tachycardia syndrome)    Syncope    Neurocardiogenic suspected   Vasovagal syncope    Past Surgical History:  Procedure Laterality Date   ESOPHAGOGASTRODUODENOSCOPY     LAPAROTOMY N/A 09/07/2020   Procedure: EXPLORATORY LAPAROTOMY WITH EXTRACTION  OVARIAN CYST;  Surgeon: Jayne Vonn DEL, MD;  [facility];  Service: Gynecology;  Laterality: N/A;   OVARIAN CYST SURGERY Right    ROUX-EN-Y GASTRIC BYPASS     August 2020 - [facility]   TOOTH EXTRACTION N/A 12/12/2015   Procedure: EXTRACTION MOLARS - teeth one, sixteen, seventeen and thirty-two;  Surgeon: Glendia Primrose, DDS;  [facility];  Service: Oral Surgery;  Laterality: N/A;   Patient Active Problem List   Diagnosis Date Noted   Abnormal Pap smear of cervix 03/23/2024   Encounter for IUD insertion 03/18/2024   History of anemia 12/26/2023   Depression with anxiety 11/26/2022   Jerky body movements 02/26/2022   History of Roux-en-Y gastric bypass 08/08/2020   PCOS (polycystic ovarian syndrome) 08/08/2020   Syncope 07/25/2020   POTS (postural orthostatic  tachycardia syndrome) 05/03/2020   Asthma 03/23/2019    PCP: Jayne Vonn DEL, MD  REFERRING PROVIDER: Loreli Suzen BIRCH, CNM  REFERRING DIAG: [id number] (ICD-10-CM) - Mixed stress and urge urinary incontinence   THERAPY DIAG:  Muscle weakness (generalized)  Sciatica, right side  Difficulty in walking, not elsewhere classified  Other low back pain  Cramp and spasm  Rationale for Evaluation and Treatment: Rehabilitation  ONSET DATE: 2025  SUBJECTIVE:  SUBJECTIVE STATEMENT: Patient states she was having continued pain this morning.  She rates this pain at 5/10.  She explains that when she woke up and tried to do the log roll to get up this morning, she felt like she couldn't quite roll over.  Like I was stuck.    Pt reports that has increased heart rate at times and is afraid to exercise in the gym. Her back pain has been increased to every day Tries to exercise at home.  Has BP drop and tachycardia and working with a cardiologist at [employer].  Bleeding subsided, but still peeing on herself, She just did at [facility] this past week.  Has had some tenderness with intercourse. Not nursing Back pain on right ( sacrum in the middle), pain shoots down into both legs, it is numb, pain subsides. Repositioning relieves it. Feels pressure. Standing increases pressure Dry needling helped last time    Fluid intake: a lot of water  PAIN:  07/08/24 Are you having pain? Yes- low back pain, down both legs NPRS scale: ranges from 5/10 Aggravating factors: some seated positions, standing and walking Relieving factors: meds  PRECAUTIONS: None  RED FLAGS: None   WEIGHT BEARING RESTRICTIONS: No  FALLS:  Has patient fallen in last 6 months? 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  PLOF: Independent  PATIENT GOALS: reduce leaking  PERTINENT HISTORY:  Gastric bypass 2020 Sexual abuse: No  BOWEL MOVEMENT: she had a gastric bypass- surgery in [facility] 2020, was 340 lbs, diabetic, went to 140 lbs, some constipation Pain with bowel movement: Yes Type of bowel movement:Type (Bristol Stool Scale) 1-2 Fully empty rectum: No- sometimes not Leakage: No Pads: No Fiber supplement/laxative Yes at times  URINATION: Pain with urination: No Fully empty bladder: Yes: when she is full Stream: Strong Urgency: Yes - pees every 5 secs Frequency: yes Leakage: Urge to void, Coughing, Sneezing, and Laughing- afraid to lift and exercise in the gym Pads: Yes: panty liners-3   INTERCOURSE:  Ability to have vaginal penetration Yes  Pain with intercourse: Deep Penetration, gets sore, feels inflammation, not using lubricant  Dryness No Climax: yes Marinoff Scale: 0/3 Laxative:yes at times  PREGNANCY: Vaginal deliveries 3 Tearing Yes:   Episiotomy No C-section deliveries 0 Currently pregnant No  PROLAPSE: Pressure   OBJECTIVE:  Note: Objective measures were completed at Evaluation unless otherwise noted.   PATIENT SURVEYS:  Eval:  PFIQ-7: 58 06/15/24: Modified Oswestry: 18/50=36%   COGNITION: Overall cognitive status: Within functional limits for tasks assessed     SENSATION: Light touch: Appears intact  LUMBAR SPECIAL TESTS:  Stork standing: Positive for bilateral knee valgus- hip weakness    GAIT: Assistive device utilized: None Comments: within functional limitations   POSTURE: rounded shoulders, forward head, decreased lumbar lordosis, decreased thoracic kyphosis, and flexed trunk    LUMBARAROM/PROM: guarded d/t pain, pt with a lot of difficulty and pain with supine to sit transfer, prone on elbows relieved pain  A/PROM A/PROM  Eval % available  Flexion 50  Extension 90   LOWER EXTREMITY ROM: grossly within  functional limitations    LOWER EXTREMITY MMT: bilateral hip weakness 4/5  PALPATION:   General: tight and tender right piriformis  and lumbar paraspinals bilateral   Pelvic Alignment: dips to right  Abdominal: low tone, diastasis present                External Perineal Exam: mild dryness  present, labia visible, decent mobility of clitoral hood                             Internal Pelvic Floor: superficial- mild  tightness and restriction  of perineal scar present and bilateral bulbocavernosus, pt felt mild burning  Patient confirms identification and approves PT to assess internal pelvic floor and treatment Yes Patient confirms identification and approves PT to assess internal pelvic floor and treatment Yes No emotional/communication barriers or cognitive limitation. Patient is motivated to learn. Patient understands and agrees with treatment goals and plan. PT explains patient will be examined in standing, sitting, and lying down to see how their muscles and joints work. When they are ready, they will be asked to remove their underwear so PT can examine their perineum. The patient is also given the option of providing their own chaperone as one is not provided in our facility. The patient also has the right and is explained the right to defer or refuse any part of the evaluation or treatment including the internal exam. With the patient's consent, PT will use one gloved finger to gently assess the muscles of the pelvic floor, seeing how well it contracts and relaxes and if there is muscle symmetry. After, the patient will get dressed and PT and patient will discuss exam findings and plan of care. PT and patient discuss plan of care, schedule, attendance policy and HEP activities.     PELVIC MMT:   MMT eval  Vaginal 3/5 MMT, improved with transverse abdominis breath and with hip adduction with knees  Internal Anal Sphincter   External Anal Sphincter   Puborectalis   Diastasis Recti 1  finger at, above and below umbilicus  (Blank rows = not tested)        TONE:low   PROLAPSE: anterior and posterior vaginal laxity in hooklying   TODAY'S TREATMENT:                                                                                                                              DATE: 07/08/2024  (8 min late) NuStep: level 5x6 minutes-PT present to discuss progress Standing hamstring stretch 3 x 30 sec Standing quad stretch 3 x 30 sec Seated piriformis stretch 2x20 sec bilat Education on proper body mechanics and lifting for caring for her 3 children under 22 years old.  Suggested foot stool for when changing diapers and demonstrated safe positions for .   Mckenzie progression: Prone lying x 2 min, Prone on elbows x 2 min, prone press ups x 10 Ice to lumbar spine in hook lying x 10 min  DATE: 07/06/2024   NuStep: level 5x6 minutes-PT present to discuss progress Sit to stand with 5# kettle bell- exhale on exertion x10 Farmer's carry with 5# dumbbell performing high marching x10 on each side (cuing for core engagement) Seated hamstring stretch 2x20 sec bilat Seated piriformis stretch 2x20  sec bilat Education on log rolling technique for decreased back pain Supine hip adduction ball squeeze with cuing for pelvic contraction 5 sec hold x10 Hooklying hip abduction with yellow loop 2x10 Supine bent knee fall-outs x10   DATE: 06/24/2024   NuStep: level 5x6 minutes-PT present to discuss progress Sit to stand with 5# kettle bell- exhale on exertion x10 Farmer's carry with 5# dumbbell performing high marching x10 on each side (cuing for core engagement) Seated hamstring stretch 2x20 sec bilat Seated piriformis stretch 2x20 sec bilat Education on log rolling technique for decreased back pain Supine hip adduction ball squeeze with cuing for pelvic contraction 5 sec hold x10 Hooklying hip abduction with yellow loop 2x10 Supine bent knee fall-outs x10   DATE: 06/15/24   NuStep:  level 5x6 minutes-PT present to discuss progress Sit to stand with 5# kettle bell- exhale on exertion x10 Modified Oswestry 18/50=36%  Hamstring and figure 4 stretch: seated 2x20 seconds bil.  TA and pelvic activation with hip adduction into ball: 5 hold x10, hip abduction against band (blue loop) x10 Sidelying clam x10 bil each   DATE: 06/05/24   There act- review of progress, new symptoms, exercises Neurom- feedback for pelvic floor muscle exercises There ex- piriformis stretch, sciatic flossing, pelvic floor contractions, hip adduction with ball with transverse abdominis breath with PF contraction   PATIENT EDUCATION:  Education details: relevant anatomy, expectations of PT, HEP. Exam findings Person educated: Patient Education method: Explanation, Demonstration, Tactile cues, Verbal cues, and Handouts Education comprehension: verbalized understanding, returned demonstration, verbal cues required, tactile cues required, and needs further education      HOME EXERCISE PROGRAM: Access Code: [facility] URL: [email or web address] Date: 07/08/2024 Prepared by: Delon Haddock  Program Notes exhale on exertion  Exercises - Horizontal abd with TB with TRA breath  - 1 x daily - 7 x weekly - 3 sets - 10 reps - Seated Piriformis Stretch with Trunk Bend  - 1 x daily - 7 x weekly - 3 sets - 10 reps - Supine 90/90 Sciatic Nerve Glide with Knee Flexion/Extension  - 1 x daily - 7 x weekly - 3 sets - 10 reps - Diaphragmatic Breathing to Reduce Intra-abdominal Pressure: Sit to Stand  - 1 x daily - 7 x weekly - 3 sets - 10 reps - Diaphragmatic Breathing to Reduce Intra-abdominal Pressure: Lifting a Basket  - 1 x daily - 7 x weekly - 3 sets - 10 reps - Rowing with Pelvic Floor Contraction  - 1 x daily - 7 x weekly - 3 sets - 10 reps - Seated Quick Flick Pelvic Floor Contractions  - 1 x daily - 7 x weekly - 3 sets - 10 reps - Pelvic Floor Contractions in Hooklying with Adduction  -  1 x daily - 7 x weekly - 3 sets - 10 reps - Standing Hamstring Stretch on Chair  - 1 x daily - 7 x weekly - 1 sets - 3 reps - 30 sec hold - Standing Quad Stretch with Table and Chair Support  - 1 x daily - 7 x weekly - 1 sets - 3 reps - 30 sec hold - Lying Prone  - 1 x daily - 7 x weekly - 1 sets - 1 reps - 2 min hold - Static Prone on Elbows  - 1 x daily - 7 x weekly - 1 sets - 1 reps - 2 min hold - Prone Press Up  - 1 x daily - 7 x weekly -  1 sets - 10 reps - 2-3 sec hold  ASSESSMENT:  CLINICAL IMPRESSION:   Vickie Sutton responded well to todays treatment approach.  We added standing option for hamstring and quad stretching.  Time spent educating on safe body mechanics and posture while caring for her 3 kids.  She was very receptive to instruction.  She admits difficulty getting opportunities to do her HEP due to caring for her 3 kids under 23 years old.  She should continue to do well.   Patient continues to require skilled PT to progress towards goal related activities and decreased pain.   OBJECTIVE IMPAIRMENTS: decreased activity tolerance, decreased coordination, decreased endurance, difficulty walking, decreased strength, increased muscle spasms, impaired tone, improper body mechanics, and pain.   ACTIVITY LIMITATIONS: standing, sleeping, transfers, continence, toileting, and caring for others  PARTICIPATION LIMITATIONS: occupation  PERSONAL FACTORS: Time since onset of injury/illness/exacerbation are also affecting patient's functional outcome.   REHAB POTENTIAL: Good  CLINICAL DECISION MAKING: Evolving/moderate complexity  EVALUATION COMPLEXITY: Moderate   GOALS: Goals reviewed with patient? Yes  SHORT TERM GOALS: Target date: 05/18/2024    Pt will be independent with HEP.   Baseline: Goal status: Met on 06/24/24  2.  Pt will be independent with the knack, urge suppression technique, and double voiding in order to improve bladder habits and decrease urinary incontinence.    Baseline: using this with movement and has had fewer major accidents (06/15/24) Goal status: in progress   3.  Pt will demonstrate increase in all impaired hip strength by 0.5 muscle grade in order to demonstrate improved lumbopelvic support and increase functional ability.   Baseline:  Goal status: in progress   4.  Pt will report reducing low back pain to max 3/10 with her HEP Baseline: 6/10 (06/15/24) Goal status: In progress     LONG TERM GOALS: Target date: 10/20/2024  Pt will be independent with advanced HEP.   Baseline:  Goal status: Ongoing  2.  Pt will be able to get from supine to sit without pain Baseline:  Goal status: INITIAL  3.  Pt will report max 2/10 pain with all transfers to be able to work and care for her children without limitations Baseline:  Goal status: INITIAL  4.  Pt will soak 0 pads/ day to be able to participate in community activities Baseline:  Goal status: INITIAL  5.  Pt will be able to participate in an 45 mins exercise session without leaking Baseline:  Goal status: INITIAL  6.  Pt will be able to walk and transfer at work without leaking  Baseline:  Goal status: INITIAL  PLAN:  PT FREQUENCY: 1-2x/week  PT DURATION: 6 months  PLANNED INTERVENTIONS: 97110-Therapeutic exercises, 97530- Therapeutic activity, [id number]- Neuromuscular re-education, 97535- Self Care, 02859- Manual therapy, 97032- Electrical stimulation (manual), Taping, Dry Needling, Joint mobilization, Joint manipulation, Spinal manipulation, Spinal mobilization, Scar mobilization, Cryotherapy, Moist heat, and Biofeedback  PLAN FOR NEXT SESSION: Nustep, LE flexibility and core strengthening,  internal, continue exercises, strengthen low back and hips, work on urge suppression strategies. Check on visits.     Delon B. Seneca Hoback, PT 07/08/24 11:05 PM [facility] [address], [address] [place], [place] [address] Phone # [phone] Fax  [phone]

## 2024-07-13 ENCOUNTER — Encounter

## 2024-07-13 ENCOUNTER — Ambulatory Visit

## 2024-07-15 ENCOUNTER — Ambulatory Visit

## 2024-09-08 ENCOUNTER — Encounter: Payer: Self-pay | Admitting: Plastic Surgery

## 2024-09-08 ENCOUNTER — Ambulatory Visit (INDEPENDENT_AMBULATORY_CARE_PROVIDER_SITE_OTHER): Admitting: Plastic Surgery

## 2024-09-08 VITALS — BP 95/62 | HR 64 | Ht 64.0 in | Wt 146.6 lb

## 2024-09-08 DIAGNOSIS — M793 Panniculitis, unspecified: Secondary | ICD-10-CM | POA: Diagnosis not present

## 2024-09-08 DIAGNOSIS — M542 Cervicalgia: Secondary | ICD-10-CM | POA: Diagnosis not present

## 2024-09-08 DIAGNOSIS — Z9884 Bariatric surgery status: Secondary | ICD-10-CM | POA: Diagnosis not present

## 2024-09-08 DIAGNOSIS — M549 Dorsalgia, unspecified: Secondary | ICD-10-CM | POA: Insufficient documentation

## 2024-09-08 DIAGNOSIS — M546 Pain in thoracic spine: Secondary | ICD-10-CM

## 2024-09-08 DIAGNOSIS — G8929 Other chronic pain: Secondary | ICD-10-CM

## 2024-09-08 NOTE — Progress Notes (Signed)
 Patient ID: Vickie Sutton, female    DOB: [birth year], 23 y.o.   MRN: [id number]   Chief Complaint  Patient presents with   Advice Only    The patient is a lovely 23 year old female here for evaluation of her abdomen.  She is 5 feet 4 inches tall and 146 pounds.  She has been really diligent about losing weight over the past 5 years.  She had a gastric bypass and went from around 350 pounds to her current weight.  She has 3 children under the age of 3 years.  No sign of a hernia.  She is at the weight that she will most likely maintain.  He complains of back and neck pain that may be related to the pannus that hangs.  She has some anxiety, migraines and vasovagal response.  She is otherwise in good health.  She has also had her wisdom teeth out without any difficulty.  Her youngest child is 31 months old.    Review of Systems  Constitutional:  Positive for activity change. Negative for appetite change.  HENT: Negative.    Eyes: Negative.   Respiratory: Negative.    Cardiovascular: Negative.   Gastrointestinal: Negative.   Endocrine: Negative.   Genitourinary: Negative.   Musculoskeletal: Negative.     Past Medical History:  Diagnosis Date   ADD (attention deficit disorder)    Anxiety    Asthma    Depression    Migraine    Panic attacks    Polycystic ovarian syndrome    POTS (postural orthostatic tachycardia syndrome)    Syncope    Neurocardiogenic suspected   Vasovagal syncope     Past Surgical History:  Procedure Laterality Date   ESOPHAGOGASTRODUODENOSCOPY     LAPAROTOMY N/A 09/07/2020   Procedure: EXPLORATORY LAPAROTOMY WITH EXTRACTION  OVARIAN CYST;  Surgeon: Jayne Vonn DEL, MD;  Location: [facility];  Service: Gynecology;  Laterality: N/A;   OVARIAN CYST SURGERY Right    ROUX-EN-Y GASTRIC BYPASS     August 2020 - [facility]   TOOTH EXTRACTION N/A 12/12/2015   Procedure: EXTRACTION MOLARS - teeth one, sixteen, seventeen and thirty-two;  Surgeon: Glendia Primrose, DDS;   Location: [facility];  Service: Oral Surgery;  Laterality: N/A;      Current Outpatient Medications:    acetaminophen  (TYLENOL ) 325 MG tablet, Take 2 tablets (650 mg total) by mouth every 4 (four) hours as needed (for pain scale < 4)., Disp: , Rfl:    albuterol  (VENTOLIN  HFA) 108 (90 Base) MCG/ACT inhaler, Inhale 1-2 puffs into the lungs every 4 (four) hours as needed for wheezing or shortness of breath., Disp: 8.5 g, Rfl: 4   Ferrous Sulfate  (IRON  PO), Take by mouth., Disp: , Rfl:    fluconazole  (DIFLUCAN ) 150 MG tablet, Take 1 now and 1 in 3 days, Disp: 2 tablet, Rfl: 1   metroNIDAZOLE  (FLAGYL ) 500 MG tablet, Take 1 tablet (500 mg total) by mouth 2 (two) times daily., Disp: 14 tablet, Rfl: 0   Multiple Vitamin (MULTIVITAMIN) tablet, Take 1 tablet by mouth daily., Disp: , Rfl:    sulfamethoxazole -trimethoprim  (BACTRIM  DS) 800-160 MG tablet, Take 1 tablet by mouth 2 (two) times daily. Take 1 bid, Disp: 14 tablet, Rfl: 0   valACYclovir  (VALTREX ) 1000 MG tablet, Take 2 tablets (2,000 mg total) by mouth 2 (two) times daily. X 1 day, Disp: 4 tablet, Rfl: 3   witch hazel-glycerin  (TUCKS) pad, Apply 1 Application topically as needed for hemorrhoids., Disp: 40  each, Rfl: 12   cyclobenzaprine  (FLEXERIL ) 10 MG tablet, Take 1 tablet (10 mg total) by mouth 3 (three) times daily as needed for muscle spasms. (Patient not taking: Reported on 09/08/2024), Disp: 30 tablet, Rfl: 0   megestrol  (MEGACE ) 40 MG tablet, 3x5d, 2x5d, then 1 daily to help control vaginal bleeding. Stop taking 1 week after bleeding stops. (Patient not taking: Reported on 09/08/2024), Disp: 45 tablet, Rfl: 1   Prenatal Vit-Fe Fumarate-FA (PRENATAL VITAMIN PO), Take by mouth. (Patient not taking: Reported on 09/08/2024), Disp: , Rfl:    sertraline  (ZOLOFT ) 50 MG tablet, Take 1 tablet (50 mg total) by mouth daily. (Patient not taking: Reported on 09/08/2024), Disp: 30 tablet, Rfl: 12   Objective:   Vitals:   09/08/24 1249  BP: 95/62  Pulse: 64   SpO2: 100%    Physical Exam Vitals reviewed.  Constitutional:      Appearance: Normal appearance.  HENT:     Head: Atraumatic.  Cardiovascular:     Rate and Rhythm: Normal rate.     Pulses: Normal pulses.  Pulmonary:     Effort: Pulmonary effort is normal.  Abdominal:     General: There is no distension.     Palpations: Abdomen is soft.     Tenderness: There is no abdominal tenderness.  Skin:    General: Skin is warm.     Capillary Refill: Capillary refill takes less than 2 seconds.  Neurological:     Mental Status: She is alert and oriented to person, place, and time.  Psychiatric:        Mood and Affect: Mood normal.        Behavior: Behavior normal.        Thought Content: Thought content normal.        Judgment: Judgment normal.     Assessment & Plan:  Panniculitis  Chronic bilateral thoracic back pain  The biggest situation with this patient is that she may not be finished having children.  With that in mind she is going to think about the timing for the surgery.  She should wait to at least a year after the last child before having the surgery.  She is a good candidate and I think she would have a very nice result.  Timing is important and I want to do what is best for her and not create any issues in the future for her goals.  Pictures were obtained of the patient and placed in the chart with the patient's or guardian's permission.   Vickie Sutton Vickie Bohall, DO

## 2024-10-07 ENCOUNTER — Ambulatory Visit (INDEPENDENT_AMBULATORY_CARE_PROVIDER_SITE_OTHER): Admitting: Women's Health

## 2024-10-07 ENCOUNTER — Other Ambulatory Visit (HOSPITAL_COMMUNITY)
Admission: RE | Admit: 2024-10-07 | Discharge: 2024-10-07 | Disposition: A | Discharge status: Home / Self Care | Source: Ambulatory Visit | Attending: Women's Health | Admitting: Women's Health

## 2024-10-07 ENCOUNTER — Encounter: Payer: Self-pay | Admitting: Women's Health

## 2024-10-07 VITALS — BP 99/66 | Ht 64.0 in | Wt 147.4 lb

## 2024-10-07 DIAGNOSIS — N898 Other specified noninflammatory disorders of vagina: Secondary | ICD-10-CM | POA: Diagnosis present

## 2024-10-07 DIAGNOSIS — N764 Abscess of vulva: Secondary | ICD-10-CM

## 2024-10-07 MED ORDER — SULFAMETHOXAZOLE-TRIMETHOPRIM 800-160 MG PO TABS: 1.0000 | ORAL_TABLET | Freq: Two times a day (BID) | ORAL | 0 refills | Status: AC

## 2024-10-07 NOTE — Patient Instructions (Signed)
 Incision and Drainage, Care After After incision and drainage, it is common to have: Pain or discomfort around the incision site. Blood, fluid, or pus (drainage) from the incision. Redness and firm skin around the incision site. Follow these instructions at home: Medicines Take over-the-counter and prescription medicines only as told by your health care provider. If you were prescribed antibiotics, take them as told by your provider. Do not stop using the antibiotic even if you start to feel better. Do not apply creams, ointments, or liquids unless you have been told to by your provider. Wound care Follow instructions from your provider about how to take care of your wound. Make sure you: Wash your hands with soap and water for at least 20 seconds before and after you change your bandage (dressing). If soap and water are not available, use hand sanitizer. Change your dressing and any packing as told by your provider. If the dressing is dry or stuck when you try to remove it, moisten or wet it with saline or water. This will help you remove it without harming your skin or tissues. If your wound is packed, leave it in place until your provider tells you to remove it. To remove it, moisten or wet the packing with saline or water. Leave stitches (sutures), skin glue, or tape strips in place. These skin closures may need to stay in place for 2 weeks or longer. If tape strip edges start to loosen and curl up, you may trim the loose edges. Do not remove tape strips completely unless your provider tells you to do that. Check your wound every day for signs of infection. Check for: More redness, swelling, or pain. More fluid or blood. Warmth. Pus or a bad smell. If you were sent home with a drain tube in place, follow instructions from your provider about: How to empty it. How to care for it at home. Be careful when you get rid of used dressings, wound packing, or drainage. Activity Rest the  affected area. Return to your normal activities as told by your provider. Ask your provider what activities are safe for you. General instructions Do not use any products that contain nicotine or tobacco. These products include cigarettes, chewing tobacco, and vaping devices, such as e-cigarettes. These can delay incision healing after surgery. If you need help quitting, ask your provider. Do not take baths, swim, or use a hot tub until your provider approves. Ask your provider if you may take showers. You may only be allowed to take sponge baths. The incision will keep draining. It is normal to have some clear or slightly bloody drainage. The amount of drainage should go down each day. Keep all follow-up visits. Your provider will need to make sure that your incision is healing well and that there are no problems. Your health care provider may give you more instructions. Make sure you know what you can and cannot do Contact a health care provider if: Your cyst or abscess comes back. You have any signs of infection. You notice red streaks that spread away from the incision site. You have a fever or chills. Get help right away if: You have severe pain or bleeding. You become short of breath. You have chest pain. You have signs of a severe infection. You may notice changes in your incision area, such as: Swelling that makes the skin feel hard. Numbness or tingling. Sudden increase in redness. Your skin color may change from red to purple, and then to dark  spots. Blisters, ulcers, or splitting of the skin. These symptoms may be an emergency. Get help right away. Call 911. Do not wait to see if the symptoms will go away. Do not drive yourself to the hospital. This information is not intended to replace advice given to you by your health care provider. Make sure you discuss any questions you have with your health care provider. Document Revised: 07/30/2022 Document Reviewed: 07/30/2022 Elsevier  Patient Education  2024 [employer].

## 2024-10-07 NOTE — Progress Notes (Signed)
 GYN VISIT Patient name: Vickie Sutton MRN [id number]  Date of birth: [birth year] Chief Complaint:   Abscess  History of Present Illness:   Vickie Sutton is a 23 y.o. [phone]  female being seen today for boil Rt vulva x 2d, worsening, hard to walk/sit/anything. Out of work yesterday. Vaginal d/c, no itching/odor/irritation-wants CV swab.  No LMP recorded. (Menstrual status: IUD). The current method of family planning is IUD.  Last pap 02/27/24. Results were: LSIL w/ HRHPV not done     01/27/2024    1:31 PM 01/07/2024    8:36 AM 12/31/2023    8:37 AM 11/08/2023    9:10 AM 07/24/2023   11:08 AM  Depression screen PHQ 2/9  Decreased Interest 0 0 0 0 0  Down, Depressed, Hopeless 0 0 0 0 0  PHQ - 2 Score 0 0 0 0 0  Altered sleeping 0   1 0  Tired, decreased energy 2   3 1   Change in appetite 0   0 0  Feeling bad or failure about yourself  0   0 0  Trouble concentrating 0   0 0  Moving slowly or fidgety/restless 1   0 0  Suicidal thoughts 0   0 0  PHQ-9 Score 3   4 1         11/08/2023    9:11 AM 07/24/2023   11:08 AM 01/11/2023    9:48 AM 09/07/2022    9:06 AM  GAD 7 : Generalized Anxiety Score  Nervous, Anxious, on Edge 1 0 1 1  Control/stop worrying 0 0 0 1  Worry too much - different things 0 0 0 1  Trouble relaxing 1 0 0 0  Restless 0 0 0 0  Easily annoyed or irritable 1 1 1 1   Afraid - awful might happen 0 0 0 0  Total GAD 7 Score 3 1 2 4      Review of Systems:   Pertinent items are noted in HPI Denies fever/chills, dizziness, headaches, visual disturbances, fatigue, shortness of breath, chest pain, abdominal pain, vomiting, abnormal vaginal discharge/itching/odor/irritation, problems with periods, bowel movements, urination, or intercourse unless otherwise stated above.  Pertinent History Reviewed:  Reviewed past medical,surgical, social, obstetrical and family history.  Reviewed problem list, medications and allergies. Physical Assessment:   Vitals:    10/07/24 1419  BP: 99/66  Weight: 147 lb 6.4 oz (66.9 kg)  Height: 5' 4 (1.626 m)  Body mass index is 25.3 kg/m.       Physical Examination:   General appearance: alert, well appearing, and in no distress  Mental status: alert, oriented to person, place, and time  Skin: warm & dry   Cardiovascular: normal heart rate noted  Respiratory: normal respiratory effort, no distress  Abdomen: soft, non-tender   Pelvic: blind CV swab collected,  large fluctuant boil w/ head lower Lt labia majora  Extremities: no edema   Chaperone: Alan Fischer  Diagnosis: abscess - Location: Lt vulva/labia majora Procedure: Incision & drainage Informed consent obtained Anesthesia: 2cc lidocaine  The area was prepared and draped in a standard fashion. The lesion drained pus and blood. The patient tolerated the procedure well. The patient was instructed on post-op care.   No results found for this or any previous visit (from the past 24 hours).  Assessment & Plan:  1) I&D large boil/abscess Lt labia majora> rx septra  ds, f/u Mon for recheck. If worsening/not improving go to ED  2) Vaginal d/c> CV swab  Meds:  Meds ordered this encounter  Medications   sulfamethoxazole -trimethoprim  (BACTRIM  DS) 800-160 MG tablet    Sig: Take 1 tablet by mouth 2 (two) times daily. X 7 days    Dispense:  14 tablet    Refill:  0    No orders of the defined types were placed in this encounter.   Return for monday f/u boil in person; print work note please.  [name] CNM, [facility] 10/07/2024 2:57 PM

## 2024-10-08 LAB — CERVICOVAGINAL ANCILLARY ONLY
Bacterial Vaginitis (gardnerella): NEGATIVE
Candida Glabrata: NEGATIVE
Candida Vaginitis: NEGATIVE
Chlamydia: NEGATIVE
Comment: NEGATIVE
Comment: NEGATIVE
Comment: NEGATIVE
Comment: NEGATIVE
Comment: NEGATIVE
Comment: NORMAL
Neisseria Gonorrhea: NEGATIVE
Trichomonas: NEGATIVE

## 2024-10-12 ENCOUNTER — Ambulatory Visit: Payer: Self-pay | Admitting: Women's Health

## 2024-10-13 ENCOUNTER — Encounter: Payer: Self-pay | Admitting: Women's Health

## 2024-10-13 ENCOUNTER — Ambulatory Visit: Admitting: Women's Health

## 2024-10-13 VITALS — BP 106/69 | HR 65 | Ht 64.0 in | Wt 145.4 lb

## 2024-10-13 DIAGNOSIS — N764 Abscess of vulva: Secondary | ICD-10-CM

## 2024-10-13 NOTE — Progress Notes (Signed)
   GYN VISIT Patient name: Vickie Sutton MRN [id number]  Date of birth: 13-Sep-2001 Chief Complaint:   Follow-up (Boil, check iud)  History of Present Illness:   Vickie Sutton is a 23 y.o. [phone] female being seen today for f/u after I&D of vulvar boil on 10/15, rx'd septra  ds at that visit. Much better. Finishes antibiotics tomorrow.     No LMP recorded. (Menstrual status: IUD). The current method of family planning is IUD.  Last pap 02/27/24. Results were: LSIL w/ HRHPV not done     01/27/2024    1:31 PM 01/07/2024    8:36 AM 12/31/2023    8:37 AM 11/08/2023    9:10 AM 07/24/2023   11:08 AM  Depression screen PHQ 2/9  Decreased Interest 0 0 0 0 0  Down, Depressed, Hopeless 0 0 0 0 0  PHQ - 2 Score 0 0 0 0 0  Altered sleeping 0   1 0  Tired, decreased energy 2   3 1   Change in appetite 0   0 0  Feeling bad or failure about yourself  0   0 0  Trouble concentrating 0   0 0  Moving slowly or fidgety/restless 1   0 0  Suicidal thoughts 0   0 0  PHQ-9 Score 3   4 1         11/08/2023    9:11 AM 07/24/2023   11:08 AM 01/11/2023    9:48 AM 09/07/2022    9:06 AM  GAD 7 : Generalized Anxiety Score  Nervous, Anxious, on Edge 1 0 1 1  Control/stop worrying 0 0 0 1  Worry too much - different things 0 0 0 1  Trouble relaxing 1 0 0 0  Restless 0 0 0 0  Easily annoyed or irritable 1 1 1 1   Afraid - awful might happen 0 0 0 0  Total GAD 7 Score 3 1 2 4      Review of Systems:   Pertinent items are noted in HPI Denies fever/chills, dizziness, headaches, visual disturbances, fatigue, shortness of breath, chest pain, abdominal pain, vomiting, abnormal vaginal discharge/itching/odor/irritation, problems with periods, bowel movements, urination, or intercourse unless otherwise stated above.  Pertinent History Reviewed:  Reviewed past medical,surgical, social, obstetrical and family history.  Reviewed problem list, medications and allergies. Physical Assessment:   Vitals:    10/13/24 1148  BP: 106/69  Pulse: 65  Weight: 145 lb 6.4 oz (66 kg)  Height: 5' 4 (1.626 m)  Body mass index is 24.96 kg/m.       Physical Examination:   General appearance: alert, well appearing, and in no distress  Mental status: alert, oriented to person, place, and time  Skin: warm & dry   Cardiovascular: normal heart rate noted  Respiratory: normal respiratory effort, no distress  Abdomen: soft, non-tender   Pelvic: vulva- boil much improved, almost completely resolved  Extremities: no edema   Chaperone: Peggy Dones  No results found for this or any previous visit (from the past 24 hours).  Assessment & Plan:  1) Resolving boil on vulva s/p I&D last week> finish septra  ds as rx'd, let us  know if worsening again  Meds: No orders of the defined types were placed in this encounter.   No orders of the defined types were placed in this encounter.   Return for after 3/6 for , Pap & physical.  [name] CNM, [facility] 10/13/2024 12:05 PM

## 2024-12-01 ENCOUNTER — Other Ambulatory Visit (HOSPITAL_COMMUNITY): Payer: Self-pay | Admitting: Pharmacist

## 2025-01-27 ENCOUNTER — Ambulatory Visit (HOSPITAL_COMMUNITY): Admitting: Student in an Organized Health Care Education/Training Program

## 2025-01-27 ENCOUNTER — Encounter (HOSPITAL_COMMUNITY): Payer: Self-pay | Admitting: Student in an Organized Health Care Education/Training Program

## 2025-01-27 VITALS — BP 111/73 | HR 65 | Ht 64.0 in | Wt 143.2 lb

## 2025-01-27 DIAGNOSIS — F32A Depression, unspecified: Secondary | ICD-10-CM

## 2025-01-27 DIAGNOSIS — F411 Generalized anxiety disorder: Secondary | ICD-10-CM | POA: Diagnosis not present

## 2025-01-27 DIAGNOSIS — F410 Panic disorder [episodic paroxysmal anxiety] without agoraphobia: Secondary | ICD-10-CM | POA: Diagnosis not present

## 2025-01-27 DIAGNOSIS — R5383 Other fatigue: Secondary | ICD-10-CM | POA: Diagnosis not present

## 2025-01-27 DIAGNOSIS — F331 Major depressive disorder, recurrent, moderate: Secondary | ICD-10-CM | POA: Diagnosis not present

## 2025-01-27 DIAGNOSIS — F6381 Intermittent explosive disorder: Secondary | ICD-10-CM

## 2025-01-27 DIAGNOSIS — F17290 Nicotine dependence, other tobacco product, uncomplicated: Secondary | ICD-10-CM | POA: Diagnosis not present

## 2025-01-27 MED ORDER — DULOXETINE HCL 20 MG PO CPEP: 20.0000 mg | ORAL_CAPSULE | Freq: Every day | ORAL | 0 refills | Status: AC

## 2025-01-27 MED ORDER — FLUOXETINE HCL 20 MG PO CAPS: 20.0000 mg | ORAL_CAPSULE | Freq: Every day | ORAL | 2 refills | Status: AC

## 2025-01-27 NOTE — Patient Instructions (Addendum)
 [facility] Patient Name: Vickie Sutton Date of Visit: [01/27/25  Provider Name: Dr. Homer   Dear [name], it was a pleasure seeing you today, and we appreciate the opportunity to care for you.   These are the changes we have made to your medications:  Start fluoxetine  (prozac ) 20 mg daily You can take your Cymbalta  30 mg for 3 days, then decrease to 20 mg for 3 days then stop    Lifestyle Recommendations Sleep Hygiene: Aim for 7-9 hours of quality sleep each night. Establish a regular sleep routine and create a restful environment. Stress Management: Practice stress-reducing techniques such as mindfulness, meditation, deep breathing exercises, or hobbies that you enjoy. Smoking Cessation: If you smoke, consider quitting. We can provide resources and support to help you. Alcohol Consumption: Limit alcohol intake to moderate levels--up to one drink per day for women and up to two drinks per day for men. Routine Health Screenings: Stay up-to-date with routine health screenings and vaccinations as recommended.
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
## 2025-01-28 LAB — COMPREHENSIVE METABOLIC PANEL WITH GFR
ALT: 12 [IU]/L (ref 0–32)
AST: 22 [IU]/L (ref 0–40)
Albumin: 4.5 g/dL (ref 4.0–5.0)
Alkaline Phosphatase: 99 [IU]/L (ref 41–116)
BUN/Creatinine Ratio: 19 (ref 9–23)
BUN: 13 mg/dL (ref 6–20)
Bilirubin Total: 0.9 mg/dL (ref 0.0–1.2)
CO2: 21 mmol/L (ref 20–29)
Calcium: 9.3 mg/dL (ref 8.7–10.2)
Chloride: 104 mmol/L (ref 96–106)
Creatinine, Ser: 0.69 mg/dL (ref 0.57–1.00)
Globulin, Total: 2.8 g/dL (ref 1.5–4.5)
Glucose: 70 mg/dL (ref 70–99)
Potassium: 4.7 mmol/L (ref 3.5–5.2)
Sodium: 142 mmol/L (ref 134–144)
Total Protein: 7.3 g/dL (ref 6.0–8.5)
eGFR: 123 mL/min/{1.73_m2}

## 2025-01-28 LAB — CBC WITH DIFFERENTIAL/PLATELET
Basophils Absolute: 0 10*3/uL (ref 0.0–0.2)
Basos: 1 %
EOS (ABSOLUTE): 0.1 10*3/uL (ref 0.0–0.4)
Eos: 1 %
Hematocrit: 42 % (ref 34.0–46.6)
Hemoglobin: 13.4 g/dL (ref 11.1–15.9)
Immature Grans (Abs): 0 10*3/uL (ref 0.0–0.1)
Immature Granulocytes: 0 %
Lymphocytes Absolute: 1.1 10*3/uL (ref 0.7–3.1)
Lymphs: 18 %
MCH: 27.8 pg (ref 26.6–33.0)
MCHC: 31.9 g/dL (ref 31.5–35.7)
MCV: 87 fL (ref 79–97)
Monocytes Absolute: 0.3 10*3/uL (ref 0.1–0.9)
Monocytes: 5 %
Neutrophils Absolute: 4.4 10*3/uL (ref 1.4–7.0)
Neutrophils: 75 %
Platelets: 231 10*3/uL (ref 150–450)
RBC: 4.82 x10E6/uL (ref 3.77–5.28)
RDW: 13.6 % (ref 11.7–15.4)
WBC: 5.9 10*3/uL (ref 3.4–10.8)

## 2025-01-28 LAB — B12 AND FOLATE PANEL
Folate: 10.8 ng/mL
Vitamin B-12: 249 pg/mL (ref 232–1245)

## 2025-01-28 LAB — VITAMIN D 1,25 DIHYDROXY

## 2025-01-28 LAB — TSH: TSH: 1.31 u[IU]/mL (ref 0.450–4.500)

## 2025-01-28 NOTE — Addendum Note (Signed)
 Addended by: CARVIN CROCK on: 01/28/2025 08:38 AM   Modules accepted: Level of Service

## 2025-03-15 ENCOUNTER — Ambulatory Visit (HOSPITAL_COMMUNITY): Admitting: Student in an Organized Health Care Education/Training Program

## 2025-05-19 ENCOUNTER — Ambulatory Visit (HOSPITAL_COMMUNITY)
